# Patient Record
Sex: Female | Born: 1955
Health system: Southern US, Community
[De-identification: ages and names within clinical notes are randomized; demographics above are authoritative.]

## PROBLEM LIST (undated history)

## (undated) DIAGNOSIS — I071 Rheumatic tricuspid insufficiency: Secondary | ICD-10-CM

## (undated) DIAGNOSIS — K649 Unspecified hemorrhoids: Secondary | ICD-10-CM

## (undated) DIAGNOSIS — N9489 Other specified conditions associated with female genital organs and menstrual cycle: Secondary | ICD-10-CM

## (undated) DIAGNOSIS — E876 Hypokalemia: Secondary | ICD-10-CM

## (undated) DIAGNOSIS — E039 Hypothyroidism, unspecified: Secondary | ICD-10-CM

## (undated) DIAGNOSIS — R011 Cardiac murmur, unspecified: Secondary | ICD-10-CM

## (undated) DIAGNOSIS — R931 Abnormal findings on diagnostic imaging of heart and coronary circulation: Secondary | ICD-10-CM

## (undated) DIAGNOSIS — G459 Transient cerebral ischemic attack, unspecified: Secondary | ICD-10-CM

## (undated) DIAGNOSIS — I10 Essential (primary) hypertension: Secondary | ICD-10-CM

## (undated) DIAGNOSIS — E785 Hyperlipidemia, unspecified: Secondary | ICD-10-CM

## (undated) DIAGNOSIS — R0600 Dyspnea, unspecified: Secondary | ICD-10-CM

## (undated) DIAGNOSIS — R002 Palpitations: Secondary | ICD-10-CM

## (undated) DIAGNOSIS — F419 Anxiety disorder, unspecified: Secondary | ICD-10-CM

## (undated) DIAGNOSIS — K439 Ventral hernia without obstruction or gangrene: Secondary | ICD-10-CM

## (undated) DIAGNOSIS — G4730 Sleep apnea, unspecified: Secondary | ICD-10-CM

## (undated) HISTORY — PX: CRYOABLATION: SHX1415

## (undated) HISTORY — DX: Hyperlipidemia, unspecified: E78.5

## (undated) HISTORY — DX: Essential (primary) hypertension: I10

## (undated) HISTORY — DX: Hypokalemia: E87.6

## (undated) HISTORY — DX: Palpitations: R00.2

## (undated) HISTORY — PX: URETHRAL DILATION: SUR417

## (undated) HISTORY — PX: UMBILICAL HERNIA REPAIR: SHX196

## (undated) HISTORY — DX: Anxiety disorder, unspecified: F41.9

## (undated) HISTORY — DX: Transient cerebral ischemic attack, unspecified: G45.9

## (undated) HISTORY — DX: Abnormal findings on diagnostic imaging of heart and coronary circulation: R93.1

## (undated) HISTORY — DX: Cardiac murmur, unspecified: R01.1

---

## 2005-03-11 ENCOUNTER — Ambulatory Visit: Payer: Self-pay | Admitting: Internal Medicine

## 2005-04-06 ENCOUNTER — Ambulatory Visit: Payer: Self-pay | Admitting: Surgery

## 2005-05-11 ENCOUNTER — Ambulatory Visit: Payer: Self-pay | Admitting: Internal Medicine

## 2005-05-31 ENCOUNTER — Ambulatory Visit: Payer: Self-pay | Admitting: Obstetrics and Gynecology

## 2006-09-14 ENCOUNTER — Ambulatory Visit: Payer: Self-pay | Admitting: Internal Medicine

## 2006-09-14 ENCOUNTER — Encounter: Payer: Self-pay | Admitting: Cardiology

## 2006-11-21 ENCOUNTER — Encounter: Payer: Self-pay | Admitting: Cardiology

## 2007-03-09 ENCOUNTER — Encounter: Payer: Self-pay | Admitting: Cardiology

## 2008-06-14 HISTORY — PX: UMBILICAL HERNIA REPAIR: SHX196

## 2009-01-12 DIAGNOSIS — R931 Abnormal findings on diagnostic imaging of heart and coronary circulation: Secondary | ICD-10-CM

## 2009-01-12 DIAGNOSIS — G459 Transient cerebral ischemic attack, unspecified: Secondary | ICD-10-CM

## 2009-01-12 HISTORY — DX: Abnormal findings on diagnostic imaging of heart and coronary circulation: R93.1

## 2009-01-12 HISTORY — DX: Transient cerebral ischemic attack, unspecified: G45.9

## 2009-01-22 ENCOUNTER — Observation Stay: Payer: Self-pay | Admitting: Internal Medicine

## 2009-01-22 ENCOUNTER — Encounter: Payer: Self-pay | Admitting: Cardiology

## 2009-01-23 ENCOUNTER — Emergency Department: Payer: Self-pay | Admitting: Emergency Medicine

## 2009-01-28 ENCOUNTER — Encounter: Payer: Self-pay | Admitting: Cardiology

## 2009-01-30 ENCOUNTER — Ambulatory Visit: Payer: Self-pay | Admitting: Cardiology

## 2009-01-30 DIAGNOSIS — I10 Essential (primary) hypertension: Secondary | ICD-10-CM | POA: Insufficient documentation

## 2009-01-30 DIAGNOSIS — E785 Hyperlipidemia, unspecified: Secondary | ICD-10-CM | POA: Insufficient documentation

## 2009-01-30 DIAGNOSIS — G459 Transient cerebral ischemic attack, unspecified: Secondary | ICD-10-CM | POA: Insufficient documentation

## 2009-01-30 DIAGNOSIS — E7800 Pure hypercholesterolemia, unspecified: Secondary | ICD-10-CM

## 2009-01-30 DIAGNOSIS — I499 Cardiac arrhythmia, unspecified: Secondary | ICD-10-CM | POA: Insufficient documentation

## 2009-02-01 ENCOUNTER — Telehealth: Payer: Self-pay | Admitting: Nurse Practitioner

## 2009-02-03 ENCOUNTER — Telehealth: Payer: Self-pay | Admitting: Cardiology

## 2009-02-05 ENCOUNTER — Encounter: Payer: Self-pay | Admitting: Cardiology

## 2009-02-05 ENCOUNTER — Telehealth (INDEPENDENT_AMBULATORY_CARE_PROVIDER_SITE_OTHER): Payer: Self-pay | Admitting: Physician Assistant

## 2009-02-06 ENCOUNTER — Encounter: Payer: Self-pay | Admitting: Cardiology

## 2009-02-11 ENCOUNTER — Telehealth (INDEPENDENT_AMBULATORY_CARE_PROVIDER_SITE_OTHER): Payer: Self-pay | Admitting: *Deleted

## 2009-02-12 ENCOUNTER — Ambulatory Visit: Payer: Self-pay | Admitting: Internal Medicine

## 2009-02-14 ENCOUNTER — Telehealth: Payer: Self-pay | Admitting: Cardiology

## 2009-02-14 ENCOUNTER — Ambulatory Visit: Payer: Self-pay | Admitting: Cardiovascular Disease

## 2009-02-14 DIAGNOSIS — R232 Flushing: Secondary | ICD-10-CM

## 2009-02-21 ENCOUNTER — Emergency Department (HOSPITAL_COMMUNITY): Admission: EM | Admit: 2009-02-21 | Discharge: 2009-02-22 | Payer: Self-pay | Admitting: Emergency Medicine

## 2009-02-22 ENCOUNTER — Telehealth: Payer: Self-pay | Admitting: Adult Health

## 2009-02-25 ENCOUNTER — Telehealth: Payer: Self-pay | Admitting: Cardiology

## 2009-03-04 ENCOUNTER — Encounter: Admission: RE | Admit: 2009-03-04 | Discharge: 2009-03-04 | Payer: Self-pay | Admitting: Neurology

## 2009-03-07 ENCOUNTER — Ambulatory Visit: Payer: Self-pay

## 2009-03-07 ENCOUNTER — Encounter: Payer: Self-pay | Admitting: Cardiology

## 2009-03-10 ENCOUNTER — Ambulatory Visit: Payer: Self-pay | Admitting: Cardiology

## 2009-03-11 LAB — CONVERTED CEMR LAB
BUN: 12 mg/dL (ref 6–23)
CO2: 23 meq/L (ref 19–32)
Chloride: 103 meq/L (ref 96–112)
Creatinine, Ser: 0.77 mg/dL (ref 0.40–1.20)

## 2009-03-13 ENCOUNTER — Ambulatory Visit: Payer: Self-pay | Admitting: Internal Medicine

## 2009-03-19 ENCOUNTER — Ambulatory Visit (HOSPITAL_BASED_OUTPATIENT_CLINIC_OR_DEPARTMENT_OTHER): Admission: RE | Admit: 2009-03-19 | Discharge: 2009-03-19 | Payer: Self-pay | Admitting: Internal Medicine

## 2009-03-23 ENCOUNTER — Ambulatory Visit: Payer: Self-pay | Admitting: Internal Medicine

## 2009-03-28 ENCOUNTER — Telehealth (INDEPENDENT_AMBULATORY_CARE_PROVIDER_SITE_OTHER): Payer: Self-pay | Admitting: *Deleted

## 2010-02-11 ENCOUNTER — Ambulatory Visit: Payer: Self-pay | Admitting: Internal Medicine

## 2010-09-18 LAB — POCT I-STAT, CHEM 8
BUN: 10 mg/dL (ref 6–23)
Calcium, Ion: 1.08 mmol/L — ABNORMAL LOW (ref 1.12–1.32)
Chloride: 98 meq/L (ref 96–112)
Creatinine, Ser: 0.7 mg/dL (ref 0.4–1.2)
Glucose, Bld: 117 mg/dL — ABNORMAL HIGH (ref 70–99)
HCT: 41 % (ref 36.0–46.0)
Hemoglobin: 13.9 g/dL (ref 12.0–15.0)
Potassium: 2.8 meq/L — ABNORMAL LOW (ref 3.5–5.1)
Sodium: 136 mEq/L (ref 135–145)
TCO2: 30 mmol/L (ref 0–100)

## 2010-09-18 LAB — CBC
Hemoglobin: 13.7 g/dL (ref 12.0–15.0)
MCHC: 34.2 g/dL (ref 30.0–36.0)
MCV: 97.1 fL (ref 78.0–100.0)
RDW: 12.9 % (ref 11.5–15.5)

## 2010-09-18 LAB — URINALYSIS, ROUTINE W REFLEX MICROSCOPIC
Glucose, UA: NEGATIVE mg/dL
Hgb urine dipstick: NEGATIVE
Protein, ur: NEGATIVE mg/dL
pH: 5.5 (ref 5.0–8.0)

## 2010-09-18 LAB — DIFFERENTIAL
Basophils Absolute: 0.1 10*3/uL (ref 0.0–0.1)
Basophils Relative: 1 % (ref 0–1)
Eosinophils Absolute: 0.1 10*3/uL (ref 0.0–0.7)
Monocytes Absolute: 0.5 10*3/uL (ref 0.1–1.0)
Neutro Abs: 5 10*3/uL (ref 1.7–7.7)
Neutrophils Relative %: 63 % (ref 43–77)

## 2010-09-18 LAB — URINE MICROSCOPIC-ADD ON

## 2010-09-18 LAB — POCT CARDIAC MARKERS
CKMB, poc: <1 ng/mL — ABNORMAL LOW (ref 1.0–8.0)
Myoglobin, poc: 40.7 ng/mL (ref 12–200)
Troponin i, poc: <0.05 ng/mL (ref 0.00–0.09)

## 2010-09-18 LAB — URINE CULTURE

## 2010-11-30 ENCOUNTER — Encounter: Payer: Self-pay | Admitting: Cardiovascular Disease

## 2011-05-18 IMAGING — CR DG CHEST 2V
1 series · 2 of 2 positions shown · non-contrast
Comparison: none

REASON FOR EXAM: elevated blood pressure / blurred vision
COMMENTS:

[Series 1: view not recorded · 0.17mm/px · 2 of 2 slices shown]
[im 1/2]
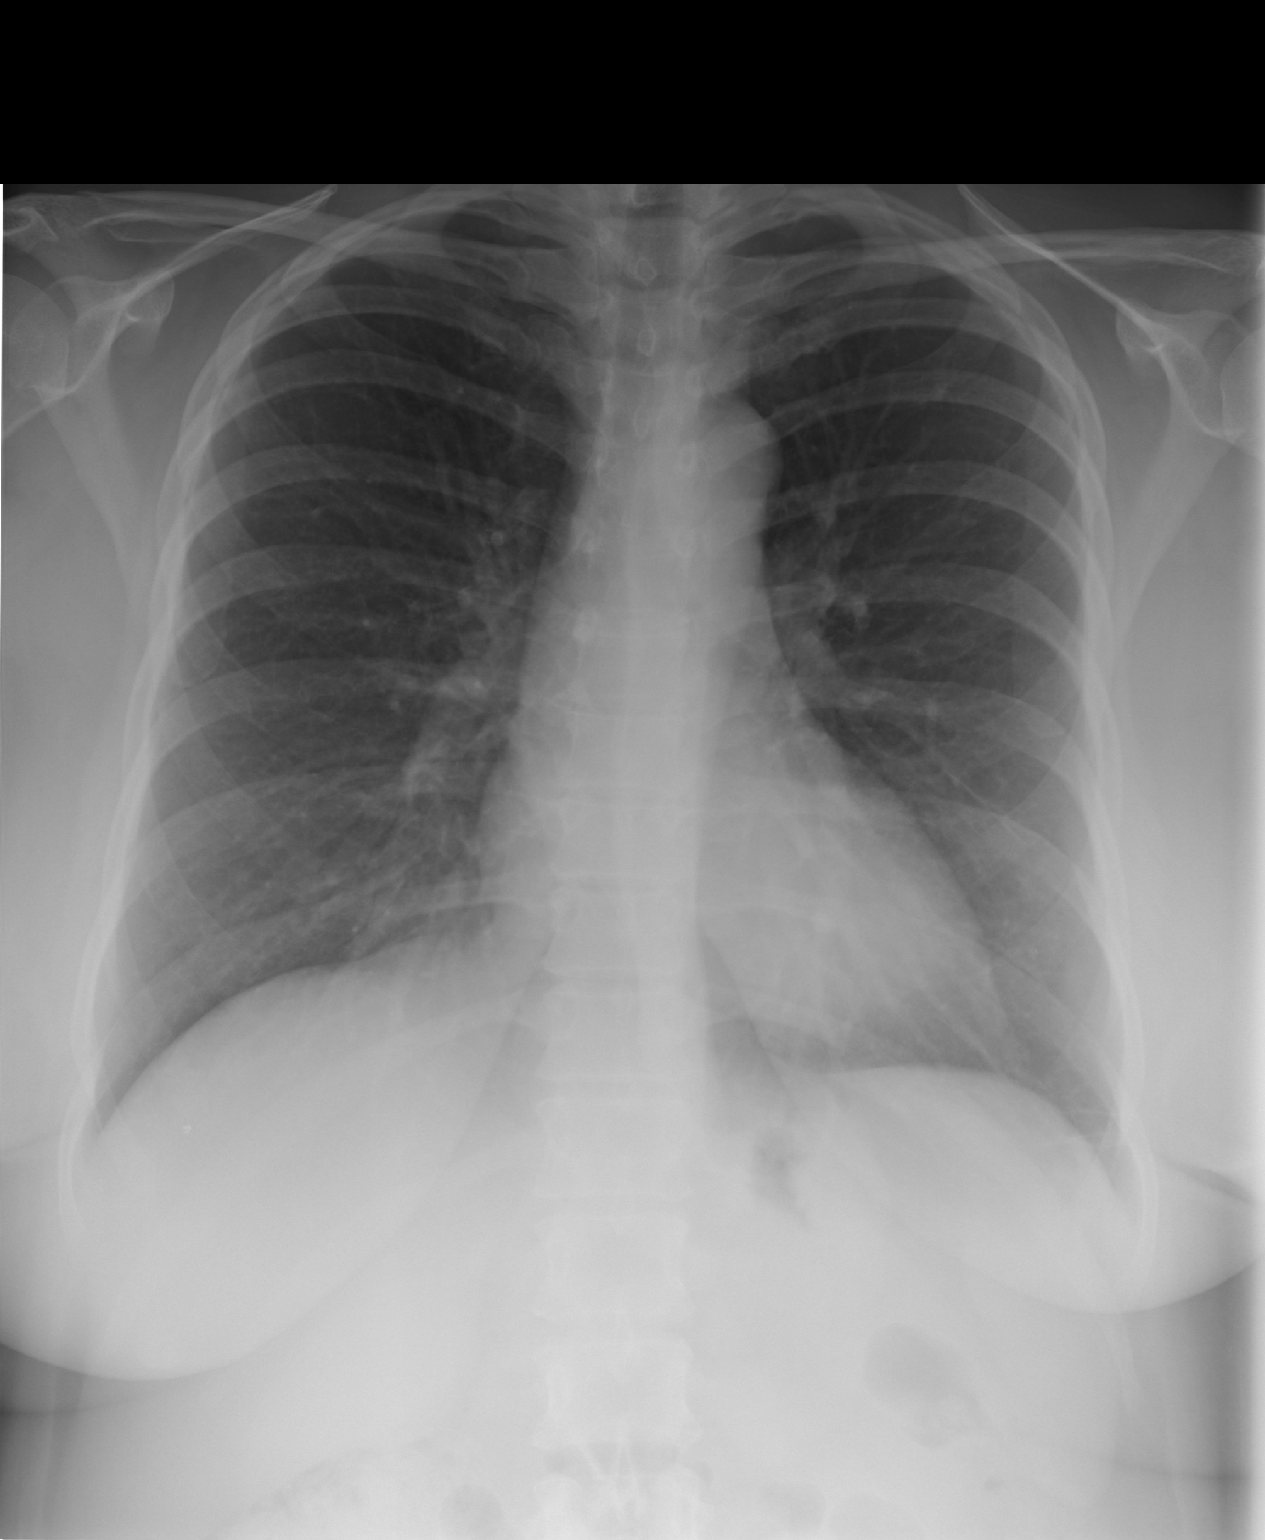
[im 2/2]
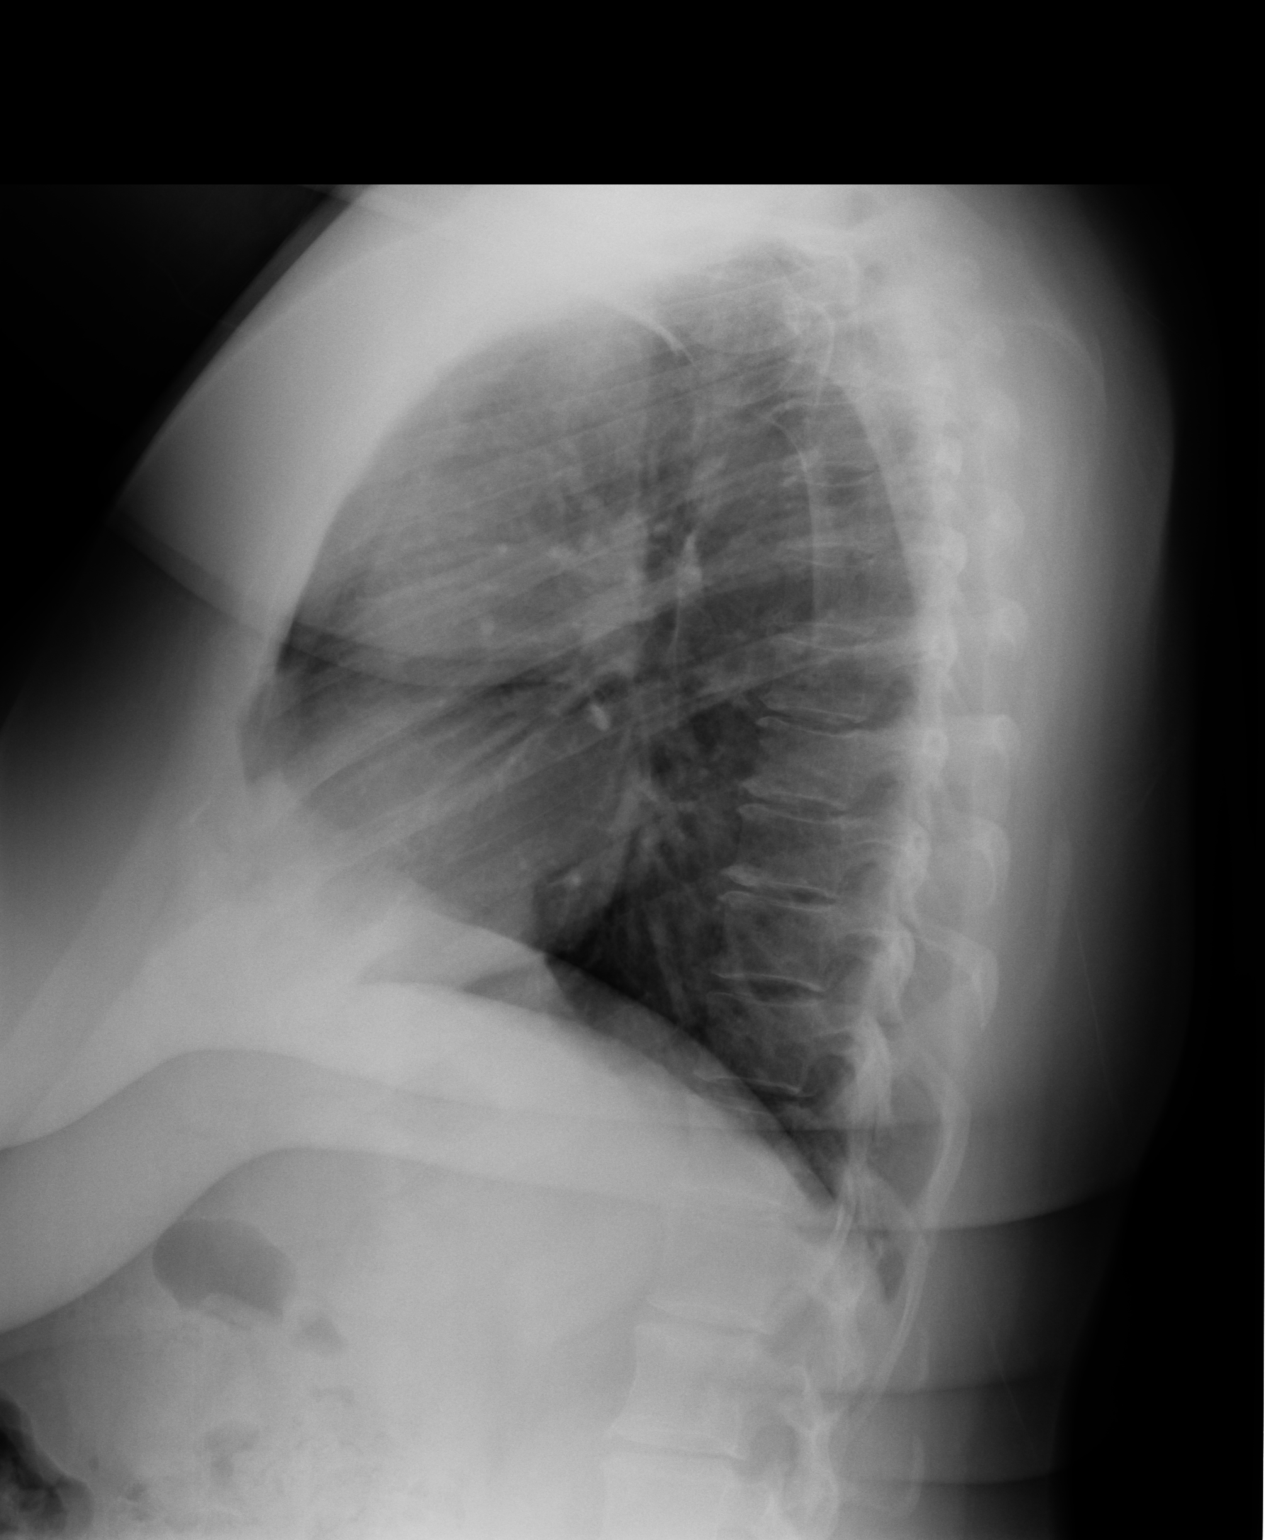

[2 of 2 positions shown; findings below may reference images not displayed]

PROCEDURE:     DXR - DXR CHEST PA (OR AP) AND LATERAL  - January 22, 2009 [DATE]

RESULT:     There is no previous exam for comparison area

The lungs are clear. The heart and pulmonary vessels are normal. The bony
and mediastinal structures are unremarkable. There is no effusion. There is
no pneumothorax or evidence of congestive failure.
IMPRESSION: No acute cardiopulmonary disease.

## 2012-06-15 ENCOUNTER — Encounter: Payer: Self-pay | Admitting: Internal Medicine

## 2012-06-15 ENCOUNTER — Ambulatory Visit (INDEPENDENT_AMBULATORY_CARE_PROVIDER_SITE_OTHER): Payer: PRIVATE HEALTH INSURANCE | Admitting: Internal Medicine

## 2012-06-15 ENCOUNTER — Other Ambulatory Visit (HOSPITAL_COMMUNITY)
Admission: RE | Admit: 2012-06-15 | Discharge: 2012-06-15 | Disposition: A | Discharge status: Home / Self Care | Payer: PRIVATE HEALTH INSURANCE | Source: Ambulatory Visit | Attending: Internal Medicine | Admitting: Internal Medicine

## 2012-06-15 VITALS — BP 120/84 | HR 77 | Temp 97.7°F | Ht 63.5 in | Wt 224.5 lb

## 2012-06-15 DIAGNOSIS — R5383 Other fatigue: Secondary | ICD-10-CM

## 2012-06-15 DIAGNOSIS — Z1151 Encounter for screening for human papillomavirus (HPV): Secondary | ICD-10-CM | POA: Insufficient documentation

## 2012-06-15 DIAGNOSIS — Z01419 Encounter for gynecological examination (general) (routine) without abnormal findings: Secondary | ICD-10-CM | POA: Insufficient documentation

## 2012-06-15 DIAGNOSIS — R5381 Other malaise: Secondary | ICD-10-CM

## 2012-06-15 DIAGNOSIS — G4730 Sleep apnea, unspecified: Secondary | ICD-10-CM

## 2012-06-15 DIAGNOSIS — Z139 Encounter for screening, unspecified: Secondary | ICD-10-CM

## 2012-06-15 DIAGNOSIS — G459 Transient cerebral ischemic attack, unspecified: Secondary | ICD-10-CM

## 2012-06-15 DIAGNOSIS — I499 Cardiac arrhythmia, unspecified: Secondary | ICD-10-CM

## 2012-06-15 DIAGNOSIS — R232 Flushing: Secondary | ICD-10-CM

## 2012-06-15 DIAGNOSIS — E785 Hyperlipidemia, unspecified: Secondary | ICD-10-CM

## 2012-06-15 DIAGNOSIS — I10 Essential (primary) hypertension: Secondary | ICD-10-CM

## 2012-06-15 LAB — COMPREHENSIVE METABOLIC PANEL
AST: 18 U/L (ref 0–37)
Albumin: 3.8 g/dL (ref 3.5–5.2)
Alkaline Phosphatase: 51 U/L (ref 39–117)
BUN: 18 mg/dL (ref 6–23)
Glucose, Bld: 118 mg/dL — ABNORMAL HIGH (ref 70–99)
Potassium: 4.2 mEq/L (ref 3.5–5.1)
Sodium: 137 mEq/L (ref 135–145)
Total Bilirubin: 0.6 mg/dL (ref 0.3–1.2)
Total Protein: 7.3 g/dL (ref 6.0–8.3)

## 2012-06-15 LAB — LIPID PANEL
Cholesterol: 239 mg/dL — ABNORMAL HIGH (ref 0–200)
HDL: 38.7 mg/dL — ABNORMAL LOW (ref >39.00)
Total CHOL/HDL Ratio: 6
VLDL: 71.8 mg/dL — ABNORMAL HIGH (ref 0.0–40.0)

## 2012-06-15 LAB — CBC WITH DIFFERENTIAL/PLATELET
Eosinophils Absolute: 0.3 10*3/uL (ref 0.0–0.7)
Eosinophils Relative: 4.7 % (ref 0.0–5.0)
HCT: 36.3 % (ref 36.0–46.0)
Lymphs Abs: 2.2 10*3/uL (ref 0.7–4.0)
MCHC: 33.9 g/dL (ref 30.0–36.0)
MCV: 94.3 fl (ref 78.0–100.0)
Monocytes Absolute: 0.5 10*3/uL (ref 0.1–1.0)
Neutrophils Relative %: 58.5 % (ref 43.0–77.0)
Platelets: 258 10*3/uL (ref 150.0–400.0)

## 2012-06-15 LAB — TSH: TSH: 3.04 u[IU]/mL (ref 0.35–5.50)

## 2012-06-16 ENCOUNTER — Telehealth: Payer: Self-pay | Admitting: Internal Medicine

## 2012-06-16 ENCOUNTER — Other Ambulatory Visit: Payer: Self-pay | Admitting: *Deleted

## 2012-06-16 ENCOUNTER — Ambulatory Visit: Payer: PRIVATE HEALTH INSURANCE

## 2012-06-16 DIAGNOSIS — Z139 Encounter for screening, unspecified: Secondary | ICD-10-CM

## 2012-06-16 DIAGNOSIS — R7989 Other specified abnormal findings of blood chemistry: Secondary | ICD-10-CM

## 2012-06-16 NOTE — Telephone Encounter (Signed)
Can I add a1c to her labs drawn 06/15/12.  a1c 790.6.  Thanks. Let me know if unable to add and I will schedule for in the future.

## 2012-06-17 ENCOUNTER — Encounter: Payer: Self-pay | Admitting: Internal Medicine

## 2012-06-17 DIAGNOSIS — G4730 Sleep apnea, unspecified: Secondary | ICD-10-CM | POA: Insufficient documentation

## 2012-06-17 NOTE — Assessment & Plan Note (Signed)
Discussed with her today regarding the importance of BiPAP regularly.  Discussed if a problem with dryness - we could discuss with company the need for reevaluation.  Follow.

## 2012-06-17 NOTE — Progress Notes (Signed)
  Subjective:    Patient ID: Margaret Mercado, female    DOB: [birth year], 56 y.o.   MRN: [id number]  HPI 57 year old female with past history of hypercholesterolemia, hypertension, sleep apnea requiring BiPAP and presumed TIA.  She comes in today to follow up on these issues as well as for a complete physical exam.  She states she has been doing well.  No chest pain or tightness.  Breathing stable.  No acid reflux.  Bowels stable.  Blood pressure has been doing well.  Just takes 1/2 of a 2.5mg  Bystolic.  Plans to get more serious about her exercise.    Past Medical History  Diagnosis Date  . Hypertension     Edema with Norvasc. Unable to tolerate Lisinopril/ HCTZ.  Marland Kitchen Hyperlipidemia   . TIA (transient ischemic attack) 01/2009    CT and MRI of head showed no evidence for stroke. Carotid dopplers showed no significant plaque  . Abnormal echocardiogram 01/2009    Mild LVH, EF >55%, no regional wall motion abnormalities, normal RV, mild aortic insufficiency, no aortic stenosis.  . Hypokalemia     Manifested by lip and finger tingling  . Anxiety   . Palpitations     Event monitor 01/2009 with no significant arrhythmias    Review of Systems Patient denies any headache, lightheadedness or dizziness.  No sinus or allergy symptoms.  No chest pain, tightness or palpitations.  No increased shortness of breath, cough or congestion.  No nausea or vomiting.  No acid reflux.  No abdominal pain or cramping.  No bowel change, such as diarrhea, constipation, BRBPR or melana.  No urine change.        Objective:   Physical Exam Filed Vitals:   06/15/12 0914  BP: 120/84  Pulse: 77  Temp: 97.7 F (36.5 C)   Blood pressure recheck:  112/72 left and 114/13 right  57 year old female in no acute distress.   HEENT:  Nares- clear.  Oropharynx - without lesions. NECK:  Supple.  Nontender.  No audible bruit.  HEART:  Appears to be regular. LUNGS:  No crackles or wheezing audible.  Respirations even and  unlabored.  RADIAL PULSE:  Equal bilaterally.    BREASTS:  No nipple discharge or nipple retraction present.  Could not appreciate any distinct nodules or axillary adenopathy.  ABDOMEN:  Soft, nontender.  Bowel sounds present and normal.  No audible abdominal bruit.  GU:  Normal external genitalia.  Vaginal vault without lesions.  Cervix identified.  Pap performed. Could not appreciate any adnexal masses or tenderness.   RECTAL:  Heme negative.   EXTREMITIES:  No increased edema present.  DP pulses palpable and equal bilaterally.          Assessment & Plan:  CARDIOVASCULAR.  Currently asymptomatic.    INCREASED PSYCHOSOCIAL STRESSORS.  On zoloft.  Doing well.  Follow.    HEALTH MAINTENANCE.  Physical today.  Pap performed.  Schedule mammogram.  IFOB.  Discussed the need for colon cancer screening.  Will notify me when agreeable for referral for colonoscopy.

## 2012-06-17 NOTE — Assessment & Plan Note (Signed)
No reoccurring problems.  Continue daily aspirin.   

## 2012-06-17 NOTE — Assessment & Plan Note (Signed)
Low cholesterol diet and exercise.  Check lipid panel.   

## 2012-06-17 NOTE — Assessment & Plan Note (Signed)
Blood pressure doing much better.  Same medication regimen.  Follow.  Check metabolic panel.  

## 2012-06-19 ENCOUNTER — Telehealth: Payer: Self-pay | Admitting: Internal Medicine

## 2012-06-19 DIAGNOSIS — E7800 Pure hypercholesterolemia, unspecified: Secondary | ICD-10-CM

## 2012-06-19 DIAGNOSIS — R739 Hyperglycemia, unspecified: Secondary | ICD-10-CM

## 2012-06-19 DIAGNOSIS — I10 Essential (primary) hypertension: Secondary | ICD-10-CM

## 2012-06-19 NOTE — Telephone Encounter (Signed)
Pt notified of lab results and need for follow up labs prior to her next appt.  Please put pt on lab schedule for 12/13/12 - 8:00.  Pt aware of appt - just need to put on schedule.

## 2012-06-20 NOTE — Telephone Encounter (Signed)
Appointment made

## 2012-07-11 ENCOUNTER — Other Ambulatory Visit: Payer: Self-pay | Admitting: *Deleted

## 2012-07-11 NOTE — Telephone Encounter (Signed)
Pt is saying she contacted her pharmacy and they have not received her rx yet.

## 2012-07-12 ENCOUNTER — Other Ambulatory Visit: Payer: Self-pay | Admitting: Internal Medicine

## 2012-07-12 MED ORDER — HYDROCHLOROTHIAZIDE 25 MG PO TABS: ORAL_TABLET | ORAL | Status: DC

## 2012-07-12 NOTE — Telephone Encounter (Signed)
Pt states [facility] on [address] has faxed over 4 times for prescription refill for hydrochlorothiazide and sertraline  and pt was told yesterday by [place] that we have sent it back.  [facility] is still saying as of today that they have not received it.  Pt is asking [place] to resend it even though we have already sent it.

## 2012-07-12 NOTE — Telephone Encounter (Signed)
Sent in to pharmacy.  

## 2012-07-13 ENCOUNTER — Ambulatory Visit: Payer: Self-pay | Admitting: Internal Medicine

## 2012-07-13 NOTE — Telephone Encounter (Signed)
Sent in to pharmacy.  

## 2012-07-25 ENCOUNTER — Encounter: Payer: Self-pay | Admitting: Internal Medicine

## 2012-08-03 ENCOUNTER — Other Ambulatory Visit: Payer: Self-pay | Admitting: *Deleted

## 2012-08-07 ENCOUNTER — Other Ambulatory Visit: Payer: Self-pay | Admitting: *Deleted

## 2012-08-07 MED ORDER — SERTRALINE HCL 25 MG PO TABS: 25.0000 mg | ORAL_TABLET | Freq: Three times a day (TID) | ORAL | Status: DC

## 2012-08-07 NOTE — Telephone Encounter (Signed)
Patient called office stating that she is completely out of Zoloft 25 mg 3/daily.

## 2012-10-23 ENCOUNTER — Other Ambulatory Visit: Payer: Self-pay | Admitting: Internal Medicine

## 2012-12-13 ENCOUNTER — Other Ambulatory Visit: Payer: PRIVATE HEALTH INSURANCE

## 2012-12-18 ENCOUNTER — Ambulatory Visit: Payer: PRIVATE HEALTH INSURANCE | Admitting: Internal Medicine

## 2012-12-19 ENCOUNTER — Ambulatory Visit: Payer: PRIVATE HEALTH INSURANCE | Admitting: Internal Medicine

## 2012-12-20 ENCOUNTER — Ambulatory Visit: Payer: PRIVATE HEALTH INSURANCE | Admitting: Internal Medicine

## 2013-02-09 ENCOUNTER — Ambulatory Visit: Payer: PRIVATE HEALTH INSURANCE | Admitting: Internal Medicine

## 2013-03-01 ENCOUNTER — Ambulatory Visit: Payer: Self-pay | Admitting: Internal Medicine

## 2013-03-22 ENCOUNTER — Other Ambulatory Visit: Payer: Self-pay | Admitting: Internal Medicine

## 2013-03-22 NOTE — Telephone Encounter (Signed)
appt 03/28/13

## 2013-03-28 ENCOUNTER — Ambulatory Visit: Payer: Self-pay | Admitting: Internal Medicine

## 2013-04-18 ENCOUNTER — Ambulatory Visit: Payer: Self-pay | Admitting: Internal Medicine

## 2013-05-02 ENCOUNTER — Ambulatory Visit: Payer: Self-pay | Admitting: Internal Medicine

## 2013-05-23 ENCOUNTER — Ambulatory Visit: Payer: Self-pay | Admitting: Internal Medicine

## 2013-06-01 ENCOUNTER — Ambulatory Visit: Payer: Self-pay | Admitting: Internal Medicine

## 2013-06-14 ENCOUNTER — Other Ambulatory Visit: Payer: Self-pay | Admitting: Internal Medicine

## 2013-06-15 NOTE — Telephone Encounter (Signed)
Pt has appt 07/06/13, but has cancelled 7 appointments and 1 no show. Last OV 06/15/12

## 2013-06-15 NOTE — Telephone Encounter (Signed)
I refilled her zoloft x 1 only.  She needs to keep her 07/08/13 appt.  Thanks.

## 2013-07-03 ENCOUNTER — Other Ambulatory Visit: Payer: Self-pay | Admitting: Internal Medicine

## 2013-07-03 ENCOUNTER — Telehealth: Payer: Self-pay | Admitting: Internal Medicine

## 2013-07-03 DIAGNOSIS — R739 Hyperglycemia, unspecified: Secondary | ICD-10-CM

## 2013-07-03 DIAGNOSIS — E785 Hyperlipidemia, unspecified: Secondary | ICD-10-CM

## 2013-07-03 DIAGNOSIS — I10 Essential (primary) hypertension: Secondary | ICD-10-CM

## 2013-07-03 NOTE — Progress Notes (Signed)
Labs ordered.

## 2013-07-03 NOTE — Telephone Encounter (Signed)
I have placed orders for the labs.  It has been over one year since she has been seen.  Needs labs within the next 1-2 weeks.   Thanks.

## 2013-07-03 NOTE — Telephone Encounter (Signed)
Pt call to r/s her appointment to 3/5 and stated she needed to get labs prior to the appointment is this ok there are no lab orders in system

## 2013-07-05 NOTE — Telephone Encounter (Signed)
Left message for pt to call office

## 2013-07-06 ENCOUNTER — Ambulatory Visit: Payer: Self-pay | Admitting: Internal Medicine

## 2013-07-10 NOTE — Telephone Encounter (Signed)
Left message on cell phone for pt to call office °

## 2013-07-27 NOTE — Telephone Encounter (Signed)
Appointment 08/10/13  Mailed appointment to pt

## 2013-08-07 ENCOUNTER — Other Ambulatory Visit: Payer: Self-pay | Admitting: Internal Medicine

## 2013-08-07 NOTE — Telephone Encounter (Signed)
Please call pt and make sure she is aware that if she does not keep this next appt - no more refills.  Only refill x 1.

## 2013-08-07 NOTE — Telephone Encounter (Signed)
Pt canceled last 8 appointments, including 07/06/13 visit that had been scheduled for refills. Refill? Appt now 08/16/13

## 2013-08-08 NOTE — Telephone Encounter (Signed)
Home number, mailbox full. Cell number, mailbox full, unable to leave VM on either number. Mailed letter, notifying of need to keep upcoming appointment.

## 2013-08-10 ENCOUNTER — Other Ambulatory Visit: Payer: PRIVATE HEALTH INSURANCE

## 2013-08-15 ENCOUNTER — Other Ambulatory Visit (INDEPENDENT_AMBULATORY_CARE_PROVIDER_SITE_OTHER): Payer: PRIVATE HEALTH INSURANCE

## 2013-08-15 DIAGNOSIS — I10 Essential (primary) hypertension: Secondary | ICD-10-CM

## 2013-08-15 DIAGNOSIS — E785 Hyperlipidemia, unspecified: Secondary | ICD-10-CM

## 2013-08-15 DIAGNOSIS — Z139 Encounter for screening, unspecified: Secondary | ICD-10-CM

## 2013-08-15 DIAGNOSIS — R7309 Other abnormal glucose: Secondary | ICD-10-CM | POA: Diagnosis not present

## 2013-08-15 DIAGNOSIS — R739 Hyperglycemia, unspecified: Secondary | ICD-10-CM

## 2013-08-15 LAB — URINALYSIS, ROUTINE W REFLEX MICROSCOPIC
Bilirubin Urine: NEGATIVE
Hgb urine dipstick: NEGATIVE
Ketones, ur: NEGATIVE
NITRITE: NEGATIVE
Total Protein, Urine: NEGATIVE
URINE GLUCOSE: NEGATIVE
UROBILINOGEN UA: 0.2 (ref 0.0–1.0)
pH: 5.5 (ref 5.0–8.0)

## 2013-08-15 LAB — CBC WITH DIFFERENTIAL/PLATELET
Basophils Absolute: 0 10*3/uL (ref 0.0–0.1)
Basophils Relative: 0.7 % (ref 0.0–3.0)
EOS ABS: 0.2 10*3/uL (ref 0.0–0.7)
Eosinophils Relative: 3.6 % (ref 0.0–5.0)
HCT: 39 % (ref 36.0–46.0)
Hemoglobin: 13 g/dL (ref 12.0–15.0)
Lymphocytes Relative: 28.7 % (ref 12.0–46.0)
Lymphs Abs: 1.8 10*3/uL (ref 0.7–4.0)
MCHC: 33.4 g/dL (ref 30.0–36.0)
MCV: 95.7 fl (ref 78.0–100.0)
MONO ABS: 0.4 10*3/uL (ref 0.1–1.0)
Monocytes Relative: 7.1 % (ref 3.0–12.0)
NEUTROS PCT: 59.9 % (ref 43.0–77.0)
Neutro Abs: 3.7 10*3/uL (ref 1.4–7.7)
Platelets: 253 10*3/uL (ref 150.0–400.0)
RBC: 4.07 Mil/uL (ref 3.87–5.11)
RDW: 13.3 % (ref 11.5–14.6)
WBC: 6.2 10*3/uL (ref 4.5–10.5)

## 2013-08-15 LAB — LIPID PANEL
CHOL/HDL RATIO: 6
Cholesterol: 232 mg/dL — ABNORMAL HIGH (ref 0–200)
HDL: 40.9 mg/dL (ref >39.00)
LDL CALC: 141 mg/dL — AB (ref 0–99)
TRIGLYCERIDES: 252 mg/dL — AB (ref 0.0–149.0)
VLDL: 50.4 mg/dL — ABNORMAL HIGH (ref 0.0–40.0)

## 2013-08-15 LAB — COMPREHENSIVE METABOLIC PANEL
ALBUMIN: 3.9 g/dL (ref 3.5–5.2)
ALT: 20 U/L (ref 0–35)
AST: 17 U/L (ref 0–37)
Alkaline Phosphatase: 48 U/L (ref 39–117)
BUN: 16 mg/dL (ref 6–23)
CO2: 25 mEq/L (ref 19–32)
Calcium: 9.2 mg/dL (ref 8.4–10.5)
Chloride: 103 mEq/L (ref 96–112)
Creatinine, Ser: 0.7 mg/dL (ref 0.4–1.2)
GFR: 87.56 mL/min (ref >60.00)
Glucose, Bld: 108 mg/dL — ABNORMAL HIGH (ref 70–99)
POTASSIUM: 4 meq/L (ref 3.5–5.1)
SODIUM: 138 meq/L (ref 135–145)
Total Bilirubin: 0.8 mg/dL (ref 0.3–1.2)
Total Protein: 6.9 g/dL (ref 6.0–8.3)

## 2013-08-15 LAB — MICROALBUMIN / CREATININE URINE RATIO
CREATININE, U: 145.1 mg/dL
Microalb Creat Ratio: 0.9 mg/g (ref 0.0–30.0)
Microalb, Ur: 1.3 mg/dL (ref 0.0–1.9)

## 2013-08-15 LAB — TSH: TSH: 3.86 u[IU]/mL (ref 0.35–5.50)

## 2013-08-15 LAB — HEMOGLOBIN A1C: Hgb A1c MFr Bld: 6.2 % (ref 4.6–6.5)

## 2013-08-16 ENCOUNTER — Encounter: Payer: Self-pay | Admitting: Internal Medicine

## 2013-08-16 ENCOUNTER — Ambulatory Visit (INDEPENDENT_AMBULATORY_CARE_PROVIDER_SITE_OTHER): Payer: PRIVATE HEALTH INSURANCE | Admitting: Internal Medicine

## 2013-08-16 VITALS — BP 130/78 | HR 69 | Temp 97.9°F | Ht 63.5 in | Wt 222.2 lb

## 2013-08-16 DIAGNOSIS — Z1239 Encounter for other screening for malignant neoplasm of breast: Secondary | ICD-10-CM

## 2013-08-16 DIAGNOSIS — R1032 Left lower quadrant pain: Secondary | ICD-10-CM

## 2013-08-16 DIAGNOSIS — K439 Ventral hernia without obstruction or gangrene: Secondary | ICD-10-CM

## 2013-08-16 DIAGNOSIS — G4730 Sleep apnea, unspecified: Secondary | ICD-10-CM

## 2013-08-16 DIAGNOSIS — R8281 Pyuria: Secondary | ICD-10-CM

## 2013-08-16 DIAGNOSIS — E785 Hyperlipidemia, unspecified: Secondary | ICD-10-CM

## 2013-08-16 DIAGNOSIS — I10 Essential (primary) hypertension: Secondary | ICD-10-CM

## 2013-08-16 DIAGNOSIS — R82998 Other abnormal findings in urine: Secondary | ICD-10-CM

## 2013-08-16 DIAGNOSIS — G459 Transient cerebral ischemic attack, unspecified: Secondary | ICD-10-CM

## 2013-08-16 NOTE — Progress Notes (Signed)
Pre-visit discussion using our clinic review tool. No additional management support is needed unless otherwise documented below in the visit note.  

## 2013-08-16 NOTE — Progress Notes (Signed)
Subjective:    Patient ID: Margaret Mercado, female    DOB: 11/20/1955, 58 y.o.   MRN: [id number]  HPI 58 year old female with past history of hypercholesterolemia, hypertension, sleep apnea requiring BiPAP and presumed TIA.  She comes in today for a scheduled follow up.  She states she has been doing well.  No chest pain or tightness.  Breathing stable.  No acid reflux.  Bowels stable.  Blood pressure has been doing well.  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  Plans to get more serious about her exercise.  She reports increased stress.  Feels she is handling things relatively well.  Taking zoloft.  Also taking red yeast rice for her cholesterol.  Does not want to take prescription medication.  She is also concerned regarding an abdominal bulge.  Persistent.  Feels bigger.  Would like evaluated.  Having some left lower quadrant pain.  Persistent.  Has had (per her report) an ovarian cyst in the past.  No bowel change.     Past Medical History  Diagnosis Date  . Hypertension     Edema with Norvasc. Unable to tolerate Lisinopril/ HCTZ.  Marland Kitchen Hyperlipidemia   . TIA (transient ischemic attack) 01/2009    CT and MRI of head showed no evidence for stroke. Carotid dopplers showed no significant plaque  . Abnormal echocardiogram 01/2009    Mild LVH, EF >55%, no regional wall motion abnormalities, normal RV, mild aortic insufficiency, no aortic stenosis.  . Hypokalemia     Manifested by lip and finger tingling  . Anxiety   . Palpitations     Event monitor 01/2009 with no significant arrhythmias    Current Outpatient Prescriptions on File Prior to Visit  Medication Sig Dispense Refill  . aspirin 81 MG EC tablet Take 81 mg by mouth daily.        Marland Kitchen BYSTOLIC 2.5 MG tablet TAKE 1 TABLET BY MOUTH EVERY DAY  30 tablet  5  . fish oil-omega-3 fatty acids 1000 MG capsule Take 0.75 capsules by mouth daily.        . hydrochlorothiazide (HYDRODIURIL) 25 MG tablet Take 1/2 tablet by mouth daily  15 tablet  5  . LORazepam  (ATIVAN) 1 MG tablet Take 0.5 mg by mouth at bedtime as needed.       . nebivolol (BYSTOLIC) 5 MG tablet Take 0.5 tablets (2.5 mg total) by mouth daily. Must keep 08/16/13 appointment for any further refills  15 tablet  0  . sertraline (ZOLOFT) 25 MG tablet TAKE 1 TABLET BY MOUTH THREE TIMES DAILY  90 tablet  0   No current facility-administered medications on file prior to visit.    Review of Systems Patient denies any headache, lightheadedness or dizziness.  No sinus or allergy symptoms.  No chest pain, tightness or palpitations.  No increased shortness of breath, cough or congestion.  No nausea or vomiting.  No acid reflux.  Left lower quadrant pain as outlined.   No bowel change, such as diarrhea, constipation, BRBPR or melana.  Some increased urinary frequency.       Objective:   Physical Exam  Filed Vitals:   08/16/13 1359  BP: 130/78  Pulse: 69  Temp: 97.9 F (36.6 C)   Blood pressure recheck:  55/56  58 year old female in no acute distress.   HEENT:  Nares- clear.  Oropharynx - without lesions. NECK:  Supple.  Nontender.  No audible bruit.  HEART:  Appears to be regular. LUNGS:  No crackles  or wheezing audible.  Respirations even and unlabored.  RADIAL PULSE:  Equal bilaterally.    ABDOMEN:  Soft.  Minimal tenderness to palpation over the left lower quadrant.   Bowel sounds present and normal.  No audible abdominal bruit.  Changes c/w hernia.  Non tender.    EXTREMITIES:  No increased edema present.  DP pulses palpable and equal bilaterally.          Assessment & Plan:  CARDIOVASCULAR.  Currently asymptomatic.    INCREASED PSYCHOSOCIAL STRESSORS.  On zoloft.   Follow.    HEALTH MAINTENANCE.  Overdue a physical.   Schedule.  Schedule mammogram.  Have discussed the need for colon cancer screening.  Will notify me when agreeable for referral for colonoscopy.

## 2013-08-17 ENCOUNTER — Other Ambulatory Visit: Payer: Self-pay | Admitting: Internal Medicine

## 2013-08-17 LAB — CULTURE, URINE COMPREHENSIVE
COLONY COUNT: NO GROWTH
ORGANISM ID, BACTERIA: NO GROWTH

## 2013-08-19 ENCOUNTER — Encounter: Payer: Self-pay | Admitting: Internal Medicine

## 2013-08-19 DIAGNOSIS — R8281 Pyuria: Secondary | ICD-10-CM | POA: Insufficient documentation

## 2013-08-19 DIAGNOSIS — K439 Ventral hernia without obstruction or gangrene: Secondary | ICD-10-CM | POA: Insufficient documentation

## 2013-08-19 DIAGNOSIS — R1032 Left lower quadrant pain: Secondary | ICD-10-CM | POA: Insufficient documentation

## 2013-08-19 MED ORDER — SERTRALINE HCL 25 MG PO TABS: ORAL_TABLET | ORAL | Status: DC

## 2013-08-19 MED ORDER — NEBIVOLOL HCL 2.5 MG PO TABS
ORAL_TABLET | ORAL | Status: DC
Start: 2013-08-19 — End: 2014-04-03

## 2013-08-19 MED ORDER — HYDROCHLOROTHIAZIDE 25 MG PO TABS: ORAL_TABLET | ORAL | Status: DC

## 2013-08-19 NOTE — Assessment & Plan Note (Signed)
Blood pressure doing much better.  Same medication regimen.  Follow.  Check metabolic panel.  

## 2013-08-19 NOTE — Assessment & Plan Note (Signed)
Have discussed with her regarding the importance of BiPAP.  Follow.    

## 2013-08-19 NOTE — Assessment & Plan Note (Signed)
No reoccurring problems.  Continue daily aspirin.   

## 2013-08-19 NOTE — Assessment & Plan Note (Signed)
Low cholesterol diet and exercise.  Follow lipid panel.  Most recent check 08/15/13 - triglycerides 252 and LDL 141.  She declines rx medication.  Taking red yeast rice.  Wants to work on diet and exercise.

## 2013-08-19 NOTE — Assessment & Plan Note (Signed)
Check urine culture  

## 2013-08-19 NOTE — Assessment & Plan Note (Signed)
Persistent left lower quadrant pain.  Will check pelvic ultrasound.

## 2013-08-19 NOTE — Assessment & Plan Note (Signed)
Concern regarding hernia.  Will refer to Dr Tamala Julian for evaluation.

## 2013-08-22 ENCOUNTER — Encounter: Payer: Self-pay | Admitting: Emergency Medicine

## 2013-11-16 ENCOUNTER — Encounter: Payer: PRIVATE HEALTH INSURANCE | Admitting: Internal Medicine

## 2013-11-26 ENCOUNTER — Encounter: Payer: PRIVATE HEALTH INSURANCE | Admitting: Internal Medicine

## 2014-02-20 ENCOUNTER — Encounter: Payer: PRIVATE HEALTH INSURANCE | Admitting: Internal Medicine

## 2014-03-07 ENCOUNTER — Other Ambulatory Visit: Payer: Self-pay | Admitting: Internal Medicine

## 2014-03-07 NOTE — Telephone Encounter (Signed)
Appt 04/03/14

## 2014-03-19 ENCOUNTER — Ambulatory Visit: Payer: Self-pay | Admitting: Internal Medicine

## 2014-03-19 LAB — HM MAMMOGRAPHY: HM MAMMO: NEGATIVE

## 2014-03-20 ENCOUNTER — Encounter: Payer: Self-pay | Admitting: *Deleted

## 2014-04-03 ENCOUNTER — Ambulatory Visit (INDEPENDENT_AMBULATORY_CARE_PROVIDER_SITE_OTHER): Payer: PRIVATE HEALTH INSURANCE | Admitting: Internal Medicine

## 2014-04-03 ENCOUNTER — Encounter: Payer: Self-pay | Admitting: Internal Medicine

## 2014-04-03 VITALS — BP 100/70 | HR 69 | Temp 97.9°F | Ht 62.0 in | Wt 220.8 lb

## 2014-04-03 DIAGNOSIS — G459 Transient cerebral ischemic attack, unspecified: Secondary | ICD-10-CM

## 2014-04-03 DIAGNOSIS — E7800 Pure hypercholesterolemia, unspecified: Secondary | ICD-10-CM

## 2014-04-03 DIAGNOSIS — G4730 Sleep apnea, unspecified: Secondary | ICD-10-CM | POA: Diagnosis not present

## 2014-04-03 DIAGNOSIS — I10 Essential (primary) hypertension: Secondary | ICD-10-CM

## 2014-04-03 DIAGNOSIS — L989 Disorder of the skin and subcutaneous tissue, unspecified: Secondary | ICD-10-CM

## 2014-04-03 DIAGNOSIS — R739 Hyperglycemia, unspecified: Secondary | ICD-10-CM

## 2014-04-03 DIAGNOSIS — K439 Ventral hernia without obstruction or gangrene: Secondary | ICD-10-CM

## 2014-04-03 LAB — LIPID PANEL
Cholesterol: 250 mg/dL — ABNORMAL HIGH (ref 0–200)
HDL: 45 mg/dL (ref >39.00)
NONHDL: 205
Total CHOL/HDL Ratio: 6
Triglycerides: 257 mg/dL — ABNORMAL HIGH (ref 0.0–149.0)
VLDL: 51.4 mg/dL — ABNORMAL HIGH (ref 0.0–40.0)

## 2014-04-03 LAB — COMPREHENSIVE METABOLIC PANEL
ALK PHOS: 55 U/L (ref 39–117)
ALT: 25 U/L (ref 0–35)
AST: 23 U/L (ref 0–37)
Albumin: 3.7 g/dL (ref 3.5–5.2)
BUN: 13 mg/dL (ref 6–23)
CO2: 32 meq/L (ref 19–32)
Calcium: 9.9 mg/dL (ref 8.4–10.5)
Chloride: 100 mEq/L (ref 96–112)
Creatinine, Ser: 0.8 mg/dL (ref 0.4–1.2)
GFR: 82.15 mL/min (ref >60.00)
GLUCOSE: 110 mg/dL — AB (ref 70–99)
POTASSIUM: 4.3 meq/L (ref 3.5–5.1)
SODIUM: 138 meq/L (ref 135–145)
TOTAL PROTEIN: 7.3 g/dL (ref 6.0–8.3)
Total Bilirubin: 0.6 mg/dL (ref 0.2–1.2)

## 2014-04-03 LAB — HEMOGLOBIN A1C: Hgb A1c MFr Bld: 6 % (ref 4.6–6.5)

## 2014-04-03 MED ORDER — HYDROCHLOROTHIAZIDE 25 MG PO TABS: ORAL_TABLET | ORAL | Status: DC

## 2014-04-03 MED ORDER — NEBIVOLOL HCL 2.5 MG PO TABS: ORAL_TABLET | ORAL | Status: DC

## 2014-04-03 NOTE — Progress Notes (Signed)
Subjective:    Patient ID: Margaret Mercado, female    DOB: 1955-12-30, 58 y.o.   MRN: [id number]  HPI 58 year old female with past history of hypercholesterolemia, hypertension, sleep apnea requiring BiPAP and presumed TIA.  She comes in today to follow up on these issues as well as for a complete physical exam.   She states she has been doing well.  No chest pain or tightness.  Breathing stable.  No acid reflux.  Concern regarding  - hernia.  Bowels stable.  Saw Dr Tamala Julian previously.  Desires no further intervention.  Blood pressure has been doing well.  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  Plans to get more serious about her exercise.  She reports increased stress.  Feels she is handling things relatively well.  Taking zoloft.     Past Medical History  Diagnosis Date  . Hypertension     Edema with Norvasc. Unable to tolerate Lisinopril/ HCTZ.  Marland Kitchen Hyperlipidemia   . TIA (transient ischemic attack) 01/2009    CT and MRI of head showed no evidence for stroke. Carotid dopplers showed no significant plaque  . Abnormal echocardiogram 01/2009    Mild LVH, EF >55%, no regional wall motion abnormalities, normal RV, mild aortic insufficiency, no aortic stenosis.  . Hypokalemia     Manifested by lip and finger tingling  . Anxiety   . Palpitations     Event monitor 01/2009 with no significant arrhythmias    Current Outpatient Prescriptions on File Prior to Visit  Medication Sig Dispense Refill  . aspirin 81 MG EC tablet Take 81 mg by mouth daily.        . fish oil-omega-3 fatty acids 1000 MG capsule Take 0.75 capsules by mouth daily.        . hydrochlorothiazide (HYDRODIURIL) 25 MG tablet Take 1/2 tablet by mouth daily  30 tablet  3  . LORazepam (ATIVAN) 1 MG tablet Take 0.5 mg by mouth at bedtime as needed.       . nebivolol (BYSTOLIC) 2.5 MG tablet 1/2 tablet q day  30 tablet  3  . sertraline (ZOLOFT) 25 MG tablet TAKE 3 TABLETS BY MOUTH EVERY DAY  90 tablet  2   No current facility-administered  medications on file prior to visit.    Review of Systems Patient denies any headache, lightheadedness or dizziness.  No sinus or allergy symptoms.  No chest pain, tightness or palpitations.  No increased shortness of breath, cough or congestion.  No nausea or vomiting.  No acid reflux.  No bowel change, such as diarrhea, constipation, BRBPR or melana.  Concern over hernia as outlined.  Previously saw Dr Tamala Julian.  Discussed diet and exercise.       Objective:   Physical Exam  Filed Vitals:   04/03/14 0840  BP: 100/70  Pulse: 69  Temp: 97.9 F (36.6 C)   Blood pressure recheck:  118/68 (relatively equal - both arms)  58 year old female in no acute distress.   HEENT:  Nares- clear.  Oropharynx - without lesions. NECK:  Supple.  Nontender.  No audible bruit.  HEART:  Appears to be regular. LUNGS:  No crackles or wheezing audible.  Respirations even and unlabored.  RADIAL PULSE:  Equal bilaterally.    BREASTS:  No nipple discharge or nipple retraction present.  Could not appreciate any distinct nodules or axillary adenopathy.  ABDOMEN:  Soft, nontender.  Bowel sounds present and normal.  No audible abdominal bruit.   EXTREMITIES:  No increased  edema present.  DP pulses palpable and equal bilaterally. FEET:  No lesions.   SKIN:  Persistent breast lesion.            Assessment & Plan:  CARDIOVASCULAR.  Currently asymptomatic.    INCREASED PSYCHOSOCIAL STRESSORS.  On zoloft.   Follow.    HEALTH MAINTENANCE.  Physical today.   Mammogram 03/19/14 - [facility] I.  Have discussed the need for colon cancer screening.  Will notify me when agreeable for referral for colonoscopy.    Problem List Items Addressed This Visit   Essential hypertension - Primary     Blood pressure doing much better.  Same medication regimen.  Follow.  Check metabolic panel.     Relevant Medications      nebivolol (BYSTOLIC) tablet      hydrochlorothiazide tablet   Other Relevant Orders      Comprehensive metabolic  panel (Completed)   Hernia of abdominal wall     Saw Dr Tamala Julian previously.  Desires no further intervention at this time.  Follow.      Hypercholesterolemia     Low cholesterol diet and exercise.  Follow lipid panel.   Wants to work on diet and exercise.  Discussed diet and exercise today.       Relevant Medications      nebivolol (BYSTOLIC) tablet      hydrochlorothiazide tablet   Other Relevant Orders      Lipid panel (Completed)   Severe obesity (BMI >= 40)     Discussed diet, exercise and weight loss.       Sleep apnea     Have discussed with her regarding the importance of BiPAP.  Follow.       Transient cerebral ischemia     No reoccurring problems.  Continue daily aspirin.      Relevant Medications      nebivolol (BYSTOLIC) tablet      hydrochlorothiazide tablet    Other Visit Diagnoses   Hyperglycemia        Relevant Orders       Hemoglobin A1c (Completed)    Skin lesion        Relevant Orders       Ambulatory referral to Dermatology      I spent 25 minutes with the patient and more than 50% of the time was spent in consultation regarding the above.

## 2014-04-03 NOTE — Progress Notes (Signed)
Pre visit review using our clinic review tool, if applicable. No additional management support is needed unless otherwise documented below in the visit note. 

## 2014-04-04 ENCOUNTER — Encounter: Payer: Self-pay | Admitting: *Deleted

## 2014-04-04 LAB — LDL CHOLESTEROL, DIRECT: LDL DIRECT: 170 mg/dL

## 2014-04-07 NOTE — Assessment & Plan Note (Signed)
Low cholesterol diet and exercise.  Follow lipid panel.   Wants to work on diet and exercise.  Discussed diet and exercise today.

## 2014-04-07 NOTE — Assessment & Plan Note (Signed)
Discussed diet, exercise and weight loss

## 2014-04-07 NOTE — Assessment & Plan Note (Signed)
Blood pressure doing much better.  Same medication regimen.  Follow.  Check metabolic panel.

## 2014-04-07 NOTE — Assessment & Plan Note (Signed)
Saw Dr Tamala Julian previously.  Desires no further intervention at this time.  Follow.

## 2014-04-07 NOTE — Assessment & Plan Note (Signed)
Have discussed with her regarding the importance of BiPAP.  Follow.

## 2014-04-07 NOTE — Assessment & Plan Note (Signed)
No reoccurring problems.  Continue daily aspirin.

## 2014-08-07 ENCOUNTER — Other Ambulatory Visit: Payer: Self-pay | Admitting: Internal Medicine

## 2014-08-07 ENCOUNTER — Other Ambulatory Visit: Payer: Self-pay | Admitting: *Deleted

## 2014-09-04 ENCOUNTER — Telehealth: Payer: Self-pay | Admitting: *Deleted

## 2014-09-04 NOTE — Telephone Encounter (Signed)
Patient called to inform you that her mother Margaret Mercado passed away yesterday & she has been asked by her father to do her mothers hair. Patient has been on Lorazepam about a year ago & would like to know if you would be willing to give her a few to help her handle the next few days. We may reach her on her cell or home number.

## 2014-09-05 MED ORDER — LORAZEPAM 0.5 MG PO TABS: ORAL_TABLET | ORAL | Status: DC

## 2014-09-05 NOTE — Telephone Encounter (Signed)
rx ok'd for lorazepam #20 with no refills.  Let her know - let us know if she need anything.

## 2014-09-05 NOTE — Telephone Encounter (Signed)
Left voicemail notifying patient that Rx was faxed to pharmacy

## 2014-10-03 ENCOUNTER — Other Ambulatory Visit: Payer: Self-pay | Admitting: Internal Medicine

## 2014-10-03 ENCOUNTER — Ambulatory Visit: Payer: Self-pay | Admitting: Internal Medicine

## 2014-10-03 DIAGNOSIS — Z0289 Encounter for other administrative examinations: Secondary | ICD-10-CM

## 2014-10-11 ENCOUNTER — Other Ambulatory Visit: Payer: Self-pay | Admitting: *Deleted

## 2014-10-11 MED ORDER — SERTRALINE HCL 25 MG PO TABS: 75.0000 mg | ORAL_TABLET | Freq: Every day | ORAL | Status: DC

## 2014-10-11 MED ORDER — NEBIVOLOL HCL 2.5 MG PO TABS: ORAL_TABLET | ORAL | Status: DC

## 2014-12-05 ENCOUNTER — Other Ambulatory Visit: Payer: Self-pay | Admitting: Internal Medicine

## 2014-12-05 ENCOUNTER — Telehealth: Payer: Self-pay | Admitting: *Deleted

## 2014-12-05 MED ORDER — SERTRALINE HCL 25 MG PO TABS: 75.0000 mg | ORAL_TABLET | Freq: Every day | ORAL | Status: DC

## 2014-12-05 NOTE — Telephone Encounter (Signed)
Pt aware.

## 2014-12-05 NOTE — Telephone Encounter (Signed)
I sent in one refill to [facility].  She needs to keep her f/u appt.

## 2014-12-05 NOTE — Telephone Encounter (Signed)
Pt called requesting Zoloft refill.  Last OV 10.21.15, last refill 4.21.16, next OV 7.7.16.  Please advise refill

## 2014-12-05 NOTE — Telephone Encounter (Signed)
Last OV 10.21.15, next OV 7.7.16.  Please advise refill

## 2014-12-06 NOTE — Telephone Encounter (Signed)
Per record, this should have already been done (#90 on 12/05/14).

## 2014-12-09 ENCOUNTER — Other Ambulatory Visit: Payer: Self-pay | Admitting: *Deleted

## 2014-12-09 MED ORDER — SERTRALINE HCL 25 MG PO TABS: 75.0000 mg | ORAL_TABLET | Freq: Every day | ORAL | Status: DC

## 2014-12-09 MED ORDER — NEBIVOLOL HCL 2.5 MG PO TABS: ORAL_TABLET | ORAL | Status: DC

## 2014-12-19 ENCOUNTER — Ambulatory Visit: Payer: PRIVATE HEALTH INSURANCE | Admitting: Internal Medicine

## 2015-02-27 ENCOUNTER — Ambulatory Visit: Payer: PRIVATE HEALTH INSURANCE | Admitting: Internal Medicine

## 2015-02-28 ENCOUNTER — Ambulatory Visit: Payer: PRIVATE HEALTH INSURANCE | Admitting: Internal Medicine

## 2015-04-07 ENCOUNTER — Ambulatory Visit: Payer: PRIVATE HEALTH INSURANCE | Admitting: Internal Medicine

## 2015-04-09 ENCOUNTER — Telehealth: Payer: Self-pay | Admitting: Internal Medicine

## 2015-04-09 ENCOUNTER — Other Ambulatory Visit: Payer: Self-pay | Admitting: *Deleted

## 2015-04-09 NOTE — Telephone Encounter (Signed)
Pt states that she doesn't see shy its a problem to get a 90 day because she had to change insurance company & they only allow 90 day supplies. Pt states if this is a problem that she needs to find a new doctor. And the reason she hasnt been keeping her appt is b/c her mother passed in March & was sent from one doc to the next & nobody found the problem & that why she hasnt wanted to come see Dr. Nicki Reaper. Pt states that if we want give her her medicine then she will just die of a heart attack. Pt wasn't very nice at all. I reminded pt that she has had several changes to be seen before todays event & her medicine bottles even mentioned that she needed an appt for further refills. Pt states if we cant give her a 90 day supply then she needs to find a new doctor.

## 2015-04-09 NOTE — Telephone Encounter (Signed)
Duplicate request-see refill request message

## 2015-04-09 NOTE — Telephone Encounter (Signed)
Please advise 

## 2015-04-09 NOTE — Telephone Encounter (Signed)
Please advise on refills. Pt hasn't been seen since 04/03/2014. She no showed on 10/03/14  Cancelled on 7/7,9/15,9/16 & 10/24. Rescheduled again to: 04/23/15.

## 2015-04-09 NOTE — Telephone Encounter (Signed)
I will only refill #30 days for each rx (zoloft and bystolic).  She needs to keep the 04/21/15 appt for more refills.

## 2015-04-09 NOTE — Telephone Encounter (Signed)
Margaret Mercado called from [facility] regarding if pt medications sertraline (ZOLOFT) 25 MG tablet and nebivolol (BYSTOLIC) 2.5 MG tablet was received? It was faxed on 04/03/2015. Fax number is [phone] and [place] number is [phone]. Thank You!

## 2015-04-10 ENCOUNTER — Other Ambulatory Visit: Payer: Self-pay | Admitting: Internal Medicine

## 2015-04-10 MED ORDER — SERTRALINE HCL 25 MG PO TABS: 75.0000 mg | ORAL_TABLET | Freq: Every day | ORAL | Status: DC

## 2015-04-10 NOTE — Telephone Encounter (Signed)
Last OV was 04/03/14? Please advise.

## 2015-04-10 NOTE — Telephone Encounter (Signed)
Refilled bystolic #92 with no refills.  Pt needs to keep next appt for more refills.

## 2015-04-10 NOTE — Telephone Encounter (Signed)
Sent in 30 day supply.  Pt left message today stating she could receive a 30 days supply and will keep appt.

## 2015-04-23 ENCOUNTER — Ambulatory Visit (INDEPENDENT_AMBULATORY_CARE_PROVIDER_SITE_OTHER): Payer: PRIVATE HEALTH INSURANCE | Admitting: Internal Medicine

## 2015-04-23 ENCOUNTER — Encounter: Payer: Self-pay | Admitting: Internal Medicine

## 2015-04-23 VITALS — BP 144/73 | HR 59 | Temp 98.0°F | Resp 18 | Ht 62.0 in | Wt 223.4 lb

## 2015-04-23 DIAGNOSIS — I10 Essential (primary) hypertension: Secondary | ICD-10-CM

## 2015-04-23 DIAGNOSIS — G459 Transient cerebral ischemic attack, unspecified: Secondary | ICD-10-CM

## 2015-04-23 DIAGNOSIS — E7800 Pure hypercholesterolemia, unspecified: Secondary | ICD-10-CM

## 2015-04-23 DIAGNOSIS — R739 Hyperglycemia, unspecified: Secondary | ICD-10-CM

## 2015-04-23 MED ORDER — NEBIVOLOL HCL 2.5 MG PO TABS: 1.2500 mg | ORAL_TABLET | Freq: Every day | ORAL | Status: DC

## 2015-04-23 MED ORDER — SERTRALINE HCL 25 MG PO TABS: 75.0000 mg | ORAL_TABLET | Freq: Every day | ORAL | Status: DC

## 2015-04-23 NOTE — Progress Notes (Signed)
Pre-visit discussion using our clinic review tool. No additional management support is needed unless otherwise documented below in the visit note.  

## 2015-04-23 NOTE — Progress Notes (Signed)
Patient ID: Margaret Mercado, female   DOB: 1955-08-30, 59 y.o.   MRN: [id number]   Subjective:    Patient ID: Margaret Mercado, female    DOB: 12-May-1956, 59 y.o.   MRN: [id number]  HPI  Patient with past history of hypertension, anxiety and hypercholesterolemia.  She comes in today to follow up on these issues and also for a medication refill.  I have not seen her in over one year.  Discussed the need for regular follow ups.  She reports increased stress coping with her mother's death.  We discussed this at length.  She does not feel she needs anything more at this point.  Discussed the need for diet and exercise.  No cardiac symptoms with increased activity or exertion.  No sob.  No abdominal pain or cramping.  Bowels stable.     Past Medical History  Diagnosis Date  . Hypertension     Edema with Norvasc. Unable to tolerate Lisinopril/ HCTZ.  Marland Kitchen Hyperlipidemia   . TIA (transient ischemic attack) 01/2009    CT and MRI of head showed no evidence for stroke. Carotid dopplers showed no significant plaque  . Abnormal echocardiogram 01/2009    Mild LVH, EF >55%, no regional wall motion abnormalities, normal RV, mild aortic insufficiency, no aortic stenosis.  . Hypokalemia     Manifested by lip and finger tingling  . Anxiety   . Palpitations     Event monitor 01/2009 with no significant arrhythmias   Past Surgical History  Procedure Laterality Date  . Umbilical hernia repair    . Cryoablation      Of uterus  . Urethral dilation      removal of bladder polyps   Family History  Problem Relation Age of Onset  . Hypertension Mother   . Arrhythmia Mother     Atrial fibrillation  . Hypertension Father   . Hypertension Brother   . Hypertension Paternal Grandmother   . Coronary artery disease Neg Hx     Premature  . Breast cancer Neg Hx   . Colon cancer Neg Hx    Social History   Social History  . Marital Status: Married    Spouse Name: N/A  . Number of Children: 2  . Years of  Education: N/A   Occupational History  . [employer] cabin crew     full time   Social History Main Topics  . Smoking status: Never Smoker   . Smokeless tobacco: Never Used  . Alcohol Use: No  . Drug Use: No  . Sexual Activity: Not Asked   Other Topics Concern  . None   Social History Narrative   Married   Gets regular exercise    Outpatient Encounter Prescriptions as of 04/23/2015  Medication Sig  . aspirin 81 MG EC tablet Take 81 mg by mouth daily.    . fish oil-omega-3 fatty acids 1000 MG capsule Take 0.75 capsules by mouth daily.    . hydrochlorothiazide (HYDRODIURIL) 25 MG tablet Take 1/2 tablet by mouth daily  . LORazepam (ATIVAN) 0.5 MG tablet Take 1/2 table bid prn.  . nebivolol (BYSTOLIC) 2.5 MG tablet 1/2 tablet q day  . nebivolol (BYSTOLIC) 2.5 MG tablet Take 0.5 tablets (1.25 mg total) by mouth daily.  . sertraline (ZOLOFT) 25 MG tablet Take 3 tablets (75 mg total) by mouth daily.  . [DISCONTINUED] BYSTOLIC 2.5 MG tablet TAKE 1/2 TABLET BY MOUTH EVERY DAY  . [DISCONTINUED] sertraline (ZOLOFT) 25 MG tablet Take 3 tablets (75 mg  total) by mouth daily.   No facility-administered encounter medications on file as of 04/23/2015.    Review of Systems  Constitutional: Negative for appetite change and unexpected weight change.  HENT: Negative for congestion and sinus pressure.   Eyes: Negative for pain and discharge.  Respiratory: Negative for cough, chest tightness and shortness of breath.   Cardiovascular: Negative for chest pain, palpitations and leg swelling.  Gastrointestinal: Negative for nausea, vomiting, abdominal pain and diarrhea.  Genitourinary: Negative for dysuria and difficulty urinating.  Musculoskeletal: Negative for back pain and joint swelling.  Skin: Negative for color change and rash.  Neurological: Negative for dizziness, light-headedness and headaches.  Psychiatric/Behavioral: Negative for dysphoric mood and agitation.       Objective:      Physical Exam  Constitutional: She appears well-developed and well-nourished. No distress.  HENT:  Nose: Nose normal.  Mouth/Throat: Oropharynx is clear and moist.  Eyes: Conjunctivae are normal. Right eye exhibits no discharge. Left eye exhibits no discharge.  Neck: Neck supple. No thyromegaly present.  Cardiovascular: Normal rate and regular rhythm.   Pulmonary/Chest: Breath sounds normal. No respiratory distress. She has no wheezes.  Abdominal: Soft. Bowel sounds are normal. There is no tenderness.  Musculoskeletal: She exhibits no edema or tenderness.  Lymphadenopathy:    She has no cervical adenopathy.  Skin: No rash noted. No erythema.  Psychiatric: She has a normal mood and affect. Her behavior is normal.    BP 144/73 mmHg  Pulse 59  Temp(Src) 98 F (36.7 C) (Oral)  Resp 18  Ht 5\' 2"  (1.575 m)  Wt 223 lb 6 oz (101.322 kg)  BMI 40.85 kg/m2  SpO2 98% Wt Readings from Last 3 Encounters:  04/23/15 223 lb 6 oz (101.322 kg)  04/03/14 220 lb 12 oz (100.132 kg)  08/16/13 222 lb 4 oz (100.812 kg)     Lab Results  Component Value Date   WBC 6.2 08/15/2013   HGB 13.0 08/15/2013   HCT 39.0 08/15/2013   PLT 253.0 08/15/2013   GLUCOSE 110* 04/03/2014   CHOL 250* 04/03/2014   TRIG 257.0* 04/03/2014   HDL 45.00 04/03/2014   LDLDIRECT 170.0 04/03/2014   LDLCALC 141* 08/15/2013   ALT 25 04/03/2014   AST 23 04/03/2014   NA 138 04/03/2014   K 4.3 04/03/2014   CL 100 04/03/2014   CREATININE 0.8 04/03/2014   BUN 13 04/03/2014   CO2 32 04/03/2014   TSH 3.86 08/15/2013   HGBA1C 6.0 04/03/2014   MICROALBUR 1.3 08/15/2013       Assessment & Plan:   Problem List Items Addressed This Visit    Essential hypertension - Primary    Blood pressure has been under good control.  Recheck improved. Continue same medication regimen.  Follow pressures.  Follow metabolic panel.        Relevant Medications   nebivolol (BYSTOLIC) 2.5 MG tablet   Other Relevant Orders   CBC with  Differential/Platelet   Basic metabolic panel   Hypercholesterolemia    Low cholesterol diet and exercise.  Follow lipid panel.  Has desired no prescription medication.        Relevant Medications   nebivolol (BYSTOLIC) 2.5 MG tablet   Other Relevant Orders   Lipid panel   Hepatic function panel   Severe obesity (BMI >= 40) (HCC)    Diet and exercise.  Follow.        Transient cerebral ischemia    No reoccurring problems.  Continue aspirin.  Relevant Medications   nebivolol (BYSTOLIC) 2.5 MG tablet    Other Visit Diagnoses    Hyperglycemia        Relevant Orders    TSH    Hemoglobin A1c    Microalbumin / creatinine urine ratio        Einar Pheasant, MD

## 2015-04-27 ENCOUNTER — Encounter: Payer: Self-pay | Admitting: Internal Medicine

## 2015-04-27 NOTE — Assessment & Plan Note (Signed)
Blood pressure has been under good control.  Recheck improved.  Continue same medication regimen.  Follow pressures.  Follow metabolic panel.   

## 2015-04-27 NOTE — Assessment & Plan Note (Signed)
Low cholesterol diet and exercise.  Follow lipid panel.  Has desired no prescription medication.

## 2015-04-27 NOTE — Assessment & Plan Note (Signed)
Diet and exercise.  Follow.  

## 2015-04-27 NOTE — Assessment & Plan Note (Signed)
No reoccurring problems.  Continue aspirin.

## 2015-09-08 ENCOUNTER — Other Ambulatory Visit: Payer: Self-pay | Admitting: Internal Medicine

## 2015-09-08 ENCOUNTER — Telehealth: Payer: Self-pay

## 2015-09-08 MED ORDER — NEBIVOLOL HCL 2.5 MG PO TABS: ORAL_TABLET | ORAL | Status: DC

## 2015-09-08 NOTE — Telephone Encounter (Signed)
Pt's mail order pharmacy called in for a short term rx can be sent in the mail.

## 2016-01-08 ENCOUNTER — Other Ambulatory Visit: Payer: Self-pay | Admitting: Internal Medicine

## 2016-01-09 NOTE — Telephone Encounter (Signed)
Last seen in Novemeber 2016, no future appt scheduled. Received refill request for  Zoloft.

## 2016-01-10 NOTE — Telephone Encounter (Signed)
Call and notify pt that she needs appt scheduled.  Only refill until appt.  If does not keep appt, cannot continue to refill.

## 2016-01-12 NOTE — Telephone Encounter (Signed)
Pt can not be reached by phone. Voicemail full on mobile & when calling house phone, I was informed that she was on vacation this week. Medication will be declined with a note that patients needs an appt.

## 2016-01-13 NOTE — Telephone Encounter (Signed)
Home number & work number both go to [employer]" & pt voicemail is full on mobile. Not sure how else to get in touch with this patient.

## 2016-01-13 NOTE — Telephone Encounter (Signed)
I don't want her to stop this abruptly if she has been taking regularly. Needs an appt scheduled and then refill until appt.  Thanks

## 2016-01-14 NOTE — Telephone Encounter (Signed)
If have tried all of these, then just send her a letter and let her know needs f/u.  I am willing to refill medication until f/u, but unable to continue to refill if not seen.  Thanks.

## 2016-01-19 ENCOUNTER — Other Ambulatory Visit: Payer: Self-pay | Admitting: *Deleted

## 2016-01-19 ENCOUNTER — Encounter: Payer: Self-pay | Admitting: *Deleted

## 2016-01-19 MED ORDER — SERTRALINE HCL 25 MG PO TABS: 75.0000 mg | ORAL_TABLET | Freq: Every day | ORAL | 0 refills | Status: DC

## 2016-01-19 NOTE — Telephone Encounter (Signed)
Mailed pt a letter after trying to reach her again. Mailbox is full & unable to leave a message. Sent in a 30 day refill.

## 2016-03-09 ENCOUNTER — Other Ambulatory Visit: Payer: Self-pay

## 2016-03-09 ENCOUNTER — Encounter: Payer: 59 | Admitting: Internal Medicine

## 2016-03-09 NOTE — Telephone Encounter (Signed)
patient made appmt for Apr 14, 2016

## 2016-03-10 MED ORDER — SERTRALINE HCL 25 MG PO TABS: 75.0000 mg | ORAL_TABLET | Freq: Every day | ORAL | 0 refills | Status: DC

## 2016-03-10 MED ORDER — NEBIVOLOL HCL 2.5 MG PO TABS: ORAL_TABLET | ORAL | 0 refills | Status: DC

## 2016-04-14 ENCOUNTER — Encounter: Payer: 59 | Admitting: Internal Medicine

## 2016-04-29 ENCOUNTER — Encounter: Payer: 59 | Admitting: Internal Medicine

## 2016-05-10 ENCOUNTER — Telehealth: Payer: Self-pay | Admitting: Internal Medicine

## 2016-05-10 MED ORDER — NEBIVOLOL HCL 2.5 MG PO TABS: ORAL_TABLET | ORAL | 0 refills | Status: DC

## 2016-05-10 NOTE — Telephone Encounter (Signed)
Pt called requesting a refill on nebivolol (BYSTOLIC) 2.5 MG tablet. Pt has appt on 12/4. Thank you!  Pharmacy - [facility] [place], [place] - [address]  Call pt @ [phone]

## 2016-05-10 NOTE — Telephone Encounter (Signed)
Needs to keep this appmt, sent 15 tabs

## 2016-05-11 ENCOUNTER — Other Ambulatory Visit: Payer: Self-pay | Admitting: Internal Medicine

## 2016-05-17 ENCOUNTER — Other Ambulatory Visit (HOSPITAL_COMMUNITY)
Admission: RE | Admit: 2016-05-17 | Discharge: 2016-05-17 | Disposition: A | Discharge status: Home / Self Care | Payer: 59 | Source: Ambulatory Visit | Attending: Internal Medicine | Admitting: Internal Medicine

## 2016-05-17 ENCOUNTER — Ambulatory Visit (INDEPENDENT_AMBULATORY_CARE_PROVIDER_SITE_OTHER): Payer: 59 | Admitting: Internal Medicine

## 2016-05-17 ENCOUNTER — Encounter: Payer: Self-pay | Admitting: Internal Medicine

## 2016-05-17 VITALS — BP 120/80 | HR 82 | Temp 97.8°F | Ht 61.0 in | Wt 219.6 lb

## 2016-05-17 DIAGNOSIS — Z0000 Encounter for general adult medical examination without abnormal findings: Secondary | ICD-10-CM

## 2016-05-17 DIAGNOSIS — Z1151 Encounter for screening for human papillomavirus (HPV): Secondary | ICD-10-CM | POA: Insufficient documentation

## 2016-05-17 DIAGNOSIS — E7800 Pure hypercholesterolemia, unspecified: Secondary | ICD-10-CM

## 2016-05-17 DIAGNOSIS — Z1239 Encounter for other screening for malignant neoplasm of breast: Secondary | ICD-10-CM

## 2016-05-17 DIAGNOSIS — Z01419 Encounter for gynecological examination (general) (routine) without abnormal findings: Secondary | ICD-10-CM | POA: Insufficient documentation

## 2016-05-17 DIAGNOSIS — K439 Ventral hernia without obstruction or gangrene: Secondary | ICD-10-CM

## 2016-05-17 DIAGNOSIS — Z124 Encounter for screening for malignant neoplasm of cervix: Secondary | ICD-10-CM

## 2016-05-17 DIAGNOSIS — I10 Essential (primary) hypertension: Secondary | ICD-10-CM

## 2016-05-17 DIAGNOSIS — R739 Hyperglycemia, unspecified: Secondary | ICD-10-CM

## 2016-05-17 MED ORDER — SERTRALINE HCL 25 MG PO TABS: 75.0000 mg | ORAL_TABLET | Freq: Every day | ORAL | 0 refills | Status: DC

## 2016-05-17 MED ORDER — NEBIVOLOL HCL 2.5 MG PO TABS: 1.2500 mg | ORAL_TABLET | Freq: Every day | ORAL | 0 refills | Status: DC

## 2016-05-17 NOTE — Patient Instructions (Signed)
cologuard (colon testing)

## 2016-05-17 NOTE — Progress Notes (Signed)
Pre visit review using our clinic review tool, if applicable. No additional management support is needed unless otherwise documented below in the visit note. 

## 2016-05-17 NOTE — Progress Notes (Signed)
Patient ID: Margaret Mercado, female   DOB: 08-Dec-1955, 60 y.o.   MRN: [id number]   Subjective:    Patient ID: Margaret Mercado, female    DOB: [birth year], 60 y.o.   MRN: [id number]  HPI  Patient here for a physical exam.  She reports she has had a stressful time recently.  Her father is moving in with her.  She tries to stay active.  Not doing a lot of formal exercise.  No chest pain.  No sob.  No acid reflux.  She does report a pulling sensation in her abdomen.  Feels over hernia.    Bowels stable.  No urinary change.    Past Medical History:  Diagnosis Date  . Abnormal echocardiogram 01/2009   Mild LVH, EF >55%, no regional wall motion abnormalities, normal RV, mild aortic insufficiency, no aortic stenosis.  . Anxiety   . Hyperlipidemia   . Hypertension    Edema with Norvasc. Unable to tolerate Lisinopril/ HCTZ.  Marland Kitchen Hypokalemia    Manifested by lip and finger tingling  . Palpitations    Event monitor 01/2009 with no significant arrhythmias  . TIA (transient ischemic attack) 01/2009   CT and MRI of head showed no evidence for stroke. Carotid dopplers showed no significant plaque   Past Surgical History:  Procedure Laterality Date  . CRYOABLATION     Of uterus  . UMBILICAL HERNIA REPAIR    . URETHRAL DILATION     removal of bladder polyps   Family History  Problem Relation Age of Onset  . Hypertension Mother   . Arrhythmia Mother     Atrial fibrillation  . Hypertension Father   . Hypertension Brother   . Hypertension Paternal Grandmother   . Coronary artery disease Neg Hx     Premature  . Breast cancer Neg Hx   . Colon cancer Neg Hx    Social History   Social History  . Marital status: Married    Spouse name: N/A  . Number of children: 2  . Years of education: N/A   Occupational History  . [employer] cabin crew     full time   Social History Main Topics  . Smoking status: Never Smoker  . Smokeless tobacco: Never Used  . Alcohol use No  . Drug use: No  .  Sexual activity: Not Asked   Other Topics Concern  . None   Social History Narrative   Married   Gets regular exercise    Outpatient Encounter Prescriptions as of 05/17/2016  Medication Sig  . aspirin 81 MG EC tablet Take 81 mg by mouth daily.    . fish oil-omega-3 fatty acids 1000 MG capsule Take 0.75 capsules by mouth daily.    . hydrochlorothiazide (HYDRODIURIL) 25 MG tablet Take 1/2 tablet by mouth daily  . LORazepam (ATIVAN) 0.5 MG tablet Take 1/2 table bid prn.  . nebivolol (BYSTOLIC) 2.5 MG tablet TAKE ONE-HALF TABLET BY MOUTH DAILY--(DAILY DOSE IS 1.25MG )  . nebivolol (BYSTOLIC) 2.5 MG tablet Take 0.5 tablets (1.25 mg total) by mouth daily.  . sertraline (ZOLOFT) 25 MG tablet Take 3 tablets (75 mg total) by mouth daily.  . [DISCONTINUED] BYSTOLIC 2.5 MG tablet TAKE 1/2 TABLET BY MOUTH EVERY DAY  . [DISCONTINUED] sertraline (ZOLOFT) 25 MG tablet Take 3 tablets (75 mg total) by mouth daily.   No facility-administered encounter medications on file as of 05/17/2016.     Review of Systems  Constitutional: Negative for appetite change and unexpected weight change.  HENT: Negative for congestion and sinus pressure.   Eyes: Negative for pain and visual disturbance.  Respiratory: Negative for cough, chest tightness and shortness of breath.   Cardiovascular: Negative for chest pain, palpitations and leg swelling.  Gastrointestinal: Negative for abdominal pain, diarrhea, nausea and vomiting.  Genitourinary: Negative for difficulty urinating and dysuria.  Musculoskeletal: Negative for back pain and joint swelling.  Skin: Negative for color change and rash.  Neurological: Negative for dizziness, light-headedness and headaches.  Hematological: Negative for adenopathy. Does not bruise/bleed easily.  Psychiatric/Behavioral: Negative for agitation and dysphoric mood.       Objective:    Physical Exam  Constitutional: She is oriented to person, place, and time. She appears  well-developed and well-nourished. No distress.  HENT:  Nose: Nose normal.  Mouth/Throat: Oropharynx is clear and moist.  Eyes: Right eye exhibits no discharge. Left eye exhibits no discharge. No scleral icterus.  Neck: Neck supple. No thyromegaly present.  Cardiovascular: Normal rate and regular rhythm.   Pulmonary/Chest: Breath sounds normal. No accessory muscle usage. No tachypnea. No respiratory distress. She has no decreased breath sounds. She has no wheezes. She has no rhonchi. Right breast exhibits no inverted nipple, no mass, no nipple discharge and no tenderness (no axillary adenopathy). Left breast exhibits no inverted nipple, no mass, no nipple discharge and no tenderness (no axilarry adenopathy).  Abdominal: Soft. Bowel sounds are normal. There is no tenderness.  Genitourinary:  Genitourinary Comments: Normal external genitalia.  Vaginal vault without lesions.  Cervix identified.  Pap smear performed.  Could not appreciate any adnexal masses or tenderness.    Musculoskeletal: She exhibits no edema or tenderness.  Lymphadenopathy:    She has no cervical adenopathy.  Neurological: She is alert and oriented to person, place, and time.  Skin: Skin is warm. No rash noted. No erythema.  Psychiatric: She has a normal mood and affect. Her behavior is normal.    BP 120/80   Pulse 82   Temp 97.8 F (36.6 C) (Oral)   Ht 5\' 1"  (1.549 m)   Wt 219 lb 9.6 oz (99.6 kg)   SpO2 96%   BMI 41.49 kg/m  Wt Readings from Last 3 Encounters:  05/17/16 219 lb 9.6 oz (99.6 kg)  04/23/15 223 lb 6 oz (101.3 kg)  04/03/14 220 lb 12 oz (100.1 kg)     Lab Results  Component Value Date   WBC 6.2 08/15/2013   HGB 13.0 08/15/2013   HCT 39.0 08/15/2013   PLT 253.0 08/15/2013   GLUCOSE 110 (H) 04/03/2014   CHOL 250 (H) 04/03/2014   TRIG 257.0 (H) 04/03/2014   HDL 45.00 04/03/2014   LDLDIRECT 170.0 04/03/2014   LDLCALC 141 (H) 08/15/2013   ALT 25 04/03/2014   AST 23 04/03/2014   NA 138  04/03/2014   K 4.3 04/03/2014   CL 100 04/03/2014   CREATININE 0.8 04/03/2014   BUN 13 04/03/2014   CO2 32 04/03/2014   TSH 3.86 08/15/2013   HGBA1C 6.0 04/03/2014   MICROALBUR 1.3 08/15/2013        Assessment & Plan:   Problem List Items Addressed This Visit    Essential hypertension    Blood pressure under good control.  Continue same medication regimen.  Follow pressures.  Follow metabolic panel.        Relevant Medications   nebivolol (BYSTOLIC) 2.5 MG tablet   Other Relevant Orders   CBC with Differential/Platelet   TSH   Basic metabolic panel  Healthcare maintenance    Physical today 05/17/16.  Scheduled for mammogram.  PAP 05/17/16.   Needs colonoscopy.  Discussed with her.        Hernia of abdominal wall    Saw Dr Tamala Julian previously.  Feels a pulling sensation.  Discussed further evaluation.  Will let me know when desires further intervention.        Hypercholesterolemia    Low cholesterol diet and exercise.  Follow lipid panel.        Relevant Medications   nebivolol (BYSTOLIC) 2.5 MG tablet   Other Relevant Orders   Hepatic function panel   Lipid panel   Severe obesity (BMI >= 40) (HCC)    Diet and exercise.         Other Visit Diagnoses    Routine general medical examination at a health care facility    -  Primary   Encounter for breast cancer screening other than mammogram       Relevant Orders   MM Digital Screening   Routine cervical smear       Relevant Orders   Cytology - PAP (Completed)   Hyperglycemia       Relevant Orders   Hemoglobin A1c   Microalbumin / creatinine urine ratio       Einar Pheasant, MD

## 2016-05-17 NOTE — Assessment & Plan Note (Addendum)
Physical today 05/17/16.  Scheduled for mammogram.  PAP 05/17/16.   Needs colonoscopy.  Discussed with her.

## 2016-05-19 LAB — CYTOLOGY - PAP
DIAGNOSIS: NEGATIVE
HPV: NOT DETECTED

## 2016-05-23 ENCOUNTER — Encounter: Payer: Self-pay | Admitting: Internal Medicine

## 2016-05-23 NOTE — Assessment & Plan Note (Signed)
Blood pressure under good control.  Continue same medication regimen.  Follow pressures.  Follow metabolic panel.   

## 2016-05-23 NOTE — Assessment & Plan Note (Signed)
Saw Dr Tamala Julian previously.  Feels a pulling sensation.  Discussed further evaluation.  Will let me know when desires further intervention.

## 2016-05-23 NOTE — Assessment & Plan Note (Signed)
Diet and exercise.   

## 2016-05-23 NOTE — Assessment & Plan Note (Signed)
Low cholesterol diet and exercise.  Follow lipid panel.   

## 2016-05-28 ENCOUNTER — Other Ambulatory Visit: Payer: 59

## 2016-06-09 ENCOUNTER — Other Ambulatory Visit: Payer: 59

## 2016-06-24 ENCOUNTER — Other Ambulatory Visit: Payer: Self-pay | Admitting: Internal Medicine

## 2016-06-24 ENCOUNTER — Other Ambulatory Visit: Payer: 59

## 2016-06-24 ENCOUNTER — Ambulatory Visit
Admission: RE | Admit: 2016-06-24 | Discharge: 2016-06-24 | Disposition: A | Discharge status: Home / Self Care | Payer: 59 | Source: Ambulatory Visit | Attending: Internal Medicine | Admitting: Internal Medicine

## 2016-06-24 DIAGNOSIS — Z1231 Encounter for screening mammogram for malignant neoplasm of breast: Secondary | ICD-10-CM | POA: Diagnosis present

## 2016-06-24 DIAGNOSIS — Z1239 Encounter for other screening for malignant neoplasm of breast: Secondary | ICD-10-CM

## 2016-07-08 ENCOUNTER — Other Ambulatory Visit (INDEPENDENT_AMBULATORY_CARE_PROVIDER_SITE_OTHER): Payer: 59

## 2016-07-08 DIAGNOSIS — R739 Hyperglycemia, unspecified: Secondary | ICD-10-CM | POA: Diagnosis not present

## 2016-07-08 DIAGNOSIS — I10 Essential (primary) hypertension: Secondary | ICD-10-CM | POA: Diagnosis not present

## 2016-07-08 DIAGNOSIS — E7800 Pure hypercholesterolemia, unspecified: Secondary | ICD-10-CM | POA: Diagnosis not present

## 2016-07-08 LAB — HEPATIC FUNCTION PANEL
ALBUMIN: 4.1 g/dL (ref 3.5–5.2)
ALT: 19 U/L (ref 0–35)
AST: 14 U/L (ref 0–37)
Alkaline Phosphatase: 50 U/L (ref 39–117)
Bilirubin, Direct: 0.1 mg/dL (ref 0.0–0.3)
Total Bilirubin: 0.4 mg/dL (ref 0.2–1.2)
Total Protein: 6.6 g/dL (ref 6.0–8.3)

## 2016-07-08 LAB — CBC WITH DIFFERENTIAL/PLATELET
BASOS PCT: 0.5 % (ref 0.0–3.0)
Basophils Absolute: 0 10*3/uL (ref 0.0–0.1)
EOS PCT: 2.3 % (ref 0.0–5.0)
Eosinophils Absolute: 0.2 10*3/uL (ref 0.0–0.7)
HCT: 37.7 % (ref 36.0–46.0)
Hemoglobin: 12.9 g/dL (ref 12.0–15.0)
LYMPHS ABS: 1.9 10*3/uL (ref 0.7–4.0)
Lymphocytes Relative: 26.6 % (ref 12.0–46.0)
MCHC: 34.2 g/dL (ref 30.0–36.0)
MCV: 93.4 fl (ref 78.0–100.0)
MONOS PCT: 6.3 % (ref 3.0–12.0)
Monocytes Absolute: 0.5 10*3/uL (ref 0.1–1.0)
NEUTROS PCT: 64.3 % (ref 43.0–77.0)
Neutro Abs: 4.7 10*3/uL (ref 1.4–7.7)
Platelets: 245 10*3/uL (ref 150.0–400.0)
RBC: 4.03 Mil/uL (ref 3.87–5.11)
RDW: 13.8 % (ref 11.5–15.5)
WBC: 7.3 10*3/uL (ref 4.0–10.5)

## 2016-07-08 LAB — LDL CHOLESTEROL, DIRECT: Direct LDL: 155 mg/dL

## 2016-07-08 LAB — BASIC METABOLIC PANEL
BUN: 13 mg/dL (ref 6–23)
CHLORIDE: 105 meq/L (ref 96–112)
CO2: 28 meq/L (ref 19–32)
Calcium: 9.3 mg/dL (ref 8.4–10.5)
Creatinine, Ser: 0.7 mg/dL (ref 0.40–1.20)
GFR: 90.97 mL/min (ref >60.00)
Glucose, Bld: 137 mg/dL — ABNORMAL HIGH (ref 70–99)
Potassium: 3.9 mEq/L (ref 3.5–5.1)
SODIUM: 141 meq/L (ref 135–145)

## 2016-07-08 LAB — LIPID PANEL
CHOLESTEROL: 241 mg/dL — AB (ref 0–200)
HDL: 42.4 mg/dL (ref >39.00)
NonHDL: 198.11
Total CHOL/HDL Ratio: 6
Triglycerides: 239 mg/dL — ABNORMAL HIGH (ref 0.0–149.0)
VLDL: 47.8 mg/dL — ABNORMAL HIGH (ref 0.0–40.0)

## 2016-07-08 LAB — HEMOGLOBIN A1C: HEMOGLOBIN A1C: 6.3 % (ref 4.6–6.5)

## 2016-07-08 LAB — TSH: TSH: 3.43 u[IU]/mL (ref 0.35–4.50)

## 2016-07-08 NOTE — Addendum Note (Signed)
Addended by: Arby Barrette on: 07/08/2016 08:44 AM   Modules accepted: Orders

## 2016-07-12 ENCOUNTER — Other Ambulatory Visit (INDEPENDENT_AMBULATORY_CARE_PROVIDER_SITE_OTHER): Payer: 59

## 2016-07-12 DIAGNOSIS — I10 Essential (primary) hypertension: Secondary | ICD-10-CM

## 2016-07-12 LAB — MICROALBUMIN / CREATININE URINE RATIO
Creatinine,U: 74.1 mg/dL
Microalb Creat Ratio: 0.9 mg/g (ref 0.0–30.0)
Microalb, Ur: <0.7 mg/dL (ref 0.0–1.9)

## 2016-07-14 ENCOUNTER — Telehealth: Payer: Self-pay | Admitting: Internal Medicine

## 2016-07-14 NOTE — Telephone Encounter (Signed)
Pt called returning your call. Please advise, thank you!  Call pt @ [phone]

## 2016-07-23 ENCOUNTER — Telehealth: Payer: Self-pay | Admitting: Radiology

## 2016-07-23 NOTE — Telephone Encounter (Signed)
Pt called needing results for labs, states she called on the 31st.

## 2016-07-23 NOTE — Telephone Encounter (Signed)
Lm to call office

## 2016-07-27 NOTE — Telephone Encounter (Signed)
Lm to call office

## 2016-07-29 NOTE — Telephone Encounter (Signed)
See results message 

## 2016-09-02 ENCOUNTER — Other Ambulatory Visit: Payer: Self-pay | Admitting: Internal Medicine

## 2016-10-05 ENCOUNTER — Other Ambulatory Visit: Payer: Self-pay | Admitting: Internal Medicine

## 2016-10-08 ENCOUNTER — Ambulatory Visit: Payer: 59 | Admitting: Internal Medicine

## 2017-01-07 ENCOUNTER — Other Ambulatory Visit: Payer: Self-pay | Admitting: Internal Medicine

## 2017-05-20 ENCOUNTER — Other Ambulatory Visit: Payer: Self-pay | Admitting: Internal Medicine

## 2017-05-20 NOTE — Telephone Encounter (Signed)
I have not seen her in over one year.  She has no f/u appt scheduled.  Needs a f/u appt scheduled and need to clarify how taking, but will have to have f/u appt.

## 2017-05-20 NOTE — Telephone Encounter (Signed)
Refilled: 01/07/2017 Last OV: 05/17/2017 Next OV: not scheduled

## 2017-06-01 ENCOUNTER — Other Ambulatory Visit: Payer: Self-pay

## 2017-06-01 MED ORDER — SERTRALINE HCL 25 MG PO TABS: 25.0000 mg | ORAL_TABLET | Freq: Every day | ORAL | 0 refills | Status: DC

## 2017-06-29 DIAGNOSIS — H40003 Preglaucoma, unspecified, bilateral: Secondary | ICD-10-CM | POA: Diagnosis not present

## 2017-06-29 DIAGNOSIS — H5203 Hypermetropia, bilateral: Secondary | ICD-10-CM | POA: Diagnosis not present

## 2017-09-03 ENCOUNTER — Encounter: Payer: Self-pay | Admitting: Internal Medicine

## 2017-09-05 ENCOUNTER — Encounter: Payer: Self-pay | Admitting: Internal Medicine

## 2017-09-05 ENCOUNTER — Telehealth: Payer: Self-pay

## 2017-09-05 MED ORDER — NEBIVOLOL HCL 2.5 MG PO TABS: 1.2500 mg | ORAL_TABLET | Freq: Every day | ORAL | 5 refills | Status: DC

## 2017-09-05 NOTE — Telephone Encounter (Signed)
rx refill for bystolic sent to pharmacy. Sent message via mychart to inform patient of refill.

## 2017-09-06 ENCOUNTER — Other Ambulatory Visit: Payer: Self-pay

## 2017-09-06 MED ORDER — NEBIVOLOL HCL 2.5 MG PO TABS: 1.2500 mg | ORAL_TABLET | Freq: Every day | ORAL | 5 refills | Status: DC

## 2017-12-12 ENCOUNTER — Ambulatory Visit (INDEPENDENT_AMBULATORY_CARE_PROVIDER_SITE_OTHER): Payer: 59 | Admitting: Internal Medicine

## 2017-12-12 ENCOUNTER — Encounter: Payer: Self-pay | Admitting: Internal Medicine

## 2017-12-12 VITALS — BP 142/80 | HR 85 | Temp 97.7°F | Resp 18 | Ht 61.0 in | Wt 228.8 lb

## 2017-12-12 DIAGNOSIS — R739 Hyperglycemia, unspecified: Secondary | ICD-10-CM | POA: Diagnosis not present

## 2017-12-12 DIAGNOSIS — I10 Essential (primary) hypertension: Secondary | ICD-10-CM

## 2017-12-12 DIAGNOSIS — Z1231 Encounter for screening mammogram for malignant neoplasm of breast: Secondary | ICD-10-CM | POA: Diagnosis not present

## 2017-12-12 DIAGNOSIS — R0602 Shortness of breath: Secondary | ICD-10-CM

## 2017-12-12 DIAGNOSIS — K439 Ventral hernia without obstruction or gangrene: Secondary | ICD-10-CM | POA: Diagnosis not present

## 2017-12-12 DIAGNOSIS — G4730 Sleep apnea, unspecified: Secondary | ICD-10-CM

## 2017-12-12 DIAGNOSIS — E7800 Pure hypercholesterolemia, unspecified: Secondary | ICD-10-CM | POA: Diagnosis not present

## 2017-12-12 DIAGNOSIS — Z0000 Encounter for general adult medical examination without abnormal findings: Secondary | ICD-10-CM | POA: Diagnosis not present

## 2017-12-12 DIAGNOSIS — R002 Palpitations: Secondary | ICD-10-CM

## 2017-12-12 DIAGNOSIS — Z1239 Encounter for other screening for malignant neoplasm of breast: Secondary | ICD-10-CM

## 2017-12-12 DIAGNOSIS — F439 Reaction to severe stress, unspecified: Secondary | ICD-10-CM

## 2017-12-12 LAB — CBC WITH DIFFERENTIAL/PLATELET
Basophils Absolute: 0.1 10*3/uL (ref 0.0–0.1)
Basophils Relative: 0.9 % (ref 0.0–3.0)
EOS ABS: 0.4 10*3/uL (ref 0.0–0.7)
EOS PCT: 5.3 % — AB (ref 0.0–5.0)
HCT: 38.7 % (ref 36.0–46.0)
Hemoglobin: 13.1 g/dL (ref 12.0–15.0)
Lymphocytes Relative: 32.1 % (ref 12.0–46.0)
Lymphs Abs: 2.3 10*3/uL (ref 0.7–4.0)
MCHC: 33.8 g/dL (ref 30.0–36.0)
MCV: 95.9 fl (ref 78.0–100.0)
MONO ABS: 0.5 10*3/uL (ref 0.1–1.0)
Monocytes Relative: 6.8 % (ref 3.0–12.0)
NEUTROS ABS: 4 10*3/uL (ref 1.4–7.7)
Neutrophils Relative %: 54.9 % (ref 43.0–77.0)
PLATELETS: 265 10*3/uL (ref 150.0–400.0)
RBC: 4.04 Mil/uL (ref 3.87–5.11)
RDW: 13.9 % (ref 11.5–15.5)
WBC: 7.2 10*3/uL (ref 4.0–10.5)

## 2017-12-12 LAB — HEPATIC FUNCTION PANEL
ALBUMIN: 4.3 g/dL (ref 3.5–5.2)
ALT: 21 U/L (ref 0–35)
AST: 17 U/L (ref 0–37)
Alkaline Phosphatase: 60 U/L (ref 39–117)
BILIRUBIN TOTAL: 0.3 mg/dL (ref 0.2–1.2)
Bilirubin, Direct: 0.1 mg/dL (ref 0.0–0.3)
Total Protein: 7.1 g/dL (ref 6.0–8.3)

## 2017-12-12 LAB — BASIC METABOLIC PANEL
BUN: 16 mg/dL (ref 6–23)
CALCIUM: 9.5 mg/dL (ref 8.4–10.5)
CO2: 29 meq/L (ref 19–32)
Chloride: 104 mEq/L (ref 96–112)
Creatinine, Ser: 0.7 mg/dL (ref 0.40–1.20)
GFR: 90.53 mL/min (ref >60.00)
GLUCOSE: 123 mg/dL — AB (ref 70–99)
POTASSIUM: 4.2 meq/L (ref 3.5–5.1)
SODIUM: 141 meq/L (ref 135–145)

## 2017-12-12 LAB — MICROALBUMIN / CREATININE URINE RATIO
CREATININE, U: 91.8 mg/dL
MICROALB/CREAT RATIO: 0.8 mg/g (ref 0.0–30.0)
Microalb, Ur: <0.7 mg/dL (ref 0.0–1.9)

## 2017-12-12 LAB — LIPID PANEL
CHOLESTEROL: 210 mg/dL — AB (ref 0–200)
HDL: 42.3 mg/dL (ref >39.00)
NonHDL: 167.34
TRIGLYCERIDES: 288 mg/dL — AB (ref 0.0–149.0)
Total CHOL/HDL Ratio: 5
VLDL: 57.6 mg/dL — AB (ref 0.0–40.0)

## 2017-12-12 LAB — HEMOGLOBIN A1C: HEMOGLOBIN A1C: 6.6 % — AB (ref 4.6–6.5)

## 2017-12-12 LAB — LDL CHOLESTEROL, DIRECT: Direct LDL: 130 mg/dL

## 2017-12-12 LAB — TSH: TSH: 3.75 u[IU]/mL (ref 0.35–4.50)

## 2017-12-12 MED ORDER — NEBIVOLOL HCL 2.5 MG PO TABS: 1.2500 mg | ORAL_TABLET | Freq: Every day | ORAL | 5 refills | Status: DC

## 2017-12-12 MED ORDER — SERTRALINE HCL 25 MG PO TABS: ORAL_TABLET | ORAL | 2 refills | Status: DC

## 2017-12-12 MED ORDER — HYDROCHLOROTHIAZIDE 12.5 MG PO CAPS: 12.5000 mg | ORAL_CAPSULE | Freq: Every day | ORAL | 2 refills | Status: DC

## 2017-12-12 NOTE — Progress Notes (Signed)
Patient ID: Margaret Mercado, female   DOB: February 22, 1956, 62 y.o.   MRN: [id number]   Subjective:    Patient ID: Margaret Mercado, female    DOB: March 11, 1956, 62 y.o.   MRN: [id number]  HPI  Patient here for her physical exam.  Have not seen her since 05/2016.  She has been out of hctz and off zoloft for a while.  Has a new job.  Working at [employer].  States she works from home.  Sits most of the day.  Not exercising.  Plans to start.  Has gained weight.  No chest pain.  Some sob with exertion.  Wakes up at night with some palpitations.  States when on zoloft, this did not occur.  No acid reflux.  No abdominal pain.  Bowels moving.  Has sleep apnea.  Unable to tolerate mask.  Discussed the need to be rechecked and treat.  She declines.  States her blood pressures have been averaging 140s/70s.     Past Medical History:  Diagnosis Date  . Abnormal echocardiogram 01/2009   Mild LVH, EF >55%, no regional wall motion abnormalities, normal RV, mild aortic insufficiency, no aortic stenosis.  . Anxiety   . Hyperlipidemia   . Hypertension    Edema with Norvasc. Unable to tolerate Lisinopril/ HCTZ.  Marland Kitchen Hypokalemia    Manifested by lip and finger tingling  . Palpitations    Event monitor 01/2009 with no significant arrhythmias  . TIA (transient ischemic attack) 01/2009   CT and MRI of head showed no evidence for stroke. Carotid dopplers showed no significant plaque   Past Surgical History:  Procedure Laterality Date  . CRYOABLATION     Of uterus  . UMBILICAL HERNIA REPAIR    . URETHRAL DILATION     removal of bladder polyps   Family History  Problem Relation Age of Onset  . Hypertension Mother   . Arrhythmia Mother        Atrial fibrillation  . Hypertension Father   . Hypertension Brother   . Hypertension Paternal Grandmother   . Coronary artery disease Neg Hx        Premature  . Breast cancer Neg Hx   . Colon cancer Neg Hx    Social History   Socioeconomic History  . Marital status:  Married    Spouse name: Not on file  . Number of children: 2  . Years of education: Not on file  . Highest education level: Not on file  Occupational History  . Occupation: Horticulturist, commercial    Comment: full time  Social Needs  . Financial resource strain: Not on file  . Food insecurity:    Worry: Not on file    Inability: Not on file  . Transportation needs:    Medical: Not on file    Non-medical: Not on file  Tobacco Use  . Smoking status: Never Smoker  . Smokeless tobacco: Never Used  Substance and Sexual Activity  . Alcohol use: No    Alcohol/week: 0.0 oz  . Drug use: No  . Sexual activity: Not on file  Lifestyle  . Physical activity:    Days per week: Not on file    Minutes per session: Not on file  . Stress: Not on file  Relationships  . Social connections:    Talks on phone: Not on file    Gets together: Not on file    Attends religious service: Not on file    Active member of club or  organization: Not on file    Attends meetings of clubs or organizations: Not on file    Relationship status: Not on file  Other Topics Concern  . Not on file  Social History Narrative   Married   Gets regular exercise    Outpatient Encounter Medications as of 12/12/2017  Medication Sig  . aspirin 81 MG EC tablet Take 81 mg by mouth daily.    . fish oil-omega-3 fatty acids 1000 MG capsule Take 0.75 capsules by mouth daily.    . hydrochlorothiazide (MICROZIDE) 12.5 MG capsule Take 1 capsule (12.5 mg total) by mouth daily.  Marland Kitchen LORazepam (ATIVAN) 0.5 MG tablet Take 1/2 table bid prn.  . nebivolol (BYSTOLIC) 2.5 MG tablet Take 0.5 tablets (1.25 mg total) by mouth daily.  . sertraline (ZOLOFT) 25 MG tablet One tablet bid  . [DISCONTINUED] hydrochlorothiazide (HYDRODIURIL) 25 MG tablet Take 1/2 tablet by mouth daily  . [DISCONTINUED] nebivolol (BYSTOLIC) 2.5 MG tablet TAKE ONE-HALF TABLET BY MOUTH DAILY--(DAILY DOSE IS 1.25MG)  . [DISCONTINUED] nebivolol (BYSTOLIC) 2.5 MG tablet Take  0.5 tablets (1.25 mg total) by mouth daily.  . [DISCONTINUED] sertraline (ZOLOFT) 25 MG tablet Take 3 tablets (75 mg total) by mouth daily.  . [DISCONTINUED] sertraline (ZOLOFT) 25 MG tablet Take 1 tablet (25 mg total) by mouth daily.   No facility-administered encounter medications on file as of 12/12/2017.     Review of Systems  Constitutional: Negative for appetite change and unexpected weight change.  HENT: Negative for congestion and sinus pressure.   Eyes: Negative for pain and visual disturbance.  Respiratory: Negative for cough and chest tightness.   Cardiovascular: Negative for chest pain and leg swelling.       Some sob with exertion.  Occasional palpitations at night.   Gastrointestinal: Negative for abdominal pain, diarrhea and nausea.  Genitourinary: Negative for difficulty urinating and dysuria.  Musculoskeletal: Negative for joint swelling and myalgias.  Skin: Negative for color change and rash.  Neurological: Negative for dizziness, light-headedness and headaches.  Hematological: Negative for adenopathy. Does not bruise/bleed easily.  Psychiatric/Behavioral: Negative for agitation and dysphoric mood.       Objective:    Physical Exam  Constitutional: She is oriented to person, place, and time. She appears well-developed and well-nourished. No distress.  HENT:  Nose: Nose normal.  Mouth/Throat: Oropharynx is clear and moist.  Eyes: Right eye exhibits no discharge. Left eye exhibits no discharge. No scleral icterus.  Neck: Neck supple. No thyromegaly present.  Cardiovascular: Normal rate and regular rhythm.  Pulmonary/Chest: Breath sounds normal. No accessory muscle usage. No tachypnea. No respiratory distress. She has no decreased breath sounds. She has no wheezes. She has no rhonchi. Right breast exhibits no inverted nipple, no mass, no nipple discharge and no tenderness (no axillary adenopathy). Left breast exhibits no inverted nipple, no mass, no nipple discharge and  no tenderness (no axilarry adenopathy).  Abdominal: Soft. Bowel sounds are normal. There is no tenderness.  Musculoskeletal: She exhibits no edema or tenderness.  Lymphadenopathy:    She has no cervical adenopathy.  Neurological: She is alert and oriented to person, place, and time.  Skin: No rash noted. No erythema.  Psychiatric: She has a normal mood and affect. Her behavior is normal.    BP (!) 142/80 (BP Location: Left Arm, Patient Position: Sitting, Cuff Size: Large)   Pulse 85   Temp 97.7 F (36.5 C) (Oral)   Resp 18   Ht '5\' 1"'  (1.549 m)   Wt  228 lb 12.8 oz (103.8 kg)   SpO2 97%   BMI 43.23 kg/m  Wt Readings from Last 3 Encounters:  12/12/17 228 lb 12.8 oz (103.8 kg)  05/17/16 219 lb 9.6 oz (99.6 kg)  04/23/15 223 lb 6 oz (101.3 kg)     Lab Results  Component Value Date   WBC 7.2 12/12/2017   HGB 13.1 12/12/2017   HCT 38.7 12/12/2017   PLT 265.0 12/12/2017   GLUCOSE 123 (H) 12/12/2017   CHOL 210 (H) 12/12/2017   TRIG 288.0 (H) 12/12/2017   HDL 42.30 12/12/2017   LDLDIRECT 130.0 12/12/2017   LDLCALC 141 (H) 08/15/2013   ALT 21 12/12/2017   AST 17 12/12/2017   NA 141 12/12/2017   K 4.2 12/12/2017   CL 104 12/12/2017   CREATININE 0.70 12/12/2017   BUN 16 12/12/2017   CO2 29 12/12/2017   TSH 3.75 12/12/2017   HGBA1C 6.6 (H) 12/12/2017   MICROALBUR <0.7 12/12/2017    Mm Screening Breast Tomo Bilateral  Result Date: 06/25/2016 CLINICAL DATA:  Screening. EXAM: 2D DIGITAL SCREENING BILATERAL MAMMOGRAM WITH CAD AND ADJUNCT TOMO COMPARISON:  Previous exam(s). ACR Breast Density Category b: There are scattered areas of fibroglandular density. FINDINGS: There are no findings suspicious for malignancy. Images were processed with CAD. IMPRESSION: No mammographic evidence of malignancy. A result letter of this screening mammogram will be mailed directly to the patient. RECOMMENDATION: Screening mammogram in one year. (Code:SM-B-01Y) BI-RADS CATEGORY  1: Negative.  Electronically Signed   By: Curlene Dolphin M.D.   On: 06/25/2016 07:41       Assessment & Plan:   Problem List Items Addressed This Visit    Essential hypertension    Blood pressure elevated.  On bystolic.  Off hctz.  Did well with hctz.  Add back.  (HCTZ 12.76m q day).  Follow pressure.  Follow metabolic panel.        Relevant Medications   nebivolol (BYSTOLIC) 2.5 MG tablet   hydrochlorothiazide (MICROZIDE) 12.5 MG capsule   Other Relevant Orders   CBC with Differential/Platelet (Completed)   TSH (Completed)   Basic metabolic panel (Completed)   Healthcare maintenance    Physical today 12/12/17.  Overdue mammogram.  Ordered.  She will schedule.  PAP 05/27/16 - negative with negative HPV.  Discussed again with her regarding the need for colon cancer screening.  She prefers cologuard.  Form completed.       Hernia of abdominal wall    Saw Dr STamala Julianpreviously.  Feels is getting better.  No pain.  Wants to hold on any further intervention at this time.  Follow.        Hypercholesterolemia    Low cholesterol diet and exercise.  She declines cholesterol medication.  Follow lipid panel.        Relevant Medications   nebivolol (BYSTOLIC) 2.5 MG tablet   hydrochlorothiazide (MICROZIDE) 12.5 MG capsule   Other Relevant Orders   Hepatic function panel (Completed)   Lipid panel (Completed)   Hyperglycemia    Low carb diet and exercise.  Follow met b and a1c.        Relevant Orders   Hemoglobin A1c (Completed)   Microalbumin / creatinine urine ratio (Completed)   Severe obesity (BMI >= 40) (HCC)    Discussed diet and exercise.  Follow.       Sleep apnea    Has a history of sleep apnea.  Discussed risk of untreated sleep apnea.  She declines any further  evaluation or treatment at this time. Follow.        SOB (shortness of breath) on exertion    Some sob with exertion.  Occasional palpitations at night.  Declines reevaluation/treatment for sleep apnea.  EKG - SR with no acute  ischemic changes.  Given risk factors and symptoms, refer to cardiology for further evaluation.        Stress    Some increased stress.  Discussed with her today.  She feels being off zoloft contributing to the increased palpitations.  Will restart zoloft.  She wants to take bid.  Start 79m bid.  Follow.         Other Visit Diagnoses    Breast cancer screening    -  Primary   Relevant Orders   MM 3D SCREEN BREAST BILATERAL   Palpitations       Relevant Orders   EKG 12-Lead (Completed)       SEinar Pheasant MD

## 2017-12-13 ENCOUNTER — Encounter: Payer: Self-pay | Admitting: Internal Medicine

## 2017-12-13 DIAGNOSIS — R0602 Shortness of breath: Secondary | ICD-10-CM

## 2017-12-13 DIAGNOSIS — R0609 Other forms of dyspnea: Secondary | ICD-10-CM | POA: Insufficient documentation

## 2017-12-13 DIAGNOSIS — F439 Reaction to severe stress, unspecified: Secondary | ICD-10-CM | POA: Insufficient documentation

## 2017-12-13 NOTE — Assessment & Plan Note (Signed)
Has a history of sleep apnea.  Discussed risk of untreated sleep apnea.  She declines any further evaluation or treatment at this time. Follow.

## 2017-12-13 NOTE — Assessment & Plan Note (Signed)
Saw Dr Tamala Julian previously.  Feels is getting better.  No pain.  Wants to hold on any further intervention at this time.  Follow.

## 2017-12-13 NOTE — Assessment & Plan Note (Signed)
Some sob with exertion.  Occasional palpitations at night.  Declines reevaluation/treatment for sleep apnea.  EKG - SR with no acute ischemic changes.  Given risk factors and symptoms, refer to cardiology for further evaluation.

## 2017-12-13 NOTE — Assessment & Plan Note (Signed)
Physical today 12/12/17.  Overdue mammogram.  Ordered.  She will schedule.  PAP 05/27/16 - negative with negative HPV.  Discussed again with her regarding the need for colon cancer screening.  She prefers cologuard.  Form completed.

## 2017-12-13 NOTE — Assessment & Plan Note (Signed)
Blood pressure elevated.  On bystolic.  Off hctz.  Did well with hctz.  Add back.  (HCTZ 12.5mg  q day).  Follow pressure.  Follow metabolic panel.

## 2017-12-13 NOTE — Assessment & Plan Note (Signed)
Low carb diet and exercise.  Follow met b and a1c.   

## 2017-12-13 NOTE — Assessment & Plan Note (Signed)
Discussed diet and exercise.  Follow.  

## 2017-12-13 NOTE — Assessment & Plan Note (Signed)
Low cholesterol diet and exercise.  She declines cholesterol medication.  Follow lipid panel.  

## 2017-12-13 NOTE — Assessment & Plan Note (Signed)
Some increased stress.  Discussed with her today.  She feels being off zoloft contributing to the increased palpitations.  Will restart zoloft.  She wants to take bid.  Start 25mg  bid.  Follow.

## 2018-02-14 ENCOUNTER — Encounter: Payer: Self-pay | Admitting: Internal Medicine

## 2018-03-10 DIAGNOSIS — Z1211 Encounter for screening for malignant neoplasm of colon: Secondary | ICD-10-CM | POA: Diagnosis not present

## 2018-03-10 DIAGNOSIS — Z1212 Encounter for screening for malignant neoplasm of rectum: Secondary | ICD-10-CM | POA: Diagnosis not present

## 2018-03-10 LAB — COLOGUARD: Cologuard: NEGATIVE

## 2018-03-28 ENCOUNTER — Ambulatory Visit
Admission: RE | Admit: 2018-03-28 | Discharge: 2018-03-28 | Disposition: A | Discharge status: Home / Self Care | Payer: 59 | Source: Ambulatory Visit | Attending: Internal Medicine | Admitting: Internal Medicine

## 2018-03-28 DIAGNOSIS — Z1239 Encounter for other screening for malignant neoplasm of breast: Secondary | ICD-10-CM | POA: Insufficient documentation

## 2018-03-28 DIAGNOSIS — Z1231 Encounter for screening mammogram for malignant neoplasm of breast: Secondary | ICD-10-CM | POA: Diagnosis not present

## 2018-03-29 ENCOUNTER — Other Ambulatory Visit: Payer: Self-pay | Admitting: Internal Medicine

## 2018-03-29 DIAGNOSIS — R928 Other abnormal and inconclusive findings on diagnostic imaging of breast: Secondary | ICD-10-CM

## 2018-03-30 ENCOUNTER — Other Ambulatory Visit: Payer: Self-pay | Admitting: Internal Medicine

## 2018-03-30 DIAGNOSIS — R928 Other abnormal and inconclusive findings on diagnostic imaging of breast: Secondary | ICD-10-CM

## 2018-03-30 NOTE — Progress Notes (Signed)
Order placed for f/u right breast mammo and ultrasound.

## 2018-04-03 ENCOUNTER — Ambulatory Visit: Payer: 59 | Admitting: Internal Medicine

## 2018-04-06 ENCOUNTER — Ambulatory Visit (INDEPENDENT_AMBULATORY_CARE_PROVIDER_SITE_OTHER): Payer: 59 | Admitting: Internal Medicine

## 2018-04-06 ENCOUNTER — Encounter: Payer: Self-pay | Admitting: Internal Medicine

## 2018-04-06 DIAGNOSIS — R739 Hyperglycemia, unspecified: Secondary | ICD-10-CM | POA: Diagnosis not present

## 2018-04-06 DIAGNOSIS — I10 Essential (primary) hypertension: Secondary | ICD-10-CM | POA: Diagnosis not present

## 2018-04-06 DIAGNOSIS — F439 Reaction to severe stress, unspecified: Secondary | ICD-10-CM

## 2018-04-06 DIAGNOSIS — E7800 Pure hypercholesterolemia, unspecified: Secondary | ICD-10-CM

## 2018-04-06 DIAGNOSIS — K439 Ventral hernia without obstruction or gangrene: Secondary | ICD-10-CM

## 2018-04-06 NOTE — Progress Notes (Signed)
Patient ID: Margaret Mercado, female   DOB: 1955/09/25, 62 y.o.   MRN: [id number]   Subjective:    Patient ID: Margaret Mercado, female    DOB: 11/30/1955, 62 y.o.   MRN: [id number]  HPI  Patient here for a scheduled follow up.   She reports she is doing better.  Has adjusted her diet.  Watching her diet better.  Plans to start exercising more.  Has lost weight.  Feels better.  No chest pain.  No sob.  Taking zoloft.  Doing well on this medication.  States since starting back on zoloft, the palpitations/increased heart rate have improved.  She desires no f/u with cardiology.  On bystolic.  On hctz.  Blood pressure better.  Had flu shot yesterday.  Did return cologuard.  Do not have results.     Past Medical History:  Diagnosis Date  . Abnormal echocardiogram 01/2009   Mild LVH, EF >55%, no regional wall motion abnormalities, normal RV, mild aortic insufficiency, no aortic stenosis.  . Anxiety   . Hyperlipidemia   . Hypertension    Edema with Norvasc. Unable to tolerate Lisinopril/ HCTZ.  Marland Kitchen Hypokalemia    Manifested by lip and finger tingling  . Palpitations    Event monitor 01/2009 with no significant arrhythmias  . TIA (transient ischemic attack) 01/2009   CT and MRI of head showed no evidence for stroke. Carotid dopplers showed no significant plaque   Past Surgical History:  Procedure Laterality Date  . CRYOABLATION     Of uterus  . UMBILICAL HERNIA REPAIR    . URETHRAL DILATION     removal of bladder polyps   Family History  Problem Relation Age of Onset  . Hypertension Mother   . Arrhythmia Mother        Atrial fibrillation  . Hypertension Father   . Hypertension Brother   . Hypertension Paternal Grandmother   . Coronary artery disease Neg Hx        Premature  . Breast cancer Neg Hx   . Colon cancer Neg Hx    Social History   Socioeconomic History  . Marital status: Married    Spouse name: Not on file  . Number of children: 2  . Years of education: Not on file    . Highest education level: Not on file  Occupational History  . Occupation: Horticulturist, commercial    Comment: full time  Social Needs  . Financial resource strain: Not on file  . Food insecurity:    Worry: Not on file    Inability: Not on file  . Transportation needs:    Medical: Not on file    Non-medical: Not on file  Tobacco Use  . Smoking status: Never Smoker  . Smokeless tobacco: Never Used  Substance and Sexual Activity  . Alcohol use: No    Alcohol/week: 0.0 standard drinks  . Drug use: No  . Sexual activity: Not on file  Lifestyle  . Physical activity:    Days per week: Not on file    Minutes per session: Not on file  . Stress: Not on file  Relationships  . Social connections:    Talks on phone: Not on file    Gets together: Not on file    Attends religious service: Not on file    Active member of club or organization: Not on file    Attends meetings of clubs or organizations: Not on file    Relationship status: Not on  file  Other Topics Concern  . Not on file  Social History Narrative   Married   Gets regular exercise    Outpatient Encounter Medications as of 04/06/2018  Medication Sig  . aspirin 81 MG EC tablet Take 81 mg by mouth daily.    . fish oil-omega-3 fatty acids 1000 MG capsule Take 0.75 capsules by mouth daily.    . hydrochlorothiazide (MICROZIDE) 12.5 MG capsule Take 1 capsule (12.5 mg total) by mouth daily.  Marland Kitchen LORazepam (ATIVAN) 0.5 MG tablet Take 1/2 table bid prn.  . nebivolol (BYSTOLIC) 2.5 MG tablet Take 0.5 tablets (1.25 mg total) by mouth daily.  . sertraline (ZOLOFT) 25 MG tablet One tablet bid   No facility-administered encounter medications on file as of 04/06/2018.     Review of Systems  Constitutional: Negative for appetite change and unexpected weight change.  HENT: Negative for congestion and sinus pressure.   Respiratory: Negative for cough, chest tightness and shortness of breath.   Cardiovascular: Negative for chest pain,  palpitations and leg swelling.  Gastrointestinal: Negative for abdominal pain, diarrhea, nausea and vomiting.  Genitourinary: Negative for difficulty urinating and dysuria.  Musculoskeletal: Negative for joint swelling and myalgias.  Skin: Negative for color change and rash.  Neurological: Negative for dizziness, light-headedness and headaches.  Psychiatric/Behavioral: Negative for agitation and dysphoric mood.       Objective:    Physical Exam  Constitutional: She appears well-developed and well-nourished. No distress.  HENT:  Nose: Nose normal.  Mouth/Throat: Oropharynx is clear and moist.  Neck: Neck supple. No thyromegaly present.  Cardiovascular: Normal rate and regular rhythm.  Pulmonary/Chest: Breath sounds normal. No respiratory distress. She has no wheezes.  Abdominal: Soft. Bowel sounds are normal. There is no tenderness.  Musculoskeletal: She exhibits no edema or tenderness.  Lymphadenopathy:    She has no cervical adenopathy.  Skin: No rash noted. No erythema.  Psychiatric: She has a normal mood and affect. Her behavior is normal.    BP 138/80 (BP Location: Right Arm, Patient Position: Sitting, Cuff Size: Normal)   Pulse 63   Temp (!) 97.5 F (36.4 C) (Oral)   Resp 16   Ht 5' 1" (1.549 m)   Wt 214 lb 4 oz (97.2 kg)   SpO2 97%   BMI 40.48 kg/m  Wt Readings from Last 3 Encounters:  04/06/18 214 lb 4 oz (97.2 kg)  12/12/17 228 lb 12.8 oz (103.8 kg)  05/17/16 219 lb 9.6 oz (99.6 kg)     Lab Results  Component Value Date   WBC 7.2 12/12/2017   HGB 13.1 12/12/2017   HCT 38.7 12/12/2017   PLT 265.0 12/12/2017   GLUCOSE 123 (H) 12/12/2017   CHOL 210 (H) 12/12/2017   TRIG 288.0 (H) 12/12/2017   HDL 42.30 12/12/2017   LDLDIRECT 130.0 12/12/2017   LDLCALC 141 (H) 08/15/2013   ALT 21 12/12/2017   AST 17 12/12/2017   NA 141 12/12/2017   K 4.2 12/12/2017   CL 104 12/12/2017   CREATININE 0.70 12/12/2017   BUN 16 12/12/2017   CO2 29 12/12/2017   TSH 3.75  12/12/2017   HGBA1C 6.6 (H) 12/12/2017   MICROALBUR <0.7 12/12/2017    Mm 3d Screen Breast Bilateral  Result Date: 03/29/2018 CLINICAL DATA:  Screening. EXAM: DIGITAL SCREENING BILATERAL MAMMOGRAM WITH TOMO AND CAD COMPARISON:  Previous exam(s). ACR Breast Density Category b: There are scattered areas of fibroglandular density. FINDINGS: In the right breast, a possible mass warrants further evaluation.  In the left breast, no findings suspicious for malignancy. Images were processed with CAD. IMPRESSION: Further evaluation is suggested for possible mass in the right breast. RECOMMENDATION: Diagnostic mammogram and possibly ultrasound of the right breast. (Code:[id number]-43M) The patient will be contacted regarding the findings, and additional imaging will be scheduled. BI-RADS CATEGORY  0: Incomplete. Need additional imaging evaluation and/or prior mammograms for comparison. Electronically Signed   By: Lillia Mountain M.D.   On: 03/29/2018 09:20       Assessment & Plan:   Problem List Items Addressed This Visit    Essential hypertension    Blood pressure doing better.  Continue same medication regimen.  Follow pressures.  Follow metabolic panel.        Relevant Orders   Basic metabolic panel   Hernia of abdominal wall    Wants to hold on any further intervention.  Follow.        Hypercholesterolemia    She declines cholesterol medication.  Low cholesterol diet and exercise.  Follow lipid panel.        Relevant Orders   Hepatic function panel   Lipid panel   Hyperglycemia    Low carb diet and exercise.  Follow met b and a1c.        Relevant Orders   Hemoglobin A1c   Severe obesity (BMI >= 40) (HCC)    Has adjusted her diet.  Losing weight.  Follow.        Stress    Handling stress. Doing better. On zoloft.  Follow.           Einar Pheasant, MD

## 2018-04-09 ENCOUNTER — Encounter: Payer: Self-pay | Admitting: Internal Medicine

## 2018-04-09 NOTE — Assessment & Plan Note (Signed)
Blood pressure doing better.  Continue same medication regimen.  Follow pressures.  Follow metabolic panel.   

## 2018-04-09 NOTE — Assessment & Plan Note (Signed)
Handling stress. Doing better. On zoloft.  Follow.

## 2018-04-09 NOTE — Assessment & Plan Note (Signed)
Has adjusted her diet.  Losing weight.  Follow.

## 2018-04-09 NOTE — Assessment & Plan Note (Signed)
Low carb diet and exercise.  Follow met b and a1c.   

## 2018-04-09 NOTE — Assessment & Plan Note (Signed)
Wants to hold on any further intervention.  Follow.

## 2018-04-09 NOTE — Assessment & Plan Note (Signed)
She declines cholesterol medication.  Low cholesterol diet and exercise.  Follow lipid panel.   

## 2018-04-10 ENCOUNTER — Ambulatory Visit
Admission: RE | Admit: 2018-04-10 | Discharge: 2018-04-10 | Disposition: A | Discharge status: Home / Self Care | Payer: 59 | Source: Ambulatory Visit | Attending: Internal Medicine | Admitting: Internal Medicine

## 2018-04-10 DIAGNOSIS — R928 Other abnormal and inconclusive findings on diagnostic imaging of breast: Secondary | ICD-10-CM | POA: Insufficient documentation

## 2018-04-10 DIAGNOSIS — N6489 Other specified disorders of breast: Secondary | ICD-10-CM | POA: Diagnosis not present

## 2018-04-14 ENCOUNTER — Other Ambulatory Visit: Payer: Self-pay | Admitting: Internal Medicine

## 2018-04-14 DIAGNOSIS — R928 Other abnormal and inconclusive findings on diagnostic imaging of breast: Secondary | ICD-10-CM

## 2018-04-14 NOTE — Progress Notes (Signed)
Order placed for surgery referral - Dr Bary Castilla - for f/u abnormal mammogram.

## 2018-05-02 ENCOUNTER — Encounter: Payer: Self-pay | Admitting: General Surgery

## 2018-05-02 ENCOUNTER — Other Ambulatory Visit: Payer: Self-pay

## 2018-05-02 ENCOUNTER — Ambulatory Visit (INDEPENDENT_AMBULATORY_CARE_PROVIDER_SITE_OTHER): Payer: 59 | Admitting: General Surgery

## 2018-05-02 VITALS — BP 135/88 | HR 76 | Temp 97.9°F | Resp 16 | Ht 61.0 in | Wt 216.0 lb

## 2018-05-02 DIAGNOSIS — R928 Other abnormal and inconclusive findings on diagnostic imaging of breast: Secondary | ICD-10-CM

## 2018-05-02 NOTE — Progress Notes (Signed)
Patient ID: Margaret Mercado, female   DOB: [birth year], 62 y.o.   MRN: [id number]  Chief Complaint  Patient presents with  . New Patient (Initial Visit)    abnormal mammogram    HPI Margaret Mercado is a 62 y.o. female.  Here today for right breast mass seen on  mammogram.  Denies feeling any lumps. Denies skin changes, no nipple drainage. No family history of breast cancer. No HRT therapy in the past.  HPI  Past Medical History:  Diagnosis Date  . Abnormal echocardiogram 01/2009   Mild LVH, EF >55%, no regional wall motion abnormalities, normal RV, mild aortic insufficiency, no aortic stenosis.  . Anxiety   . Hyperlipidemia   . Hypertension    Edema with Norvasc. Unable to tolerate Lisinopril/ HCTZ.  Marland Kitchen Hypokalemia    Manifested by lip and finger tingling  . Palpitations    Event monitor 01/2009 with no significant arrhythmias  . TIA (transient ischemic attack) 01/2009   CT and MRI of head showed no evidence for stroke. Carotid dopplers showed no significant plaque    Past Surgical History:  Procedure Laterality Date  . CRYOABLATION     Of uterus  . UMBILICAL HERNIA REPAIR    . URETHRAL DILATION     removal of bladder polyps    Family History  Problem Relation Age of Onset  . Hypertension Mother   . Arrhythmia Mother        Atrial fibrillation  . Hypertension Father   . Hypertension Brother   . Hypertension Paternal Grandmother   . Coronary artery disease Neg Hx        Premature  . Breast cancer Neg Hx   . Colon cancer Neg Hx     Social History Social History   Tobacco Use  . Smoking status: Never Smoker  . Smokeless tobacco: Never Used  Substance Use Topics  . Alcohol use: No    Alcohol/week: 0.0 standard drinks  . Drug use: No    Allergies  Allergen Reactions  . Vicodin [Hydrocodone-Acetaminophen] Anaphylaxis  . Amlodipine Besylate   . Cardizem [Diltiazem Hcl] Other (See Comments)    Flushing   . Erythromycin Other (See Comments)    Chest  heaviness   . Hydrocodone-Acetaminophen   . Levaquin [Levofloxacin In D5w] Other (See Comments)    Rapid heart beat  . Lisinopril Other (See Comments)    Difficulty breathing and rash  . Macrobid [Nitrofurantoin Macrocrystal] Other (See Comments)    Wheezing and headache  . Oxycodone-Acetaminophen   . Penicillins Rash  . Sulfonamide Derivatives Rash    Current Outpatient Medications  Medication Sig Dispense Refill  . aspirin 81 MG EC tablet Take 81 mg by mouth daily.      . fish oil-omega-3 fatty acids 1000 MG capsule Take 0.75 capsules by mouth daily.      . hydrochlorothiazide (MICROZIDE) 12.5 MG capsule Take 1 capsule (12.5 mg total) by mouth daily. 30 capsule 2  . LORazepam (ATIVAN) 0.5 MG tablet Take 1/2 table bid prn. 20 tablet 0  . nebivolol (BYSTOLIC) 2.5 MG tablet Take 0.5 tablets (1.25 mg total) by mouth daily. 30 tablet 5  . sertraline (ZOLOFT) 25 MG tablet One tablet bid 60 tablet 2   No current facility-administered medications for this visit.     Review of Systems Review of Systems  Constitutional: Negative.   Cardiovascular: Negative.  Negative for chest pain, palpitations and leg swelling.       Mild dyspnea after  climbing 1 flight of steps.  Skin: Negative.     Blood pressure 135/88, pulse 76, temperature 97.9 F (36.6 C), temperature source Temporal, resp. rate 16, height 5\' 1"  (1.549 m), weight 216 lb (98 kg), SpO2 95 %.  Physical Exam Physical Exam  Constitutional: She is oriented to person, place, and time. She appears well-developed and well-nourished.  Eyes: Conjunctivae are normal. No scleral icterus.  Neck: Normal range of motion.  Cardiovascular: Normal rate and regular rhythm.  Murmur heard.  Systolic murmur is present with a grade of 2/6. Pulmonary/Chest: Effort normal and breath sounds normal.  Abdominal: A hernia is present.  Lymphadenopathy:    She has no cervical adenopathy.  Neurological: She is alert and oriented to person, place, and  time.  Skin: Skin is warm and dry.    Data Reviewed Screening mammogram of March 29, 2018 as well as diagnostic right breast mammogram of April 10, 2018 reviewed.  Small new subtle density compared to 2000 18/2015 exams.  BI-RADS-4.  No corresponding lesion on ultrasound.  Recommendation for stereotactic biopsy.  The patient had an echocardiogram in 2010 which identified mild aortic insufficiency, without evidence of aortic stenosis.  Assessment    Small, subtle area of change now 21 months after her most recent mammograms.    Ventral hernia.  II/VI systolic murmur over the aortic outflow tract with recently noted mild dyspnea after climbing a flight of steps.  Plan     Options for management of this new mammographic abnormality were reviewed: 1) proceed directly to stereotactic biopsy is recommended by radiology versus 2) short-term follow-up (6 months).  The area of concern is small and may be related to progressive regression of the residual breast parenchyma in the retroareolar area, or could represent an early malignancy.  A six-month follow-up will not preclude any options for management nor would allow change in stage if indeed this present lesion is a malignancy.  At this time, the patient is most interested in observation, and she is aware that should she change her mind in the interval we will arrange for stereotactic biopsy with radiology service.  Schedule right diagnostic mammogram in April 2020.  The patient does have a significant ventral hernia, this was discussed almost at the checkout desk as a "oh by the by".  She will contact the office if she would like to have formal evaluation and review options for management of this issue.    The patient is asked to touch base with her PCP regarding the dyspnea with exertion.  HPI, Physical Exam, Assessment and Plan have been scribed under the direction and in the presence of Robert Bellow, MD. Jonnie Finner,  CMA  I have completed the exam and reviewed the above documentation for accuracy and completeness.  I agree with the above.  Haematologist has been used and any errors in dictation or transcription are unintentional.  Hervey Ard, M.D., F.A.C.S.  Forest Gleason Kanden Carey 05/03/2018, 6:01 PM

## 2018-05-02 NOTE — Patient Instructions (Signed)
Schedule right diagnostic mammogram in April 2020.

## 2018-05-03 DIAGNOSIS — R928 Other abnormal and inconclusive findings on diagnostic imaging of breast: Secondary | ICD-10-CM | POA: Insufficient documentation

## 2018-05-05 ENCOUNTER — Other Ambulatory Visit: Payer: Self-pay | Admitting: Internal Medicine

## 2018-07-04 DIAGNOSIS — H5203 Hypermetropia, bilateral: Secondary | ICD-10-CM | POA: Diagnosis not present

## 2018-08-02 ENCOUNTER — Encounter: Payer: Self-pay | Admitting: Internal Medicine

## 2018-08-02 ENCOUNTER — Ambulatory Visit (INDEPENDENT_AMBULATORY_CARE_PROVIDER_SITE_OTHER): Payer: 59 | Admitting: Internal Medicine

## 2018-08-02 DIAGNOSIS — R928 Other abnormal and inconclusive findings on diagnostic imaging of breast: Secondary | ICD-10-CM

## 2018-08-02 DIAGNOSIS — F439 Reaction to severe stress, unspecified: Secondary | ICD-10-CM

## 2018-08-02 DIAGNOSIS — G4730 Sleep apnea, unspecified: Secondary | ICD-10-CM | POA: Diagnosis not present

## 2018-08-02 DIAGNOSIS — R0602 Shortness of breath: Secondary | ICD-10-CM | POA: Diagnosis not present

## 2018-08-02 DIAGNOSIS — R739 Hyperglycemia, unspecified: Secondary | ICD-10-CM

## 2018-08-02 DIAGNOSIS — I10 Essential (primary) hypertension: Secondary | ICD-10-CM | POA: Diagnosis not present

## 2018-08-02 DIAGNOSIS — K439 Ventral hernia without obstruction or gangrene: Secondary | ICD-10-CM

## 2018-08-02 DIAGNOSIS — E7800 Pure hypercholesterolemia, unspecified: Secondary | ICD-10-CM | POA: Diagnosis not present

## 2018-08-02 LAB — LDL CHOLESTEROL, DIRECT: Direct LDL: 153 mg/dL

## 2018-08-02 LAB — HEMOGLOBIN A1C: HEMOGLOBIN A1C: 5.9 % (ref 4.6–6.5)

## 2018-08-02 LAB — BASIC METABOLIC PANEL
BUN: 16 mg/dL (ref 6–23)
CO2: 31 mEq/L (ref 19–32)
Calcium: 9.8 mg/dL (ref 8.4–10.5)
Chloride: 98 mEq/L (ref 96–112)
Creatinine, Ser: 0.7 mg/dL (ref 0.40–1.20)
GFR: 85 mL/min (ref >60.00)
GLUCOSE: 116 mg/dL — AB (ref 70–99)
Potassium: 3.8 mEq/L (ref 3.5–5.1)
SODIUM: 139 meq/L (ref 135–145)

## 2018-08-02 LAB — LIPID PANEL
Cholesterol: 228 mg/dL — ABNORMAL HIGH (ref 0–200)
HDL: 44.7 mg/dL (ref >39.00)
NonHDL: 182.81
Total CHOL/HDL Ratio: 5
Triglycerides: 210 mg/dL — ABNORMAL HIGH (ref 0.0–149.0)
VLDL: 42 mg/dL — ABNORMAL HIGH (ref 0.0–40.0)

## 2018-08-02 LAB — HEPATIC FUNCTION PANEL
ALT: 16 U/L (ref 0–35)
AST: 13 U/L (ref 0–37)
Albumin: 4.5 g/dL (ref 3.5–5.2)
Alkaline Phosphatase: 51 U/L (ref 39–117)
Bilirubin, Direct: 0 mg/dL (ref 0.0–0.3)
Total Bilirubin: 0.4 mg/dL (ref 0.2–1.2)
Total Protein: 7.4 g/dL (ref 6.0–8.3)

## 2018-08-02 MED ORDER — NEBIVOLOL HCL 2.5 MG PO TABS: 1.2500 mg | ORAL_TABLET | Freq: Every day | ORAL | 5 refills | Status: DC

## 2018-08-02 MED ORDER — HYDROCHLOROTHIAZIDE 12.5 MG PO CAPS: 12.5000 mg | ORAL_CAPSULE | Freq: Every day | ORAL | 5 refills | Status: DC

## 2018-08-02 NOTE — Progress Notes (Signed)
Patient ID: Margaret Mercado, female   DOB: [birth year], 63 y.o.   MRN: [id number]   Subjective:    Patient ID: Margaret Mercado, female    DOB: 06/20/1955, 63 y.o.   MRN: [id number]  HPI  Patient here for a scheduled follow up.  She has seen Dr Bary Castilla for abnormal mammogram.  Elected to follow.  Reviewed note.  Plan was for 6 month f/u diagnostic mammogram in 09/2018.  She was questioning when this would be scheduled.  Reports some sob with exertion.  States notices when not as active.  Once she starts exercising more, does appear to improve.  Has murmur.  Some sob with stairs.  Discussed cardiology referral for further evaluation, especially given she is considering hernia surgery in the near future.  No chest pain.  No increased cough or congestion.  Handling stress.  Blood pressure stable.  Discussed sleep study.  Wants to hold at this time.     Past Medical History:  Diagnosis Date  . Abnormal echocardiogram 01/2009   Mild LVH, EF >55%, no regional wall motion abnormalities, normal RV, mild aortic insufficiency, no aortic stenosis.  . Anxiety   . Hyperlipidemia   . Hypertension    Edema with Norvasc. Unable to tolerate Lisinopril/ HCTZ.  Marland Kitchen Hypokalemia    Manifested by lip and finger tingling  . Palpitations    Event monitor 01/2009 with no significant arrhythmias  . TIA (transient ischemic attack) 01/2009   CT and MRI of head showed no evidence for stroke. Carotid dopplers showed no significant plaque   Past Surgical History:  Procedure Laterality Date  . CRYOABLATION     Of uterus  . UMBILICAL HERNIA REPAIR    . URETHRAL DILATION     removal of bladder polyps   Family History  Problem Relation Age of Onset  . Hypertension Mother   . Arrhythmia Mother        Atrial fibrillation  . Hypertension Father   . Hypertension Brother   . Hypertension Paternal Grandmother   . Coronary artery disease Neg Hx        Premature  . Breast cancer Neg Hx   . Colon cancer Neg Hx     Social History   Socioeconomic History  . Marital status: Married    Spouse name: Not on file  . Number of children: 2  . Years of education: Not on file  . Highest education level: Not on file  Occupational History  . Occupation: Horticulturist, commercial    Comment: full time  Social Needs  . Financial resource strain: Not on file  . Food insecurity:    Worry: Not on file    Inability: Not on file  . Transportation needs:    Medical: Not on file    Non-medical: Not on file  Tobacco Use  . Smoking status: Never Smoker  . Smokeless tobacco: Never Used  Substance and Sexual Activity  . Alcohol use: No    Alcohol/week: 0.0 standard drinks  . Drug use: No  . Sexual activity: Not on file  Lifestyle  . Physical activity:    Days per week: Not on file    Minutes per session: Not on file  . Stress: Not on file  Relationships  . Social connections:    Talks on phone: Not on file    Gets together: Not on file    Attends religious service: Not on file    Active member of club or organization: Not  on file    Attends meetings of clubs or organizations: Not on file    Relationship status: Not on file  Other Topics Concern  . Not on file  Social History Narrative   Married   Gets regular exercise    Outpatient Encounter Medications as of 08/02/2018  Medication Sig  . aspirin 81 MG EC tablet Take 81 mg by mouth daily.    . fish oil-omega-3 fatty acids 1000 MG capsule Take 0.75 capsules by mouth daily.    . hydrochlorothiazide (MICROZIDE) 12.5 MG capsule Take 1 capsule (12.5 mg total) by mouth daily.  Marland Kitchen LORazepam (ATIVAN) 0.5 MG tablet Take 1/2 table bid prn.  . nebivolol (BYSTOLIC) 2.5 MG tablet Take 0.5 tablets (1.25 mg total) by mouth daily.  . sertraline (ZOLOFT) 25 MG tablet One tablet bid  . [DISCONTINUED] hydrochlorothiazide (MICROZIDE) 12.5 MG capsule TAKE 1 CAPSULE (12.5 MG TOTAL) BY MOUTH DAILY.  . [DISCONTINUED] nebivolol (BYSTOLIC) 2.5 MG tablet Take 0.5 tablets (1.25  mg total) by mouth daily.   No facility-administered encounter medications on file as of 08/02/2018.     Review of Systems  Constitutional: Negative for appetite change and unexpected weight change.  HENT: Negative for congestion and sinus pressure.   Respiratory: Positive for shortness of breath. Negative for cough and chest tightness.   Cardiovascular: Negative for chest pain, palpitations and leg swelling.  Gastrointestinal: Negative for abdominal pain, diarrhea, nausea and vomiting.  Genitourinary: Negative for difficulty urinating and dysuria.  Musculoskeletal: Negative for joint swelling and myalgias.  Skin: Negative for color change and rash.  Neurological: Negative for dizziness, light-headedness and headaches.  Psychiatric/Behavioral: Negative for agitation and dysphoric mood.       Objective:    Physical Exam Constitutional:      General: She is not in acute distress.    Appearance: Normal appearance.  HENT:     Nose: Nose normal. No congestion.     Mouth/Throat:     Pharynx: No oropharyngeal exudate or posterior oropharyngeal erythema.  Neck:     Musculoskeletal: Neck supple. No muscular tenderness.     Thyroid: No thyromegaly.  Cardiovascular:     Rate and Rhythm: Normal rate and regular rhythm.  Pulmonary:     Effort: No respiratory distress.     Breath sounds: Normal breath sounds. No wheezing.  Abdominal:     General: Bowel sounds are normal.     Palpations: Abdomen is soft.     Tenderness: There is no abdominal tenderness.  Musculoskeletal:        General: No swelling or tenderness.  Lymphadenopathy:     Cervical: No cervical adenopathy.  Skin:    Findings: No erythema or rash.  Neurological:     Mental Status: She is alert.  Psychiatric:        Mood and Affect: Mood normal.        Behavior: Behavior normal.     BP 110/78   Pulse 67   Temp 98.1 F (36.7 C) (Oral)   Wt 211 lb 12.8 oz (96.1 kg)   SpO2 97%   BMI 40.02 kg/m  Wt Readings from  Last 3 Encounters:  08/02/18 211 lb 12.8 oz (96.1 kg)  05/02/18 216 lb (98 kg)  04/06/18 214 lb 4 oz (97.2 kg)     Lab Results  Component Value Date   WBC 7.2 12/12/2017   HGB 13.1 12/12/2017   HCT 38.7 12/12/2017   PLT 265.0 12/12/2017   GLUCOSE 116 (H) 08/02/2018  CHOL 228 (H) 08/02/2018   TRIG 210.0 (H) 08/02/2018   HDL 44.70 08/02/2018   LDLDIRECT 153.0 08/02/2018   LDLCALC 141 (H) 08/15/2013   ALT 16 08/02/2018   AST 13 08/02/2018   NA 139 08/02/2018   K 3.8 08/02/2018   CL 98 08/02/2018   CREATININE 0.70 08/02/2018   BUN 16 08/02/2018   CO2 31 08/02/2018   TSH 3.75 12/12/2017   HGBA1C 5.9 08/02/2018   MICROALBUR <0.7 12/12/2017    US Breast Ltd Uni Right Inc Axilla  Result Date: 04/10/2018 CLINICAL DATA:  63 year old female recalled from screening mammogram dated 03/29/2018 for a right breast mass. EXAM: DIGITAL DIAGNOSTIC RIGHT MAMMOGRAM WITH CAD AND TOMO ULTRASOUND RIGHT BREAST COMPARISON:  Previous exam(s). ACR Breast Density Category b: There are scattered areas of fibroglandular density. FINDINGS: A circumscribed, gently lobulated mass persists in the central right breast at middle depth. Further evaluation with ultrasound was performed. Mammographic images were processed with CAD. Targeted ultrasound is performed, showing no focal findings corresponding to the mammographically identified mass. Extensive evaluation of the entire central right breast was performed. Evaluation of the right axilla demonstrates no suspicious lymphadenopathy. IMPRESSION: 1. Indeterminate right breast mass corresponding with the screening mammographic findings and without ultrasound correlate. 2. No suspicious right axillary lymphadenopathy. RECOMMENDATION: Stereotactic biopsy of the indeterminate right breast mass. I have discussed the findings and recommendations with the patient. Results were also provided in writing at the conclusion of the visit. If applicable, a reminder letter will be  sent to the patient regarding the next appointment. BI-RADS CATEGORY  4: Suspicious. Electronically Signed   By: Kristopher Oppenheim M.D.   On: 04/10/2018 14:49   Mm Diag Breast Tomo Uni Right  Result Date: 04/10/2018 CLINICAL DATA:  63 year old female recalled from screening mammogram dated 03/29/2018 for a right breast mass. EXAM: DIGITAL DIAGNOSTIC RIGHT MAMMOGRAM WITH CAD AND TOMO ULTRASOUND RIGHT BREAST COMPARISON:  Previous exam(s). ACR Breast Density Category b: There are scattered areas of fibroglandular density. FINDINGS: A circumscribed, gently lobulated mass persists in the central right breast at middle depth. Further evaluation with ultrasound was performed. Mammographic images were processed with CAD. Targeted ultrasound is performed, showing no focal findings corresponding to the mammographically identified mass. Extensive evaluation of the entire central right breast was performed. Evaluation of the right axilla demonstrates no suspicious lymphadenopathy. IMPRESSION: 1. Indeterminate right breast mass corresponding with the screening mammographic findings and without ultrasound correlate. 2. No suspicious right axillary lymphadenopathy. RECOMMENDATION: Stereotactic biopsy of the indeterminate right breast mass. I have discussed the findings and recommendations with the patient. Results were also provided in writing at the conclusion of the visit. If applicable, a reminder letter will be sent to the patient regarding the next appointment. BI-RADS CATEGORY  4: Suspicious. Electronically Signed   By: Kristopher Oppenheim M.D.   On: 04/10/2018 14:49       Assessment & Plan:   Problem List Items Addressed This Visit    Abnormal mammogram of right breast    Saw Dr Bary Castilla.  Elected to follow.  Per note, planning for diagnostic mammogram right breast 09/2018.        Essential hypertension    Blood pressure under good control.  Continue same medication regimen.  Follow pressures.  Follow metabolic  panel.        Relevant Medications   hydrochlorothiazide (MICROZIDE) 12.5 MG capsule   nebivolol (BYSTOLIC) 2.5 MG tablet   Other Relevant Orders   Ambulatory referral to Cardiology  Hernia of abdominal wall    Saw Dr Bary Castilla.  Planning for surgery in the near future.  No date set.        Hypercholesterolemia    Has declined cholesterol medication.  Low cholesterol diet and exercise.  Follow lipid panel.        Relevant Medications   hydrochlorothiazide (MICROZIDE) 12.5 MG capsule   nebivolol (BYSTOLIC) 2.5 MG tablet   Other Relevant Orders   Ambulatory referral to Cardiology   Hyperglycemia    Low carb diet and exercise.  Follow met b and a1c.       Severe obesity (BMI >= 40) (HCC)    Discussed diet and exercise.  Follow.        Sleep apnea    Has a history of sleep apnea.  Have discussed risk of untreated sleep apnea.  She declines further testing.  Follow.        SOB (shortness of breath) on exertion    Does report sob with exertion.  Does improved some with increased activity.  With murmur, sob with inclines/stairs and upcoming surgery, do feel she warrants further cardiac evaluation.  Pt agrees.  Refer to cardiology.        Relevant Orders   Ambulatory referral to Cardiology   Stress    Doing well on zoloft.  Follow.            Einar Pheasant, MD

## 2018-08-05 ENCOUNTER — Encounter: Payer: Self-pay | Admitting: Internal Medicine

## 2018-08-05 NOTE — Assessment & Plan Note (Signed)
Does report sob with exertion.  Does improved some with increased activity.  With murmur, sob with inclines/stairs and upcoming surgery, do feel she warrants further cardiac evaluation.  Pt agrees.  Refer to cardiology.

## 2018-08-05 NOTE — Assessment & Plan Note (Signed)
Low carb diet and exercise.  Follow met b and a1c.

## 2018-08-05 NOTE — Assessment & Plan Note (Signed)
Blood pressure under good control.  Continue same medication regimen.  Follow pressures.  Follow metabolic panel.   

## 2018-08-05 NOTE — Assessment & Plan Note (Signed)
Saw Dr Bary Castilla.  Planning for surgery in the near future.  No date set.

## 2018-08-05 NOTE — Assessment & Plan Note (Signed)
Doing well on zoloft.  Follow.   

## 2018-08-05 NOTE — Assessment & Plan Note (Signed)
Has a history of sleep apnea.  Have discussed risk of untreated sleep apnea.  She declines further testing.  Follow.

## 2018-08-05 NOTE — Assessment & Plan Note (Signed)
Has declined cholesterol medication.  Low cholesterol diet and exercise.  Follow lipid panel.   

## 2018-08-05 NOTE — Assessment & Plan Note (Signed)
Saw Dr Bary Castilla.  Elected to follow.  Per note, planning for diagnostic mammogram right breast 09/2018.

## 2018-08-05 NOTE — Assessment & Plan Note (Signed)
Discussed diet and exercise.  Follow.  

## 2018-08-15 ENCOUNTER — Other Ambulatory Visit: Payer: Self-pay

## 2018-08-15 DIAGNOSIS — R928 Other abnormal and inconclusive findings on diagnostic imaging of breast: Secondary | ICD-10-CM

## 2018-09-19 ENCOUNTER — Other Ambulatory Visit: Payer: 59

## 2018-09-20 ENCOUNTER — Other Ambulatory Visit: Payer: 59

## 2018-09-28 ENCOUNTER — Ambulatory Visit: Payer: 59 | Admitting: General Surgery

## 2018-10-17 ENCOUNTER — Other Ambulatory Visit: Payer: 59

## 2018-10-18 ENCOUNTER — Ambulatory Visit: Payer: 59 | Admitting: Internal Medicine

## 2018-10-18 ENCOUNTER — Other Ambulatory Visit: Payer: 59

## 2018-10-31 ENCOUNTER — Telehealth: Payer: Self-pay | Admitting: Internal Medicine

## 2018-10-31 NOTE — Telephone Encounter (Signed)
Thank you Margaret Mercado are correct, only in-patient procedures need COVID testing, not echos/ stress tests!

## 2018-10-31 NOTE — Telephone Encounter (Signed)
Patient calling back Patient believes she was mistaken on what message was saying - explained to her again I did not believe testing was needed for [facility] Does not need a call from nurse  Patient will keep appointments

## 2018-10-31 NOTE — Telephone Encounter (Signed)
Patient calling States that she received a mychart message that she has to be tested for COVID19 to have her ECHO Patient does not want to get tested and would like to know if it is a requirement If so she would like to cancel her appointments Please call to discuss

## 2018-11-03 ENCOUNTER — Telehealth: Payer: Self-pay | Admitting: Internal Medicine

## 2018-11-03 NOTE — Telephone Encounter (Signed)
Called patient and went over instructions for office visit and she verbalized understanding.       COVID-19 Pre-Screening Questions:  . In the past 7 to 10 days have you had a cough,  shortness of breath, headache, congestion, fever (100 or greater) body aches, chills, sore throat, or sudden loss of taste or sense of smell?   NO . Have you been around anyone with known Covid 19.  NO . Have you been around anyone who is awaiting Covid 19 test results in the past 7 to 10 days?   NO . Have you been around anyone who has been exposed to Covid 19, or has mentioned symptoms of Covid 19 within the past 7 to 10 days?   NO

## 2018-11-03 NOTE — Telephone Encounter (Signed)
Patient is returning your call, ok to leave a detailed message

## 2018-11-07 NOTE — Progress Notes (Signed)
New Outpatient Visit Date: 11/08/2018  Referring Provider: Einar Pheasant, [address] [place], [place] [id number]  Chief Complaint: Heart murmur and dyspnea on exertion  HPI:  Margaret Mercado is a 63 y.o. female who is being seen today for the evaluation of shortness of breath and heart murmur at the request of Dr. Nicki Reaper. She has a history of hypertension, hyperlipidemia, obstructive sleep apnea, morbid obesity, and TIAs.  She was seen in 2010 in our practice by Dr. Aundra Dubin but has been lost to follow-up since then.  She was seen in February by Dr. Nicki Reaper, at which time she reported exertional dyspnea.  She is also planning to undergo abdominal wall hernia repair at some point, though the procedure is on hold in the setting of COVID-19 pandemic.  Margaret Mercado reports that she was first told about a heart murmur during evaluation by her abdominal surgeon.  Murmur was also subsequently noted by Dr. Nicki Reaper.  Over the last year, Margaret Mercado has noticed more pronounced exertional dyspnea, especially when she first gets up to exercise or climb stairs after having sat still for an extended period of time.  She attributes some of her dyspnea to deconditioning and weight gain.  If she gradually increases her activity, she is able to walk briskly and even jog without any significant shortness of breath.  She has occasional leg edema for which she uses HCTZ intermittently.  She does not take it daily due to leg cramps.  She has not had any chest pain, palpitations, or lightheadedness.  Margaret Mercado has been monitoring her heart rate and blood pressure at home, noting that her blood pressure has been generally well controlled.  Her resting heart rate sometimes dips to the 50s, though she is asymptomatic.  She therefore, has been cutting her nebivolol tablet into quarters, taking 0.625 mg daily.  Margaret Mercado reports having gained up to 25 pounds over the last year due to her job, in which she  sits most of the day.  She has been able to lose 10 to 15 pounds from her peak, however.  Margaret Mercado has not had any new neurologic symptoms since her remote TIA.  At that time, she had word finding difficulty.  She is not currently on a statin out of concern for side effects.  She is trying to control her cholesterol through lifestyle modifications as well as red yeast rice.  --------------------------------------------------------------------------------------------------  Cardiovascular History & Procedures: Cardiovascular Problems:  TIA  Heart murmur  Dyspnea on exertion  Risk Factors:  TIA, hypertension, hyperlipidemia, and morbid obesity  Cath/PCI:  None  CV Surgery:  None  EP Procedures and Devices:  None  Non-Invasive Evaluation(s):  Echocardiogram (01/2009): LVEF greater than 55% with mild LVH.  Mild aortic regurgitation.  Normal RV size and function.  Recent CV Pertinent Labs: Lab Results  Component Value Date   CHOL 228 (H) 08/02/2018   HDL 44.70 08/02/2018   LDLCALC 141 (H) 08/15/2013   LDLDIRECT 153.0 08/02/2018   TRIG 210.0 (H) 08/02/2018   CHOLHDL 5 08/02/2018   K 3.8 08/02/2018   BUN 16 08/02/2018   CREATININE 0.70 08/02/2018    --------------------------------------------------------------------------------------------------  Past Medical History:  Diagnosis Date  . Abnormal echocardiogram 01/2009   Mild LVH, EF >55%, no regional wall motion abnormalities, normal RV, mild aortic insufficiency, no aortic stenosis.  . Anxiety   . Heart murmur   . Hyperlipidemia   . Hypertension    Edema with Norvasc. Unable to  tolerate Lisinopril/ HCTZ.  Marland Kitchen Hypokalemia    Manifested by lip and finger tingling  . Palpitations    Event monitor 01/2009 with no significant arrhythmias  . TIA (transient ischemic attack) 01/2009   CT and MRI of head showed no evidence for stroke. Carotid dopplers showed no significant plaque    Past Surgical History:   Procedure Laterality Date  . CRYOABLATION     Of uterus  . UMBILICAL HERNIA REPAIR    . URETHRAL DILATION     removal of bladder polyps    Current Meds  Medication Sig  . aspirin 81 MG EC tablet Take 81 mg by mouth daily.    . fish oil-omega-3 fatty acids 1000 MG capsule Take 0.75 capsules by mouth daily.    . hydrochlorothiazide (MICROZIDE) 12.5 MG capsule Take 1 capsule (12.5 mg total) by mouth daily. (Patient taking differently: Take 12.5 mg by mouth as needed. )  . nebivolol (BYSTOLIC) 2.5 MG tablet Take 0.5 tablets (1.25 mg total) by mouth daily.  . sertraline (ZOLOFT) 25 MG tablet One tablet bid (Patient taking differently: as needed. )    Allergies: Vicodin [hydrocodone-acetaminophen]; Amlodipine besylate; Cardizem [diltiazem hcl]; Erythromycin; Hydrocodone-acetaminophen; Levaquin [levofloxacin in d5w]; Lisinopril; Macrobid [nitrofurantoin macrocrystal]; Oxycodone-acetaminophen; Penicillins; and Sulfonamide derivatives  Social History   Tobacco Use  . Smoking status: Never Smoker  . Smokeless tobacco: Never Used  Substance Use Topics  . Alcohol use: No    Alcohol/week: 0.0 standard drinks  . Drug use: No    Family History  Problem Relation Age of Onset  . Hypertension Mother   . Arrhythmia Mother        Atrial fibrillation  . Hypertension Father   . Hypertension Brother   . Hypertension Paternal Grandmother   . Coronary artery disease Neg Hx        Premature  . Breast cancer Neg Hx   . Colon cancer Neg Hx     Review of Systems: A 12-system review of systems was performed and was negative except as noted in the HPI.  --------------------------------------------------------------------------------------------------  Physical Exam: BP 118/64 (BP Location: Left Arm, Patient Position: Sitting, Cuff Size: Normal)   Pulse 67   Ht 5\' 1"  (1.549 m)   Wt 213 lb (96.6 kg)   BMI 40.25 kg/m   General: NAD. HEENT: No conjunctival pallor or scleral icterus.  Surgical  mask covers nose and mouth. Neck: Supple without lymphadenopathy, thyromegaly, JVD, or HJR. Lungs: Normal work of breathing. Clear to auscultation bilaterally without wheezes or crackles. Heart: Regular rate and rhythm with 1/6 systolic murmur loudest at the right upper sternal border.  No rubs or gallops.  Unable to assess PMI due to body habitus. Abd: Bowel sounds present. Soft, NT/ND.  Ventral hernia noted.  Unable to assess HSM due to body habitus. Ext: No lower extremity edema. Radial, PT, and DP pulses are 2+ bilaterally Skin: Warm and dry without rash. Neuro: CNIII-XII intact. Strength and fine-touch sensation intact in upper and lower extremities bilaterally. Psych: Normal mood and affect.  EKG: Normal sinus rhythm without abnormality.  Lab Results  Component Value Date   WBC 7.2 12/12/2017   HGB 13.1 12/12/2017   HCT 38.7 12/12/2017   MCV 95.9 12/12/2017   PLT 265.0 12/12/2017    Lab Results  Component Value Date   NA 139 08/02/2018   K 3.8 08/02/2018   CL 98 08/02/2018   CO2 31 08/02/2018   BUN 16 08/02/2018   CREATININE 0.70 08/02/2018  GLUCOSE 116 (H) 08/02/2018   ALT 16 08/02/2018    Lab Results  Component Value Date   CHOL 228 (H) 08/02/2018   HDL 44.70 08/02/2018   LDLCALC 141 (H) 08/15/2013   LDLDIRECT 153.0 08/02/2018   TRIG 210.0 (H) 08/02/2018   CHOLHDL 5 08/02/2018     --------------------------------------------------------------------------------------------------  ASSESSMENT AND PLAN: Dyspnea on exertion and heart murmur: I suspect that Margaret Mercado's exertional dyspnea is a combination of deconditioning and weight gain.  A very soft, likely innocent, systolic murmur is appreciated on exam today.  There are no signs of heart failure on exam today.  EKG is also normal.  We have agreed to obtain a transthoracic echocardiogram for further assessment.  I think it is reasonable to defer additional testing if this does not reveal any significant  structural abnormalities.  Hypertension: Blood pressure very well controlled today.  Given reported resting bradycardia on very low-dose nebivolol, I have recommended stopping this medication.  Ideally, Margaret Mercado would use HCTZ on a daily basis, though I will defer dosing frequency to her and Dr. Nicki Reaper.  Hyperlipidemia and history of TIA: Most recent lipid panel in 07/2018 was notable for total cholesterol 228, triglycerides of 210, and direct LDL of 153.  Given history of TIA, I have recommended initiation of statin therapy for target LDL less than 70.  Margaret Mercado is reluctant to do this out of concern for side effects.  We have discussed the risks and benefits of statin therapy.  We will defer adding a statin at this time, though it should be readdressed when Margaret Mercado follows up with Dr. Nicki Reaper.  She should continue low-dose aspirin indefinitely.  Follow-up: Return to clinic in 3 months.  Nelva Bush, MD 11/08/2018 2:49 PM

## 2018-11-08 ENCOUNTER — Other Ambulatory Visit: Payer: Self-pay

## 2018-11-08 ENCOUNTER — Other Ambulatory Visit: Payer: 59

## 2018-11-08 ENCOUNTER — Encounter: Payer: Self-pay | Admitting: Internal Medicine

## 2018-11-08 ENCOUNTER — Ambulatory Visit (INDEPENDENT_AMBULATORY_CARE_PROVIDER_SITE_OTHER): Payer: 59 | Admitting: Internal Medicine

## 2018-11-08 VITALS — BP 118/64 | HR 67 | Ht 61.0 in | Wt 213.0 lb

## 2018-11-08 DIAGNOSIS — E785 Hyperlipidemia, unspecified: Secondary | ICD-10-CM | POA: Diagnosis not present

## 2018-11-08 DIAGNOSIS — R011 Cardiac murmur, unspecified: Secondary | ICD-10-CM | POA: Diagnosis not present

## 2018-11-08 DIAGNOSIS — I10 Essential (primary) hypertension: Secondary | ICD-10-CM | POA: Diagnosis not present

## 2018-11-08 DIAGNOSIS — Z8673 Personal history of transient ischemic attack (TIA), and cerebral infarction without residual deficits: Secondary | ICD-10-CM

## 2018-11-08 DIAGNOSIS — R0609 Other forms of dyspnea: Secondary | ICD-10-CM

## 2018-11-08 NOTE — Patient Instructions (Signed)
Medication Instructions:  Your physician has recommended you make the following change in your medication:  1- STOP Bystolic.  Please let us know if your blood pressure consistently runs greater than 140/90.   If you need a refill on your cardiac medications before your next appointment, please call your pharmacy.   Lab work: none If you have labs (blood work) drawn today and your tests are completely normal, you will receive your results only by: Marland Kitchen MyChart Message (if you have MyChart) OR . A paper copy in the mail If you have any lab test that is abnormal or we need to change your treatment, we will call you to review the results.  Testing/Procedures: Your physician has requested that you have an echocardiogram. Echocardiography is a painless test that uses sound waves to create images of your heart. It provides your doctor with information about the size and shape of your heart and how well your heart's chambers and valves are working. This procedure takes approximately one hour. There are no restrictions for this procedure. You may get an IV, if needed, to receive an ultrasound enhancing agent through to better visualize your heart.     Follow-Up: At [facility], you and your health needs are our priority.  As part of our continuing mission to provide you with exceptional heart care, we have created designated Provider Care Teams.  These Care Teams include your primary Cardiologist (physician) and Advanced Practice Providers (APPs -  Physician Assistants and Nurse Practitioners) who all work together to provide you with the care you need, when you need it. You will need a follow up appointment in 3 months.  Please call our office 2 months in advance to schedule this appointment.  You may see DR Harrell Gave END or one of the following Advanced Practice Providers on your designated Care Team:   Murray Hodgkins, NP Christell Faith, PA-C . Marrianne Mood, PA-C     Echocardiogram An  echocardiogram is a procedure that uses painless sound waves (ultrasound) to produce an image of the heart. Images from an echocardiogram can provide important information about:  Signs of coronary artery disease (CAD).  Aneurysm detection. An aneurysm is a weak or damaged part of an artery wall that bulges out from the normal force of blood pumping through the body.  Heart size and shape. Changes in the size or shape of the heart can be associated with certain conditions, including heart failure, aneurysm, and CAD.  Heart muscle function.  Heart valve function.  Signs of a past heart attack.  Fluid buildup around the heart.  Thickening of the heart muscle.  A tumor or infectious growth around the heart valves. Tell a health care provider about:  Any allergies you have.  All medicines you are taking, including vitamins, herbs, eye drops, creams, and over-the-counter medicines.  Any blood disorders you have.  Any surgeries you have had.  Any medical conditions you have.  Whether you are pregnant or may be pregnant. What are the risks? Generally, this is a safe procedure. However, problems may occur, including:  Allergic reaction to dye (contrast) that may be used during the procedure. What happens before the procedure? No specific preparation is needed. You may eat and drink normally. What happens during the procedure?   An IV tube may be inserted into one of your veins.  You may receive contrast through this tube. A contrast is an injection that improves the quality of the pictures from your heart.  A gel  will be applied to your chest.  A wand-like tool (transducer) will be moved over your chest. The gel will help to transmit the sound waves from the transducer.  The sound waves will harmlessly bounce off of your heart to allow the heart images to be captured in real-time motion. The images will be recorded on a computer. The procedure may vary among health care  providers and hospitals. What happens after the procedure?  You may return to your normal, everyday life, including diet, activities, and medicines, unless your health care provider tells you not to do that. Summary  An echocardiogram is a procedure that uses painless sound waves (ultrasound) to produce an image of the heart.  Images from an echocardiogram can provide important information about the size and shape of your heart, heart muscle function, heart valve function, and fluid buildup around your heart.  You do not need to do anything to prepare before this procedure. You may eat and drink normally.  After the echocardiogram is completed, you may return to your normal, everyday life, unless your health care provider tells you not to do that. This information is not intended to replace advice given to you by your health care provider. Make sure you discuss any questions you have with your health care provider. Document Released: 05/28/2000 Document Revised: 07/03/2016 Document Reviewed: 07/03/2016 Elsevier Interactive Patient Education  2019 [employer].

## 2018-11-10 ENCOUNTER — Other Ambulatory Visit: Payer: 59

## 2018-11-10 ENCOUNTER — Ambulatory Visit: Payer: 59 | Admitting: Internal Medicine

## 2018-11-14 ENCOUNTER — Other Ambulatory Visit: Payer: Self-pay | Admitting: Internal Medicine

## 2018-11-21 ENCOUNTER — Ambulatory Visit: Payer: 59 | Admitting: Internal Medicine

## 2018-11-24 ENCOUNTER — Ambulatory Visit
Admission: RE | Admit: 2018-11-24 | Discharge: 2018-11-24 | Disposition: A | Discharge status: Home / Self Care | Payer: 59 | Source: Ambulatory Visit | Attending: General Surgery | Admitting: General Surgery

## 2018-11-24 ENCOUNTER — Other Ambulatory Visit: Payer: Self-pay

## 2018-11-24 DIAGNOSIS — R928 Other abnormal and inconclusive findings on diagnostic imaging of breast: Secondary | ICD-10-CM | POA: Diagnosis not present

## 2018-11-28 ENCOUNTER — Ambulatory Visit (INDEPENDENT_AMBULATORY_CARE_PROVIDER_SITE_OTHER): Payer: 59 | Admitting: General Surgery

## 2018-11-28 ENCOUNTER — Other Ambulatory Visit: Payer: Self-pay

## 2018-11-28 ENCOUNTER — Encounter: Payer: Self-pay | Admitting: General Surgery

## 2018-11-28 VITALS — BP 144/75 | HR 65 | Temp 97.3°F | Resp 16 | Ht 61.0 in | Wt 215.0 lb

## 2018-11-28 DIAGNOSIS — K439 Ventral hernia without obstruction or gangrene: Secondary | ICD-10-CM

## 2018-11-28 DIAGNOSIS — R928 Other abnormal and inconclusive findings on diagnostic imaging of breast: Secondary | ICD-10-CM | POA: Diagnosis not present

## 2018-11-28 NOTE — Patient Instructions (Addendum)
The patient is aware to call back for any questions or new concerns. Follow up as needed regarding the breast and call when she is ready to have her hernia repair.  Hernia, Adult     A hernia is the bulging of an organ or tissue through a weak spot in the muscles of the abdomen (abdominal wall). Hernias develop most often near the belly button (navel) or the area where the leg meets the lower abdomen (groin). Common types of hernias include:  Incisional hernia. This type bulges through a scar from an abdominal surgery.  Umbilical hernia. This type develops near the navel.  Inguinal hernia. This type develops in the groin or scrotum.  Femoral hernia. This type develops under the groin, in the upper thigh area.  Hiatal hernia. This type occurs when part of the stomach slides above the muscle that separates the abdomen from the chest (diaphragm). What are the causes? This condition may be caused by:  Heavy lifting.  Coughing over a long period of time.  Straining to have a bowel movement. Constipation can lead to straining.  An incision made during an abdominal surgery.  A physical problem that is present at birth (congenital defect).  Being overweight or obese.  Smoking.  Excess fluid in the abdomen.  Undescended testicles in males. What are the signs or symptoms? The main symptom is a skin-colored, rounded bulge in the area of the hernia. However, a bulge may not always be present. It may grow bigger or be more visible when you cough or strain (such as when lifting something heavy). A hernia that can be pushed back into the area (is reducible) rarely causes pain. A hernia that cannot be pushed back into the area (is incarcerated) may lose its blood supply (become strangulated). A hernia that is incarcerated may cause:  Pain.  Fever.  Nausea and vomiting.  Swelling.  Constipation. How is this diagnosed? A hernia may be diagnosed based on:  Your symptoms and  medical history.  A physical exam. Your health care provider may ask you to cough or move in certain ways to see if the hernia becomes visible.  Imaging tests, such as: ? X-rays. ? Ultrasound. ? CT scan. How is this treated? A hernia that is small and painless may not need to be treated. A hernia that is large or painful may be treated with surgery. Inguinal hernias may be treated with surgery to prevent incarceration or strangulation. Strangulated hernias are always treated with surgery because a lack of blood supply to the trapped organ or tissue can cause it to die. Surgery to treat a hernia involves pushing the bulge back into place and repairing the weak area of the muscle or abdominal wall. Follow these instructions at home: Activity  Avoid straining.  Do not lift anything that is heavier than 10 lb (4.5 kg), or the limit that you are told, until your health care provider says that it is safe.  When lifting heavy objects, lift with your leg muscles, not your back muscles. Preventing constipation  Take actions to prevent constipation. Constipation leads to straining with bowel movements, which can make a hernia worse or cause a hernia repair to break down. Your health care provider may recommend that you: ? Drink enough fluid to keep your urine pale yellow. ? Eat foods that are high in fiber, such as fresh fruits and vegetables, whole grains, and beans. ? Limit foods that are high in fat and processed sugars, such as fried  or sweet foods. ? Take an over-the-counter or prescription medicine for constipation. General instructions  When coughing, try to cough gently.  You may try to push the hernia back in place by very gently pressing on it while lying down. Do not try to force the bulge back in if it will not push in easily.  If you are overweight, work with your health care provider to lose weight safely.  Do not use any products that contain nicotine or tobacco, such as  cigarettes and e-cigarettes. If you need help quitting, ask your health care provider.  If you are scheduled for hernia repair, watch your hernia for any changes in shape, size, or color. Tell your health care provider about any changes or new symptoms.  Take over-the-counter and prescription medicines only as told by your health care provider.  Keep all follow-up visits as told by your health care provider. This is important. Contact a health care provider if:  You develop new pain, swelling, or redness around your hernia.  You have signs of constipation, such as: ? Fewer bowel movements in a week than normal. ? Difficulty having a bowel movement. ? Stools that are dry, hard, or larger than normal. Get help right away if:  You have a fever.  You have abdomen pain that gets worse.  You feel nauseous or you vomit.  You cannot push the hernia back in place by very gently pressing on it while lying down. Do not try to force the bulge back in if it will not push in easily.  The hernia: ? Changes in shape, size, or color. ? Feels hard or tender. These symptoms may represent a serious problem that is an emergency. Do not wait to see if the symptoms will go away. Get medical help right away. Call your local emergency services (911 in the [place] Summary  A hernia is the bulging of an organ or tissue through a weak spot in the muscles of the abdomen (abdominal wall).  The main symptom is a skin-colored, rounded lump (bulge) in the hernia area. However, a bulge may not always be present. It may grow bigger or more visible when you cough or strain (such as when having a bowel movement).  A hernia that is small and painless may not need to be treated. A hernia that is large or painful may be treated with surgery.  Surgery to treat a hernia involves pushing the bulge back into place and repairing the weak part of the abdomen. This information is not intended to replace advice given to you by  your health care provider. Make sure you discuss any questions you have with your health care provider. Document Released: 05/31/2005 Document Revised: 03/02/2017 Document Reviewed: 03/02/2017 Elsevier Interactive Patient Education  2019 [employer].

## 2018-11-28 NOTE — Progress Notes (Signed)
Patient ID: Margaret Mercado, female   DOB: 1956/03/25, 63 y.o.   MRN: [id number]  Chief Complaint  Patient presents with  . Follow-up    f/u rec Earlie Raveling [facility] 11/24/18    HPI Margaret Mercado is a 63 y.o. female.  Here for her follow up mammogram and breast exam. Mammogram was 11-24-18. She states the right breast mas is no longer there. No new breast issues. She does want her hernia repaired. She states she has had this repaired in the past. She states she noticed the return of a knot about 2-3 years ago.  HPI  Past Medical History:  Diagnosis Date  . Abnormal echocardiogram 01/2009   Mild LVH, EF >55%, no regional wall motion abnormalities, normal RV, mild aortic insufficiency, no aortic stenosis.  . Anxiety   . Heart murmur   . Hyperlipidemia   . Hypertension    Edema with Norvasc. Unable to tolerate Lisinopril/ HCTZ.  Marland Kitchen Hypokalemia    Manifested by lip and finger tingling  . Palpitations    Event monitor 01/2009 with no significant arrhythmias  . TIA (transient ischemic attack) 01/2009   CT and MRI of head showed no evidence for stroke. Carotid dopplers showed no significant plaque    Past Surgical History:  Procedure Laterality Date  . CRYOABLATION     Of uterus  . UMBILICAL HERNIA REPAIR  2010   Dr Nicholes Stairs  . URETHRAL DILATION     removal of bladder polyps    Family History  Problem Relation Age of Onset  . Hypertension Mother   . Arrhythmia Mother        Atrial fibrillation  . Hypertension Father   . Valvular heart disease Father   . Hypertension Brother   . Hypertension Paternal Grandmother   . Coronary artery disease Neg Hx        Premature  . Breast cancer Neg Hx   . Colon cancer Neg Hx     Social History Social History   Tobacco Use  . Smoking status: Never Smoker  . Smokeless tobacco: Never Used  Substance Use Topics  . Alcohol use: No    Alcohol/week: 0.0 standard drinks  . Drug use: No    Allergies  Allergen Reactions  . Vicodin  [Hydrocodone-Acetaminophen] Anaphylaxis  . Amlodipine Besylate   . Cardizem [Diltiazem Hcl] Other (See Comments)    Flushing   . Erythromycin Other (See Comments)    Chest heaviness   . Hydrocodone-Acetaminophen   . Levaquin [Levofloxacin In D5w] Other (See Comments)    Rapid heart beat  . Lisinopril Other (See Comments)    Difficulty breathing and rash  . Macrobid [Nitrofurantoin Macrocrystal] Other (See Comments)    Wheezing and headache  . Oxycodone-Acetaminophen   . Penicillins Rash  . Sulfonamide Derivatives Rash    Current Outpatient Medications  Medication Sig Dispense Refill  . aspirin 81 MG EC tablet Take 81 mg by mouth daily.      . fish oil-omega-3 fatty acids 1000 MG capsule Take 0.75 capsules by mouth daily.      . hydrochlorothiazide (MICROZIDE) 12.5 MG capsule Take 1 capsule (12.5 mg total) by mouth daily. (Patient taking differently: Take 12.5 mg by mouth as needed. ) 30 capsule 5  . Red Yeast Rice Extract (RED YEAST RICE PO) Take by mouth daily.    . sertraline (ZOLOFT) 25 MG tablet TAKE 1 TABLET BY MOUTH TWICE DAILY 60 tablet 2   No current facility-administered  medications for this visit.     Review of Systems Review of Systems  Constitutional: Negative.   Respiratory: Negative.   Cardiovascular: Negative.     Blood pressure (!) 144/75, pulse 65, temperature (!) 97.3 F (36.3 C), temperature source Temporal, resp. rate 16, height 5\' 1"  (1.549 m), weight 215 lb (97.5 kg), SpO2 97 %.  Physical Exam Physical Exam Exam conducted with a chaperone present.  Constitutional:      Appearance: She is well-developed.  Eyes:     General: No scleral icterus.    Conjunctiva/sclera: Conjunctivae normal.  Neck:     Musculoskeletal: Neck supple.  Cardiovascular:     Rate and Rhythm: Normal rate and regular rhythm.     Heart sounds: Murmur present. Systolic murmur present with a grade of 2/6.  Pulmonary:     Effort: Pulmonary effort is normal.     Breath  sounds: Normal breath sounds.  Chest:     Breasts:        Right: No inverted nipple, mass, nipple discharge, skin change or tenderness.        Left: No inverted nipple, mass, nipple discharge, skin change or tenderness.  Abdominal:     Hernia: A hernia is present. Hernia is present in the ventral area.       Comments: Epigastric ventral hernia  Musculoskeletal:     Right lower leg: No edema.     Left lower leg: No edema.  Lymphadenopathy:     Cervical: No cervical adenopathy.  Skin:    General: Skin is warm and dry.  Neurological:     Mental Status: She is alert and oriented to person, place, and time.  Psychiatric:        Behavior: Behavior normal.     Data Reviewed Bilateral diagnostic mammograms dated December 02, 2018 were independently reviewed.  The previously identified mass on the April 10, 2018 study is no longer evident.  BI-RADS-2.  Assessment Resolution of mammographically identified breast abnormality.    Increasing symptomatic ventral hernia.  Plan  Resume annual screening mammograms with her primary care provider. The patient is aware to call back for any questions or new concerns.  The patient will contact the office when she feels the present viral pandemic has quieted to the point where she is interested in having her ventral hernia repaired.  In the interval, will attempt to obtain the operative report.  HPI, assessment, plan and physical exam has been scribed under the direction and in the presence of Robert Bellow, MD. Margaret Fetch, RN  I have completed the exam and reviewed the above documentation for accuracy and completeness.  I agree with the above.  Haematologist has been used and any errors in dictation or transcription are unintentional.  Hervey Ard, M.D., F.A.C.S.  Margaret Mercado 11/29/2018, 2:27 PM

## 2018-12-06 ENCOUNTER — Other Ambulatory Visit: Payer: 59

## 2018-12-11 ENCOUNTER — Telehealth: Payer: Self-pay

## 2018-12-11 NOTE — Telephone Encounter (Signed)
Error

## 2019-01-03 ENCOUNTER — Ambulatory Visit: Payer: 59 | Admitting: Internal Medicine

## 2019-02-09 ENCOUNTER — Ambulatory Visit (INDEPENDENT_AMBULATORY_CARE_PROVIDER_SITE_OTHER): Payer: 59

## 2019-02-09 ENCOUNTER — Other Ambulatory Visit: Payer: Self-pay

## 2019-02-09 DIAGNOSIS — R0609 Other forms of dyspnea: Secondary | ICD-10-CM

## 2019-02-09 DIAGNOSIS — R011 Cardiac murmur, unspecified: Secondary | ICD-10-CM

## 2019-02-15 ENCOUNTER — Encounter: Payer: Self-pay | Admitting: Internal Medicine

## 2019-02-15 ENCOUNTER — Ambulatory Visit (INDEPENDENT_AMBULATORY_CARE_PROVIDER_SITE_OTHER): Payer: 59 | Admitting: Internal Medicine

## 2019-02-15 ENCOUNTER — Other Ambulatory Visit: Payer: Self-pay

## 2019-02-15 DIAGNOSIS — R0609 Other forms of dyspnea: Secondary | ICD-10-CM

## 2019-02-15 DIAGNOSIS — R739 Hyperglycemia, unspecified: Secondary | ICD-10-CM | POA: Diagnosis not present

## 2019-02-15 DIAGNOSIS — E785 Hyperlipidemia, unspecified: Secondary | ICD-10-CM

## 2019-02-15 DIAGNOSIS — I10 Essential (primary) hypertension: Secondary | ICD-10-CM | POA: Diagnosis not present

## 2019-02-15 DIAGNOSIS — R928 Other abnormal and inconclusive findings on diagnostic imaging of breast: Secondary | ICD-10-CM

## 2019-02-15 DIAGNOSIS — K439 Ventral hernia without obstruction or gangrene: Secondary | ICD-10-CM

## 2019-02-15 NOTE — Progress Notes (Signed)
Patient ID: Margaret Mercado, female   DOB: Sep 20, 1955, 63 y.o.   MRN: [id number]   Virtual Visit via video Note  This visit type was conducted due to national recommendations for restrictions regarding the COVID-19 pandemic (e.g. social distancing).  This format is felt to be most appropriate for this patient at this time.  All issues noted in this document were discussed and addressed.  No physical exam was performed (except for noted visual exam findings with Video Visits).   I connected with Melody Acero by a video enabled telemedicine application and verified that I am speaking with the correct person using two identifiers. Location patient: home Location provider: work  Persons participating in the virtual visit: patient, provider  I discussed the limitations, risks, security and privacy concerns of performing an evaluation and management service by video and the availability of in person appointments.  The patient expressed understanding and agreed to proceed.   Reason for visit: scheduled follow up.  HPI: She reports she is doing relatively well.  Saw Dr Bary Castilla 11/2018.  Mammogram ok.  Recommended f/u one year - due 03/2019.  Was evaluated 10/2018 by cardiology for DOE and heart murmur.  It was felt that the exertional dyspnea was a combination of deconditioning and weight gain.  She has been working increased hours and not exercising as much.  She has started exercising recently.  Feels better since starting.  No chest pain.  ECHO - mild - moderate TR, MR and mild calcification.  EF 60-65%.  Blood pressure and pulse doing better.  No acid reflux.  No abdominal pain.  Bowels moving.  Handling stress.     ROS: See pertinent positives and negatives per HPI.  Past Medical History:  Diagnosis Date  . Abnormal echocardiogram 01/2009   Mild LVH, EF >55%, no regional wall motion abnormalities, normal RV, mild aortic insufficiency, no aortic stenosis.  . Anxiety   . Heart murmur   .  Hyperlipidemia   . Hypertension    Edema with Norvasc. Unable to tolerate Lisinopril/ HCTZ.  Marland Kitchen Hypokalemia    Manifested by lip and finger tingling  . Palpitations    Event monitor 01/2009 with no significant arrhythmias  . TIA (transient ischemic attack) 01/2009   CT and MRI of head showed no evidence for stroke. Carotid dopplers showed no significant plaque    Past Surgical History:  Procedure Laterality Date  . CRYOABLATION     Of uterus  . UMBILICAL HERNIA REPAIR  2010   Dr Nicholes Stairs  . URETHRAL DILATION     removal of bladder polyps    Family History  Problem Relation Age of Onset  . Hypertension Mother   . Arrhythmia Mother        Atrial fibrillation  . Hypertension Father   . Valvular heart disease Father   . Hypertension Brother   . Hypertension Paternal Grandmother   . Coronary artery disease Neg Hx        Premature  . Breast cancer Neg Hx   . Colon cancer Neg Hx     SOCIAL HX: reviewed.    Current Outpatient Medications:  .  aspirin 81 MG EC tablet, Take 81 mg by mouth daily.  , Disp: , Rfl:  .  BYSTOLIC 2.5 MG tablet, , Disp: , Rfl:  .  fish oil-omega-3 fatty acids 1000 MG capsule, Take 0.75 capsules by mouth daily.  , Disp: , Rfl:  .  hydrochlorothiazide (MICROZIDE) 12.5 MG capsule, Take 1 capsule (  12.5 mg total) by mouth daily. (Patient taking differently: Take 12.5 mg by mouth as needed. ), Disp: 30 capsule, Rfl: 5 .  Red Yeast Rice Extract (RED YEAST RICE PO), Take by mouth daily., Disp: , Rfl:  .  sertraline (ZOLOFT) 25 MG tablet, TAKE 1 TABLET BY MOUTH TWICE DAILY, Disp: 60 tablet, Rfl: 2  EXAM:  VITALS per patient if applicable:  982/86, 85  GENERAL: alert, oriented, appears well and in no acute distress  HEENT: atraumatic, conjunttiva clear, no obvious abnormalities on inspection of external nose and ears  NECK: normal movements of the head and neck  LUNGS: on inspection no signs of respiratory distress, breathing rate appears normal, no  obvious gross SOB, gasping or wheezing  CV: no obvious cyanosis  PSYCH/NEURO: pleasant and cooperative, no obvious depression or anxiety, speech and thought processing grossly intact  ASSESSMENT AND PLAN:  Discussed the following assessment and plan:  Abnormal mammogram of right breast Saw Dr Bary Castilla.  Had f/u mammogram 11/2018.  Recommended f/u screening mammogram 03/2019.    DOE (dyspnea on exertion) Saw cardiology 10/2018.  ECHO as outlined.  Had recommended stopping bystolic.  She elected to continue.  Blood pressure and pulse as outlined.  Follow.  Schedule f/u with cardiology.  Had recommended f/u in 3 months.    Essential hypertension Blood pressure as outlined.  Continue current medication regimen.  Follow pressures.  Follow metabolic panel.    Hernia of abdominal wall Saw Dr Bary Castilla previously.  Will let me know when ready to have surgery.    Hyperglycemia Low carb diet and exercise.  Follow met b and a1c.    Hyperlipidemia LDL goal <70 Declines cholesterol medication.  Low cholesterol diet and exercise.  Follow lipid panel and liver function tests.      I discussed the assessment and treatment plan with the patient. The patient was provided an opportunity to ask questions and all were answered. The patient agreed with the plan and demonstrated an understanding of the instructions.   The patient was advised to call back or seek an in-person evaluation if the symptoms worsen or if the condition fails to improve as anticipated.   Einar Pheasant, MD

## 2019-02-19 ENCOUNTER — Encounter: Payer: Self-pay | Admitting: Internal Medicine

## 2019-02-19 NOTE — Assessment & Plan Note (Signed)
Saw Dr Bary Castilla previously.  Will let me know when ready to have surgery.

## 2019-02-19 NOTE — Assessment & Plan Note (Signed)
Saw cardiology 10/2018.  ECHO as outlined.  Had recommended stopping bystolic.  She elected to continue.  Blood pressure and pulse as outlined.  Follow.  Schedule f/u with cardiology.  Had recommended f/u in 3 months.

## 2019-02-19 NOTE — Assessment & Plan Note (Signed)
Saw Dr Bary Castilla.  Had f/u mammogram 11/2018.  Recommended f/u screening mammogram 03/2019.

## 2019-02-19 NOTE — Assessment & Plan Note (Signed)
Blood pressure as outlined.  Continue current medication regimen.  Follow pressures.  Follow metabolic panel.  

## 2019-02-19 NOTE — Assessment & Plan Note (Addendum)
Low carb diet and exercise.  Follow met b and a1c.

## 2019-02-19 NOTE — Assessment & Plan Note (Signed)
Declines cholesterol medication.  Low cholesterol diet and exercise.  Follow lipid panel and liver function tests.

## 2019-03-01 ENCOUNTER — Encounter: Payer: Self-pay | Admitting: Internal Medicine

## 2019-03-21 ENCOUNTER — Other Ambulatory Visit: Payer: 59

## 2019-03-26 NOTE — Progress Notes (Signed)
Follow-up Outpatient Visit Date: 03/28/2019  Primary Care Provider: Einar Pheasant, [place] [address] [id number]  Chief Complaint: Follow-up shortness of breath and blood pressure  HPI:  Margaret Mercado is a 63 y.o. year-old female with history of mild to moderate aortic and tricuspid regurgitation, hypertension, hyperlipidemia, obstructive sleep apnea, morbid obesity, and TIAs, who presents for follow-up of shortness of breath and heart murmur.  I met Margaret Mercado in late May, at which time she reported progressive exertional dyspnea over the last year.  We agreed to obtain an echocardiogram, which showed preserved LVEF with grade 1 diastolic dysfunction and mild to moderate aortic regurgitation.  I recommended stopping nebivolol due to resting bradycardia, though Margaret Mercado did not do this.  She held nebivolol for 1 day and became concerned that her systolic blood pressure increased to 140 mmHg.  Interestingly, she only takes 1/4 of a pill at this time.  Home blood pressure readings range 120-135/70-90.  Margaret Mercado reports that she has been trying to exercise, climbing a flight of stairs in her home up to 15 times per day.  She notes that her breathing is improving, though she still gets exertional dyspnea on the stairs.  She has not had any chest pain, palpitations, or lightheadedness.  Her resting heart rate will sometimes dip into the 50s.  She is also concerned about findings on recent echocardiogram, particularly mild to moderate aortic and tricuspid regurgitation.  She is still contemplating hernia repair and wonders if a stress test is necessary before proceeding with surgery.  She currently drinks 3 diet Cokes per day as well as at least 1 cup of caffeinated coffee.  --------------------------------------------------------------------------------------------------  Cardiovascular History & Procedures: Cardiovascular Problems:  TIA  Heart murmur   Dyspnea on exertion  Risk Factors:  TIA, hypertension, hyperlipidemia, and morbid obesity  Cath/PCI:  None  CV Surgery:  None  EP Procedures and Devices:  None  Non-Invasive Evaluation(s):  Echocardiogram (02/09/2019): Normal LV size and wall thickness.  LVEF 60-65% with grade 1 diastolic dysfunction.  Normal RV size and function.  Normal PA pressure.  Borderline dilated ascending aorta (3.5 cm).  Mild aortic valve calcification with mild to moderate aortic regurgitation.  Mild to moderate tricuspid regurgitation.  Echocardiogram (01/2009): LVEF greater than 55% with mild LVH.  Mild aortic regurgitation.  Normal RV size and function.  Recent CV Pertinent Labs: Lab Results  Component Value Date   CHOL 228 (H) 08/02/2018   HDL 44.70 08/02/2018   LDLCALC 141 (H) 08/15/2013   LDLDIRECT 153.0 08/02/2018   TRIG 210.0 (H) 08/02/2018   CHOLHDL 5 08/02/2018   K 3.8 08/02/2018   BUN 16 08/02/2018   CREATININE 0.70 08/02/2018    Past medical and surgical history were reviewed and updated in EPIC.  Current Meds  Medication Sig  . aspirin 81 MG EC tablet Take 81 mg by mouth daily.    [name] BYSTOLIC 2.5 MG tablet   . fish oil-omega-3 fatty acids 1000 MG capsule Take 0.75 capsules by mouth daily.    . hydrochlorothiazide (MICROZIDE) 12.5 MG capsule Take 1 capsule (12.5 mg total) by mouth daily. (Patient taking differently: Take 12.5 mg by mouth as needed. )  . Red Yeast Rice Extract (RED YEAST RICE PO) Take by mouth daily.  . sertraline (ZOLOFT) 25 MG tablet TAKE 1 TABLET BY MOUTH TWICE DAILY    Allergies: Vicodin [hydrocodone-acetaminophen], Amlodipine besylate, Cardizem [diltiazem hcl], Erythromycin, Hydrocodone-acetaminophen, Levaquin [levofloxacin in d5w], Lisinopril, Macrobid [nitrofurantoin  macrocrystal], Oxycodone-acetaminophen, Penicillins, and Sulfonamide derivatives  Social History   Tobacco Use  . Smoking status: Never Smoker  . Smokeless tobacco: Never Used   Substance Use Topics  . Alcohol use: No    Alcohol/week: 0.0 standard drinks  . Drug use: No    Family History  Problem Relation Age of Onset  . Hypertension Mother   . Arrhythmia Mother        Atrial fibrillation  . Hypertension Father   . Valvular heart disease Father   . Hypertension Brother   . Hypertension Paternal Grandmother   . Coronary artery disease Neg Hx        Premature  . Breast cancer Neg Hx   . Colon cancer Neg Hx     Review of Systems: A 12-system review of systems was performed and was negative except as noted in the HPI.  --------------------------------------------------------------------------------------------------  Physical Exam: BP 130/90 (BP Location: Left Arm, Patient Position: Sitting, Cuff Size: Normal)   Pulse 63   Temp 98.4 F (36.9 C)   Ht '5\' 2"'  (1.575 m)   Wt 217 lb 12 oz (98.8 kg)   BMI 39.83 kg/m   General: NAD. HEENT: No conjunctival pallor or scleral icterus.  Facemask in place. Neck: Supple without lymphadenopathy, thyromegaly, JVD, or HJR. No carotid bruit. Lungs: Normal work of breathing. Clear to auscultation bilaterally without wheezes or crackles. Heart: Regular rate and rhythm without murmurs, rubs, or gallops. Non-displaced PMI. Abd: Bowel sounds present. Soft, NT/ND without hepatosplenomegaly Ext: No lower extremity edema. Radial, PT, and DP pulses are 2+ bilaterally. Skin: Warm and dry without rash.  EKG: Normal sinus rhythm without significant abnormality.  Lab Results  Component Value Date   WBC 7.2 12/12/2017   HGB 13.1 12/12/2017   HCT 38.7 12/12/2017   MCV 95.9 12/12/2017   PLT 265.0 12/12/2017    Lab Results  Component Value Date   NA 139 08/02/2018   K 3.8 08/02/2018   CL 98 08/02/2018   CO2 31 08/02/2018   BUN 16 08/02/2018   CREATININE 0.70 08/02/2018   GLUCOSE 116 (H) 08/02/2018   ALT 16 08/02/2018    Lab Results  Component Value Date   CHOL 228 (H) 08/02/2018   HDL 44.70 08/02/2018    LDLCALC 141 (H) 08/15/2013   LDLDIRECT 153.0 08/02/2018   TRIG 210.0 (H) 08/02/2018   CHOLHDL 5 08/02/2018    --------------------------------------------------------------------------------------------------  ASSESSMENT AND PLAN: Aortic and tricuspid regurgitation: Mild to moderate aortic and tricuspid regurgitation noted on recent echocardiogram.  I do not believe that shortness of breath, which has actually been improving with physical activity, is directly related to these echo findings.  We discussed the importance of blood pressure control as well as management of her obesity and sleep apnea.  I encouraged Margaret Mercado to continue to exercise and lose weight.  In the setting of aortic regurgitation, we discussed discontinuation of nebivolol to minimize bradycardia.  Margaret Mercado is reluctant to do this, though I counseled her that taking a quarter of a tablet is likely not providing much blood pressure control.  I do not think that ischemia testing is indicated at this time, given improving dyspnea and lack of other anginal symptoms.  Hypertension: Blood pressure mildly elevated today at 130/90.  We discussed discontinuation of nebivolol given intermittent bradycardia at home.  She has been intolerant of multiple antihypertensive medications in the past.  I recommended increasing HCTZ to 25 mg daily and stopping nebivolol, though Margaret Mercado would  like to avoid any medication changes today.  I encouraged her to continue to exercise and to minimize her sodium intake.  Morbid obesity: BMI 39.8 with multiple comorbidities (hypertension and obstructive sleep apnea).  I encouraged continued weight loss through diet and exercise.  Follow-up: Return to clinic in 6 months.  Nelva Bush, MD 03/28/2019 8:22 AM

## 2019-03-28 ENCOUNTER — Ambulatory Visit (INDEPENDENT_AMBULATORY_CARE_PROVIDER_SITE_OTHER): Payer: 59 | Admitting: Internal Medicine

## 2019-03-28 ENCOUNTER — Other Ambulatory Visit: Payer: Self-pay

## 2019-03-28 ENCOUNTER — Encounter: Payer: Self-pay | Admitting: Internal Medicine

## 2019-03-28 VITALS — BP 130/90 | HR 63 | Temp 98.4°F | Ht 62.0 in | Wt 217.8 lb

## 2019-03-28 DIAGNOSIS — I351 Nonrheumatic aortic (valve) insufficiency: Secondary | ICD-10-CM | POA: Diagnosis not present

## 2019-03-28 DIAGNOSIS — I10 Essential (primary) hypertension: Secondary | ICD-10-CM

## 2019-03-28 DIAGNOSIS — I071 Rheumatic tricuspid insufficiency: Secondary | ICD-10-CM | POA: Insufficient documentation

## 2019-03-28 NOTE — Patient Instructions (Signed)
Medication Instructions:  Your physician recommends that you continue on your current medications as directed. Please refer to the Current Medication list given to you today.  If you need a refill on your cardiac medications before your next appointment, please call your pharmacy.   Lab work: NONE If you have labs (blood work) drawn today and your tests are completely normal, you will receive your results only by: . MyChart Message (if you have MyChart) OR . A paper copy in the mail If you have any lab test that is abnormal or we need to change your treatment, we will call you to review the results.  Testing/Procedures: NONE  Follow-Up: At [facility], you and your health needs are our priority.  As part of our continuing mission to provide you with exceptional heart care, we have created designated Provider Care Teams.  These Care Teams include your primary Cardiologist (physician) and Advanced Practice Providers (APPs -  Physician Assistants and Nurse Practitioners) who all work together to provide you with the care you need, when you need it. You will need a follow up appointment in 6 months.   Please call our office 2 months in advance to schedule this appointment.  You may see DR CHRISTOPHER END or one of the following Advanced Practice Providers on your designated Care Team:   Christopher Berge, NP Ryan Dunn, PA-C . Jacquelyn Visser, PA-C    

## 2019-04-11 ENCOUNTER — Other Ambulatory Visit (INDEPENDENT_AMBULATORY_CARE_PROVIDER_SITE_OTHER): Payer: 59

## 2019-04-11 ENCOUNTER — Other Ambulatory Visit: Payer: Self-pay

## 2019-04-11 ENCOUNTER — Ambulatory Visit (INDEPENDENT_AMBULATORY_CARE_PROVIDER_SITE_OTHER): Payer: 59

## 2019-04-11 DIAGNOSIS — I10 Essential (primary) hypertension: Secondary | ICD-10-CM | POA: Diagnosis not present

## 2019-04-11 DIAGNOSIS — Z23 Encounter for immunization: Secondary | ICD-10-CM

## 2019-04-11 DIAGNOSIS — R739 Hyperglycemia, unspecified: Secondary | ICD-10-CM

## 2019-04-11 DIAGNOSIS — E785 Hyperlipidemia, unspecified: Secondary | ICD-10-CM | POA: Diagnosis not present

## 2019-04-11 LAB — LIPID PANEL
Cholesterol: 217 mg/dL — ABNORMAL HIGH (ref 0–200)
HDL: 36.4 mg/dL — ABNORMAL LOW (ref >39.00)
NonHDL: 180.12
Total CHOL/HDL Ratio: 6
Triglycerides: 257 mg/dL — ABNORMAL HIGH (ref 0.0–149.0)
VLDL: 51.4 mg/dL — ABNORMAL HIGH (ref 0.0–40.0)

## 2019-04-11 LAB — BASIC METABOLIC PANEL
BUN: 14 mg/dL (ref 6–23)
CO2: 30 mEq/L (ref 19–32)
Calcium: 9.3 mg/dL (ref 8.4–10.5)
Chloride: 103 mEq/L (ref 96–112)
Creatinine, Ser: 0.73 mg/dL (ref 0.40–1.20)
GFR: 80.8 mL/min (ref >60.00)
Glucose, Bld: 144 mg/dL — ABNORMAL HIGH (ref 70–99)
Potassium: 3.8 mEq/L (ref 3.5–5.1)
Sodium: 141 mEq/L (ref 135–145)

## 2019-04-11 LAB — CBC WITH DIFFERENTIAL/PLATELET
Basophils Absolute: 0.1 10*3/uL (ref 0.0–0.1)
Basophils Relative: 1.3 % (ref 0.0–3.0)
Eosinophils Absolute: 0.3 10*3/uL (ref 0.0–0.7)
Eosinophils Relative: 4.6 % (ref 0.0–5.0)
HCT: 37.4 % (ref 36.0–46.0)
Hemoglobin: 12.7 g/dL (ref 12.0–15.0)
Lymphocytes Relative: 31.5 % (ref 12.0–46.0)
Lymphs Abs: 2.3 10*3/uL (ref 0.7–4.0)
MCHC: 34.1 g/dL (ref 30.0–36.0)
MCV: 96.1 fl (ref 78.0–100.0)
Monocytes Absolute: 0.6 10*3/uL (ref 0.1–1.0)
Monocytes Relative: 7.8 % (ref 3.0–12.0)
Neutro Abs: 4 10*3/uL (ref 1.4–7.7)
Neutrophils Relative %: 54.8 % (ref 43.0–77.0)
Platelets: 250 10*3/uL (ref 150.0–400.0)
RBC: 3.89 Mil/uL (ref 3.87–5.11)
RDW: 13.4 % (ref 11.5–15.5)
WBC: 7.3 10*3/uL (ref 4.0–10.5)

## 2019-04-11 LAB — HEPATIC FUNCTION PANEL
ALT: 16 U/L (ref 0–35)
AST: 13 U/L (ref 0–37)
Albumin: 4.1 g/dL (ref 3.5–5.2)
Alkaline Phosphatase: 57 U/L (ref 39–117)
Bilirubin, Direct: 0.1 mg/dL (ref 0.0–0.3)
Total Bilirubin: 0.4 mg/dL (ref 0.2–1.2)
Total Protein: 6.3 g/dL (ref 6.0–8.3)

## 2019-04-11 LAB — LDL CHOLESTEROL, DIRECT: Direct LDL: 132 mg/dL

## 2019-04-11 LAB — HEMOGLOBIN A1C: Hgb A1c MFr Bld: 6.8 % — ABNORMAL HIGH (ref 4.6–6.5)

## 2019-04-12 LAB — TSH: TSH: 5.07 u[IU]/mL — ABNORMAL HIGH (ref 0.35–4.50)

## 2019-04-17 ENCOUNTER — Telehealth: Payer: Self-pay | Admitting: Internal Medicine

## 2019-04-17 DIAGNOSIS — K439 Ventral hernia without obstruction or gangrene: Secondary | ICD-10-CM

## 2019-04-17 NOTE — Telephone Encounter (Signed)
I sent you a message regarding Ms Delagarza's lab results.  Also, she is due for f/u mammogram.  (due 03/2019).  Need to schedule if she is agreeable.  Also, she had seen Dr Bary Castilla previously about ventral hernia.  Dr Bary Castilla is in practice in town, whenever she is ready for f/u.  She had ask me about this at her last appt.

## 2019-04-18 NOTE — Telephone Encounter (Signed)
LMTCB

## 2019-04-24 NOTE — Telephone Encounter (Signed)
LMTCB

## 2019-04-25 ENCOUNTER — Telehealth: Payer: Self-pay | Admitting: Internal Medicine

## 2019-04-25 ENCOUNTER — Other Ambulatory Visit: Payer: Self-pay

## 2019-04-25 DIAGNOSIS — Z1231 Encounter for screening mammogram for malignant neoplasm of breast: Secondary | ICD-10-CM

## 2019-04-25 MED ORDER — ROSUVASTATIN CALCIUM 10 MG PO TABS: 10.0000 mg | ORAL_TABLET | Freq: Every day | ORAL | 1 refills | Status: DC

## 2019-04-25 NOTE — Telephone Encounter (Signed)
See other note

## 2019-04-25 NOTE — Telephone Encounter (Signed)
Pt called returning your call. She will be available til 10:00 am at [phone]. Please advise and Thank you!

## 2019-04-25 NOTE — Telephone Encounter (Signed)
Order signed for mammogram.  Order placed for surgery referral.   Dr Nicki Reaper

## 2019-04-25 NOTE — Telephone Encounter (Signed)
Pt agreeable for mammogram- will schedule. Also, would like to see Dr Bary Castilla

## 2019-04-25 NOTE — Addendum Note (Signed)
Addended by: Alisa Graff on: 04/25/2019 07:17 PM   Modules accepted: Orders

## 2019-04-27 ENCOUNTER — Encounter: Payer: Self-pay | Admitting: Internal Medicine

## 2019-05-28 ENCOUNTER — Telehealth: Payer: Self-pay | Admitting: Internal Medicine

## 2019-05-28 DIAGNOSIS — Z1231 Encounter for screening mammogram for malignant neoplasm of breast: Secondary | ICD-10-CM

## 2019-05-28 NOTE — Telephone Encounter (Signed)
Received notification that pt is overdue mammogram.  Please schedule if pt agreeable.  Also, she is getting lab 06/06/19.  Please schedule f/u appt (doxy). Does not appear to have one scheduled.

## 2019-05-29 NOTE — Telephone Encounter (Signed)
Left message for patient letting her know to schedule her mammogram, number left for patient. Also stated that she is due for f/u with Dr Nicki Reaper so she will need to call our office to schedule this as well.

## 2019-06-02 ENCOUNTER — Telehealth: Payer: Self-pay | Admitting: *Deleted

## 2019-06-02 DIAGNOSIS — E785 Hyperlipidemia, unspecified: Secondary | ICD-10-CM

## 2019-06-02 DIAGNOSIS — R7989 Other specified abnormal findings of blood chemistry: Secondary | ICD-10-CM

## 2019-06-02 NOTE — Telephone Encounter (Signed)
Please place future orders for lab appt.  

## 2019-06-03 NOTE — Telephone Encounter (Signed)
Lab orders placed.  

## 2019-06-06 ENCOUNTER — Other Ambulatory Visit: Payer: 59

## 2019-06-20 ENCOUNTER — Encounter: Payer: Self-pay | Admitting: Internal Medicine

## 2019-08-07 ENCOUNTER — Other Ambulatory Visit: Payer: Self-pay | Admitting: Internal Medicine

## 2019-10-24 ENCOUNTER — Telehealth: Payer: Self-pay | Admitting: Internal Medicine

## 2019-10-24 NOTE — Telephone Encounter (Signed)
3 attempts to schedule fu appt from recall list.   Deleting recall.   

## 2019-11-28 ENCOUNTER — Ambulatory Visit
Admission: RE | Admit: 2019-11-28 | Discharge: 2019-11-28 | Disposition: A | Discharge status: Home / Self Care | Payer: No Typology Code available for payment source | Source: Ambulatory Visit | Attending: Internal Medicine | Admitting: Internal Medicine

## 2019-11-28 DIAGNOSIS — Z1231 Encounter for screening mammogram for malignant neoplasm of breast: Secondary | ICD-10-CM | POA: Diagnosis not present

## 2020-02-08 ENCOUNTER — Ambulatory Visit: Payer: Self-pay | Admitting: Internal Medicine

## 2020-04-08 ENCOUNTER — Ambulatory Visit (INDEPENDENT_AMBULATORY_CARE_PROVIDER_SITE_OTHER): Payer: No Typology Code available for payment source | Admitting: Internal Medicine

## 2020-04-08 ENCOUNTER — Other Ambulatory Visit: Payer: Self-pay

## 2020-04-08 ENCOUNTER — Encounter: Payer: Self-pay | Admitting: Internal Medicine

## 2020-04-08 DIAGNOSIS — I10 Essential (primary) hypertension: Secondary | ICD-10-CM

## 2020-04-08 DIAGNOSIS — E785 Hyperlipidemia, unspecified: Secondary | ICD-10-CM

## 2020-04-08 DIAGNOSIS — R0600 Dyspnea, unspecified: Secondary | ICD-10-CM

## 2020-04-08 DIAGNOSIS — R739 Hyperglycemia, unspecified: Secondary | ICD-10-CM | POA: Diagnosis not present

## 2020-04-08 DIAGNOSIS — K439 Ventral hernia without obstruction or gangrene: Secondary | ICD-10-CM

## 2020-04-08 DIAGNOSIS — R0609 Other forms of dyspnea: Secondary | ICD-10-CM

## 2020-04-08 NOTE — Progress Notes (Signed)
Patient ID: Margaret Mercado, female   DOB: Jul 17, 1955, 64 y.o.   MRN: [id number]   Subjective:    Patient ID: Margaret Mercado, female    DOB: 12/05/1955, 64 y.o.   MRN: [id number]  HPI This visit occurred during the SARS-CoV-2 public health emergency.  Safety protocols were in place, including screening questions prior to the visit, additional usage of staff PPE, and extensive cleaning of exam room while observing appropriate contact time as indicated for disinfecting solutions.  Patient here for a scheduled follow up. I last saw her 02/15/19.  She has seen cardiology for exertion dyspnea.  ECHO - mild - moderate TR, MR and mild calcification.  EF 60-65%.  They had recommended her stop her bystolic.  Discussed with her.  Her blood pressure has down well on her current medication regimen and she desires not to stop at this time.  She has been working a lot of hours.  Feet swelling more when up on them.  Improved in am.  Some pain - lower back - stiffness when first gets up. Better - moving around more.  No chest pain.  Breathing overall stable.  No increased cough or congestion.  No acid reflux reported.  No abdominal pain.  Bowels moving.    Past Medical History:  Diagnosis Date  . Abnormal echocardiogram 01/2009   Mild LVH, EF >55%, no regional wall motion abnormalities, normal RV, mild aortic insufficiency, no aortic stenosis.  . Anxiety   . Heart murmur   . Hyperlipidemia   . Hypertension    Edema with Norvasc. Unable to tolerate Lisinopril/ HCTZ.  Marland Kitchen Hypokalemia    Manifested by lip and finger tingling  . Palpitations    Event monitor 01/2009 with no significant arrhythmias  . TIA (transient ischemic attack) 01/2009   CT and MRI of head showed no evidence for stroke. Carotid dopplers showed no significant plaque   Past Surgical History:  Procedure Laterality Date  . CRYOABLATION     Of uterus  . UMBILICAL HERNIA REPAIR  2010   Dr Nicholes Stairs  . URETHRAL DILATION     removal of bladder  polyps   Family History  Problem Relation Age of Onset  . Hypertension Mother   . Arrhythmia Mother        Atrial fibrillation  . Hypertension Father   . Valvular heart disease Father   . Hypertension Brother   . Hypertension Paternal Grandmother   . Coronary artery disease Neg Hx        Premature  . Breast cancer Neg Hx   . Colon cancer Neg Hx    Social History   Socioeconomic History  . Marital status: Married    Spouse name: Not on file  . Number of children: 2  . Years of education: Not on file  . Highest education level: Not on file  Occupational History  . Occupation: Horticulturist, commercial    Comment: full time  Tobacco Use  . Smoking status: Never Smoker  . Smokeless tobacco: Never Used  Substance and Sexual Activity  . Alcohol use: No    Alcohol/week: 0.0 standard drinks  . Drug use: No  . Sexual activity: Not on file  Other Topics Concern  . Not on file  Social History Narrative   Married   Gets regular exercise   Social Determinants of Health   Financial Resource Strain:   . Difficulty of Paying Living Expenses: Not on file  Food Insecurity:   .  Worried About Charity fundraiser in the Last Year: Not on file  . Ran Out of Food in the Last Year: Not on file  Transportation Needs:   . Lack of Transportation (Medical): Not on file  . Lack of Transportation (Non-Medical): Not on file  Physical Activity:   . Days of Exercise per Week: Not on file  . Minutes of Exercise per Session: Not on file  Stress:   . Feeling of Stress : Not on file  Social Connections:   . Frequency of Communication with Friends and Family: Not on file  . Frequency of Social Gatherings with Friends and Family: Not on file  . Attends Religious Services: Not on file  . Active Member of Clubs or Organizations: Not on file  . Attends Archivist Meetings: Not on file  . Marital Status: Not on file    Outpatient Encounter Medications as of 04/08/2020  Medication Sig  .  aspirin 81 MG EC tablet Take 81 mg by mouth daily.    Marland Kitchen BYSTOLIC 2.5 MG tablet TAKE 1/2 TABLET BY MOUTH DAILY  . fish oil-omega-3 fatty acids 1000 MG capsule Take 0.75 capsules by mouth daily.    . hydrochlorothiazide (MICROZIDE) 12.5 MG capsule Take 1 capsule (12.5 mg total) by mouth daily. (Patient taking differently: Take 12.5 mg by mouth as needed. )  . Red Yeast Rice Extract (RED YEAST RICE PO) Take by mouth daily.  . rosuvastatin (CRESTOR) 10 MG tablet Take 1 tablet (10 mg total) by mouth daily.  . sertraline (ZOLOFT) 25 MG tablet TAKE 1 TABLET BY MOUTH TWICE DAILY   No facility-administered encounter medications on file as of 04/08/2020.    Review of Systems  Constitutional: Negative for appetite change and unexpected weight change.  HENT: Negative for congestion and sinus pressure.   Respiratory: Negative for cough and chest tightness.        Breathing stable.   Cardiovascular: Negative for chest pain and palpitations.       Leg swelling improved in am.    Gastrointestinal: Negative for diarrhea, nausea and vomiting.  Genitourinary: Negative for difficulty urinating and dysuria.  Musculoskeletal: Negative for gait problem and joint swelling.  Skin: Negative for color change and rash.  Neurological: Negative for dizziness, light-headedness and headaches.  Psychiatric/Behavioral: Negative for agitation and dysphoric mood.       Objective:    Physical Exam Vitals reviewed.  Constitutional:      General: She is not in acute distress.    Appearance: Normal appearance.  HENT:     Head: Normocephalic and atraumatic.     Right Ear: External ear normal.     Left Ear: External ear normal.  Eyes:     General: No scleral icterus.       Right eye: No discharge.        Left eye: No discharge.     Conjunctiva/sclera: Conjunctivae normal.  Neck:     Thyroid: No thyromegaly.  Cardiovascular:     Rate and Rhythm: Normal rate and regular rhythm.  Pulmonary:     Effort: No  respiratory distress.     Breath sounds: Normal breath sounds. No wheezing.  Abdominal:     General: Bowel sounds are normal.     Palpations: Abdomen is soft.     Tenderness: There is no abdominal tenderness.  Musculoskeletal:        General: No tenderness.     Cervical back: Neck supple. No tenderness.  Comments: No increased swelling today.   Lymphadenopathy:     Cervical: No cervical adenopathy.  Skin:    Findings: No erythema or rash.  Neurological:     Mental Status: She is alert.  Psychiatric:        Mood and Affect: Mood normal.        Behavior: Behavior normal.     BP 126/80   Pulse 89   Temp 97.8 F (36.6 C) (Oral)   Ht '5\' 2"'  (1.575 m)   Wt 218 lb (98.9 kg)   SpO2 96%   BMI 39.87 kg/m  Wt Readings from Last 3 Encounters:  04/08/20 218 lb (98.9 kg)  03/28/19 217 lb 12 oz (98.8 kg)  02/15/19 215 lb (97.5 kg)     Lab Results  Component Value Date   WBC 7.3 04/11/2019   HGB 12.7 04/11/2019   HCT 37.4 04/11/2019   PLT 250.0 04/11/2019   GLUCOSE 144 (H) 04/11/2019   CHOL 217 (H) 04/11/2019   TRIG 257.0 (H) 04/11/2019   HDL 36.40 (L) 04/11/2019   LDLDIRECT 132.0 04/11/2019   LDLCALC 141 (H) 08/15/2013   ALT 16 04/11/2019   AST 13 04/11/2019   NA 141 04/11/2019   K 3.8 04/11/2019   CL 103 04/11/2019   CREATININE 0.73 04/11/2019   BUN 14 04/11/2019   CO2 30 04/11/2019   TSH 5.07 (H) 04/11/2019   HGBA1C 6.8 (H) 04/11/2019   MICROALBUR <0.7 12/12/2017    MM 3D SCREEN BREAST BILATERAL  Result Date: 11/29/2019 CLINICAL DATA:  Screening. EXAM: DIGITAL SCREENING BILATERAL MAMMOGRAM WITH TOMO AND CAD COMPARISON:  Previous exam(s). ACR Breast Density Category b: There are scattered areas of fibroglandular density. FINDINGS: There are no findings suspicious for malignancy. Images were processed with CAD. IMPRESSION: No mammographic evidence of malignancy. A result letter of this screening mammogram will be mailed directly to the patient. RECOMMENDATION:  Screening mammogram in one year. (Code:SM-B-01Y) BI-RADS CATEGORY  1: Negative. Electronically Signed   By: Ammie Ferrier M.D.   On: 11/29/2019 16:04       Assessment & Plan:   Problem List Items Addressed This Visit    Ventral hernia    Previously saw Dr Bary Castilla.  Will notify when ready for surgery.  Follow.        Hyperlipidemia LDL goal <70    Has declined cholesterol medication.  Low cholesterol diet and exercise.  Follow lipid panel.       Relevant Orders   Hepatic function panel   Lipid panel   Hyperglycemia    Low carb diet and exercise.  Follow met b and a1c.       Relevant Orders   Hemoglobin A1c   Essential hypertension    Blood pressure has been doing well on bystolic and hctz.  Follow pressures.  Follow metabolic panel.       Relevant Orders   CBC with Differential/Platelet   TSH   Basic metabolic panel   DOE (dyspnea on exertion)    Evaluated by cardiology.  ECHO as outlined.  Felt to be related to deconditioning.  Discussed walking/exercise.  Follow.  Breathing overall stable.           Einar Pheasant, MD

## 2020-04-13 ENCOUNTER — Encounter: Payer: Self-pay | Admitting: Internal Medicine

## 2020-04-13 NOTE — Assessment & Plan Note (Signed)
Previously saw Dr Bary Castilla.  Will notify when ready for surgery.  Follow.

## 2020-04-13 NOTE — Assessment & Plan Note (Signed)
Low carb diet and exercise.  Follow met b and a1c.

## 2020-04-13 NOTE — Assessment & Plan Note (Signed)
Evaluated by cardiology.  ECHO as outlined.  Felt to be related to deconditioning.  Discussed walking/exercise.  Follow.  Breathing overall stable.

## 2020-04-13 NOTE — Assessment & Plan Note (Signed)
Has declined cholesterol medication.  Low cholesterol diet and exercise.  Follow lipid panel.   

## 2020-04-13 NOTE — Assessment & Plan Note (Signed)
Blood pressure has been doing well on bystolic and hctz.  Follow pressures.  Follow metabolic panel.

## 2020-04-16 ENCOUNTER — Other Ambulatory Visit: Payer: Self-pay

## 2020-04-16 ENCOUNTER — Emergency Department
Admission: EM | Admit: 2020-04-16 | Discharge: 2020-04-16 | Disposition: A | Discharge status: Home / Self Care | Payer: No Typology Code available for payment source | Attending: Emergency Medicine | Admitting: Emergency Medicine

## 2020-04-16 ENCOUNTER — Encounter: Payer: Self-pay | Admitting: Emergency Medicine

## 2020-04-16 DIAGNOSIS — R109 Unspecified abdominal pain: Secondary | ICD-10-CM | POA: Diagnosis present

## 2020-04-16 DIAGNOSIS — R110 Nausea: Secondary | ICD-10-CM | POA: Diagnosis not present

## 2020-04-16 DIAGNOSIS — Z5321 Procedure and treatment not carried out due to patient leaving prior to being seen by health care provider: Secondary | ICD-10-CM | POA: Insufficient documentation

## 2020-04-16 HISTORY — DX: Ventral hernia without obstruction or gangrene: K43.9

## 2020-04-16 LAB — COMPREHENSIVE METABOLIC PANEL
ALT: 41 U/L (ref 0–44)
AST: 46 U/L — ABNORMAL HIGH (ref 15–41)
Albumin: 4.4 g/dL (ref 3.5–5.0)
Alkaline Phosphatase: 55 U/L (ref 38–126)
Anion gap: 12 (ref 5–15)
BUN: 16 mg/dL (ref 8–23)
CO2: 28 mmol/L (ref 22–32)
Calcium: 9.4 mg/dL (ref 8.9–10.3)
Chloride: 97 mmol/L — ABNORMAL LOW (ref 98–111)
Creatinine, Ser: 0.8 mg/dL (ref 0.44–1.00)
GFR, Estimated: >60 mL/min (ref >60)
Glucose, Bld: 298 mg/dL — ABNORMAL HIGH (ref 70–99)
Potassium: 4.5 mmol/L (ref 3.5–5.1)
Sodium: 137 mmol/L (ref 135–145)
Total Bilirubin: 0.7 mg/dL (ref 0.3–1.2)
Total Protein: 7.4 g/dL (ref 6.5–8.1)

## 2020-04-16 LAB — CBC
HCT: 38.4 % (ref 36.0–46.0)
Hemoglobin: 12.8 g/dL (ref 12.0–15.0)
MCH: 32.2 pg (ref 26.0–34.0)
MCHC: 33.3 g/dL (ref 30.0–36.0)
MCV: 96.5 fL (ref 80.0–100.0)
Platelets: 237 10*3/uL (ref 150–400)
RBC: 3.98 MIL/uL (ref 3.87–5.11)
RDW: 13.3 % (ref 11.5–15.5)
WBC: 9.8 10*3/uL (ref 4.0–10.5)
nRBC: 0 % (ref 0.0–0.2)

## 2020-04-16 LAB — LIPASE, BLOOD: Lipase: 22 U/L (ref 11–51)

## 2020-04-16 MED ORDER — ONDANSETRON 4 MG PO TBDP: 4.0000 mg | ORAL_TABLET | Freq: Once | ORAL | Status: DC | PRN

## 2020-04-16 NOTE — ED Triage Notes (Signed)
Pt arrived via POV with reports of abdominal pain, pt states she has hx of hernia with mesh placement, pt states the mesh has come apart.  Pt reports the pain started around 8pm and the pain is increasing.  Pt reports she has taken 3 dramamine because the pain has caused nausea.   Pt states the pain radiates to L flank area as well.  Pt denies any pain with urination.

## 2020-04-18 ENCOUNTER — Encounter: Payer: Self-pay | Admitting: Internal Medicine

## 2020-04-22 ENCOUNTER — Ambulatory Visit: Payer: No Typology Code available for payment source | Attending: Internal Medicine

## 2020-04-22 ENCOUNTER — Other Ambulatory Visit: Payer: Self-pay | Admitting: Internal Medicine

## 2020-04-22 DIAGNOSIS — Z23 Encounter for immunization: Secondary | ICD-10-CM

## 2020-04-22 NOTE — Progress Notes (Signed)
   Cov[facility] Vaccination [facility]  Name:  BRANDE UNCAPHER    MRN: [id number] DOB: Apr 23, 1956  04/22/2020  Ms. Burck was observed post Covid-19 immunization for 15 minutes without incident. She was provided with Vaccine Information Sheet and instruction to access the V-Safe system.   Ms. Amir was instructed to call 911 with any severe reactions post vaccine: Marland Kitchen Difficulty breathing  . Swelling of face and throat  . A fast heartbeat  . A bad rash all over body  . Dizziness and weakness

## 2020-04-25 ENCOUNTER — Other Ambulatory Visit: Payer: Self-pay

## 2020-04-25 ENCOUNTER — Other Ambulatory Visit (INDEPENDENT_AMBULATORY_CARE_PROVIDER_SITE_OTHER): Payer: No Typology Code available for payment source

## 2020-04-25 DIAGNOSIS — I10 Essential (primary) hypertension: Secondary | ICD-10-CM | POA: Diagnosis not present

## 2020-04-25 DIAGNOSIS — E785 Hyperlipidemia, unspecified: Secondary | ICD-10-CM | POA: Diagnosis not present

## 2020-04-25 DIAGNOSIS — R739 Hyperglycemia, unspecified: Secondary | ICD-10-CM | POA: Diagnosis not present

## 2020-04-25 LAB — CBC WITH DIFFERENTIAL/PLATELET
Basophils Absolute: 0.1 10*3/uL (ref 0.0–0.1)
Basophils Relative: 1.1 % (ref 0.0–3.0)
Eosinophils Absolute: 0.2 10*3/uL (ref 0.0–0.7)
Eosinophils Relative: 4.3 % (ref 0.0–5.0)
HCT: 37.1 % (ref 36.0–46.0)
Hemoglobin: 12.6 g/dL (ref 12.0–15.0)
Lymphocytes Relative: 23.9 % (ref 12.0–46.0)
Lymphs Abs: 1.3 10*3/uL (ref 0.7–4.0)
MCHC: 34 g/dL (ref 30.0–36.0)
MCV: 94.9 fl (ref 78.0–100.0)
Monocytes Absolute: 0.5 10*3/uL (ref 0.1–1.0)
Monocytes Relative: 8.7 % (ref 3.0–12.0)
Neutro Abs: 3.5 10*3/uL (ref 1.4–7.7)
Neutrophils Relative %: 62 % (ref 43.0–77.0)
Platelets: 220 10*3/uL (ref 150.0–400.0)
RBC: 3.91 Mil/uL (ref 3.87–5.11)
RDW: 13.5 % (ref 11.5–15.5)
WBC: 5.6 10*3/uL (ref 4.0–10.5)

## 2020-04-25 LAB — BASIC METABOLIC PANEL
BUN: 8 mg/dL (ref 6–23)
CO2: 27 mEq/L (ref 19–32)
Calcium: 8.8 mg/dL (ref 8.4–10.5)
Chloride: 102 mEq/L (ref 96–112)
Creatinine, Ser: 0.69 mg/dL (ref 0.40–1.20)
GFR: 92.65 mL/min (ref >60.00)
Glucose, Bld: 204 mg/dL — ABNORMAL HIGH (ref 70–99)
Potassium: 4.1 mEq/L (ref 3.5–5.1)
Sodium: 137 mEq/L (ref 135–145)

## 2020-04-25 LAB — HEPATIC FUNCTION PANEL
ALT: 26 U/L (ref 0–35)
AST: 27 U/L (ref 0–37)
Albumin: 4 g/dL (ref 3.5–5.2)
Alkaline Phosphatase: 50 U/L (ref 39–117)
Bilirubin, Direct: 0.1 mg/dL (ref 0.0–0.3)
Total Bilirubin: 0.6 mg/dL (ref 0.2–1.2)
Total Protein: 6.2 g/dL (ref 6.0–8.3)

## 2020-04-25 LAB — LIPID PANEL
Cholesterol: 118 mg/dL (ref 0–200)
HDL: 37 mg/dL — ABNORMAL LOW (ref >39.00)
LDL Cholesterol: 43 mg/dL (ref 0–99)
NonHDL: 81.34
Total CHOL/HDL Ratio: 3
Triglycerides: 193 mg/dL — ABNORMAL HIGH (ref 0.0–149.0)
VLDL: 38.6 mg/dL (ref 0.0–40.0)

## 2020-04-25 LAB — TSH: TSH: 2.85 u[IU]/mL (ref 0.35–4.50)

## 2020-04-25 LAB — HEMOGLOBIN A1C: Hgb A1c MFr Bld: 7.6 % — ABNORMAL HIGH (ref 4.6–6.5)

## 2020-04-29 ENCOUNTER — Other Ambulatory Visit: Payer: Self-pay

## 2020-04-29 MED ORDER — METFORMIN HCL 500 MG PO TABS: 500.0000 mg | ORAL_TABLET | Freq: Two times a day (BID) | ORAL | 1 refills | Status: DC

## 2020-05-23 ENCOUNTER — Encounter: Payer: Self-pay | Admitting: Internal Medicine

## 2020-05-27 ENCOUNTER — Other Ambulatory Visit: Payer: Self-pay

## 2020-05-27 ENCOUNTER — Encounter: Payer: Self-pay | Admitting: Internal Medicine

## 2020-05-27 DIAGNOSIS — R739 Hyperglycemia, unspecified: Secondary | ICD-10-CM

## 2020-05-27 DIAGNOSIS — E1165 Type 2 diabetes mellitus with hyperglycemia: Secondary | ICD-10-CM | POA: Insufficient documentation

## 2020-05-27 MED ORDER — GLUCOSE BLOOD VI STRP: ORAL_STRIP | 12 refills | Status: DC

## 2020-05-27 NOTE — Telephone Encounter (Signed)
Ok to send in prescription, but let pt know that insurance probably will not cover to be checked 3-4x/day due to not being on insulin.

## 2020-06-20 ENCOUNTER — Ambulatory Visit: Payer: Self-pay | Admitting: Internal Medicine

## 2020-07-10 ENCOUNTER — Encounter: Payer: Self-pay | Admitting: *Deleted

## 2020-07-18 ENCOUNTER — Ambulatory Visit: Payer: No Typology Code available for payment source | Admitting: Internal Medicine

## 2020-07-22 IMAGING — MG MM DIGITAL SCREENING BILAT W/ TOMO W/ CAD
8 series · 8 of 24 positions shown · non-contrast
Comparison: Previous exam(s).

CLINICAL DATA: Screening.

EXAM:
DIGITAL SCREENING BILATERAL MAMMOGRAM WITH TOMO AND CAD

[R CC synth-2D]
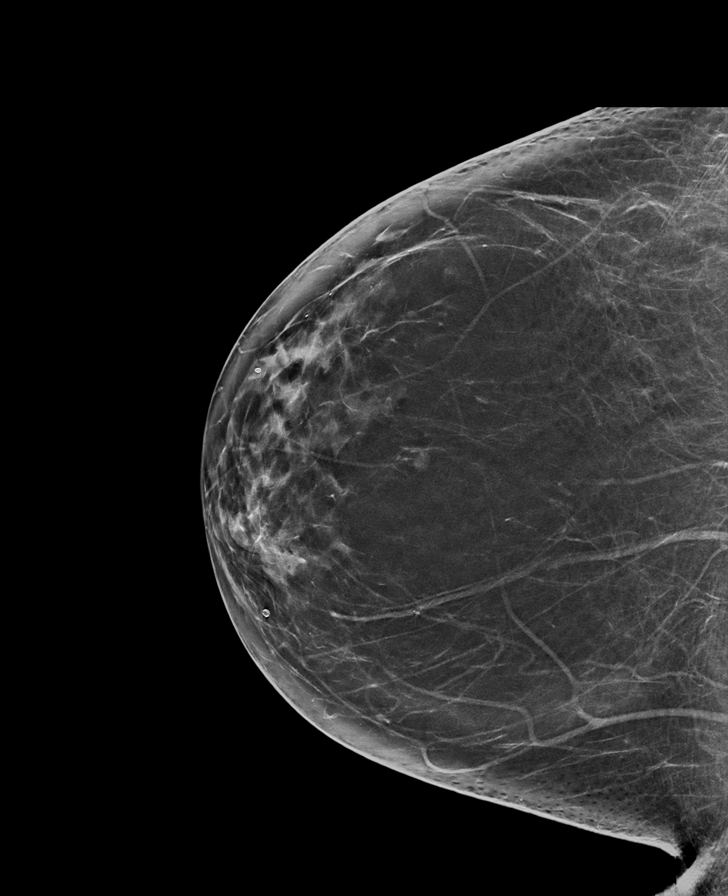

[L CC synth-2D]
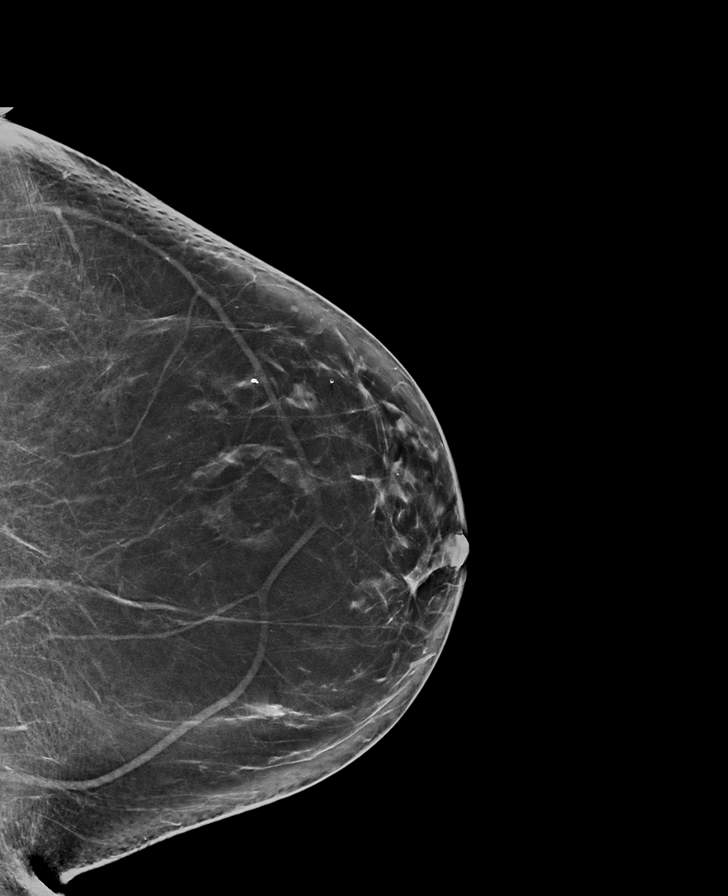

[R MLO synth-2D]
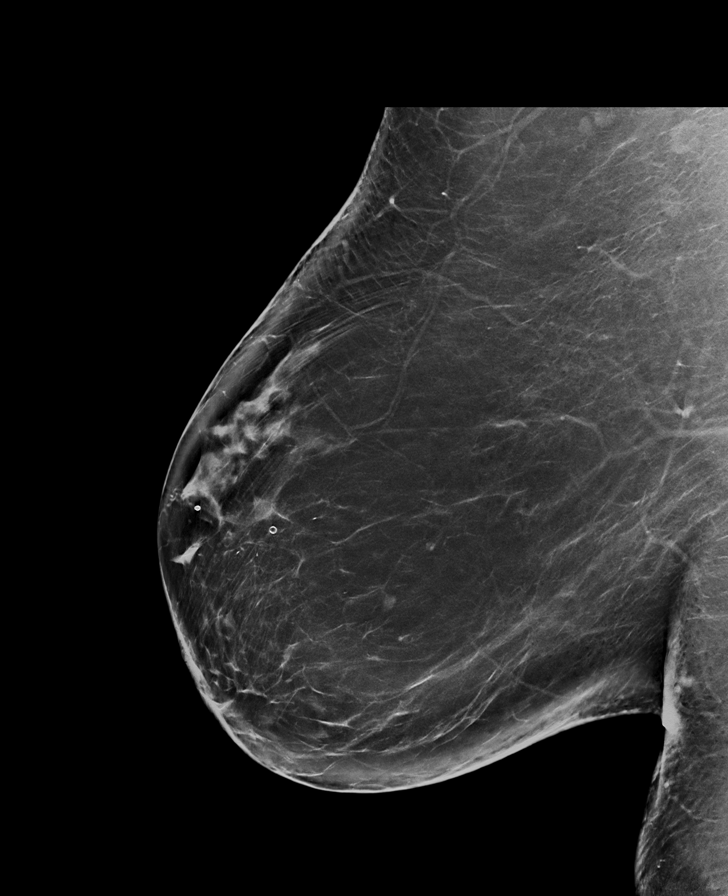

[L MLO synth-2D]
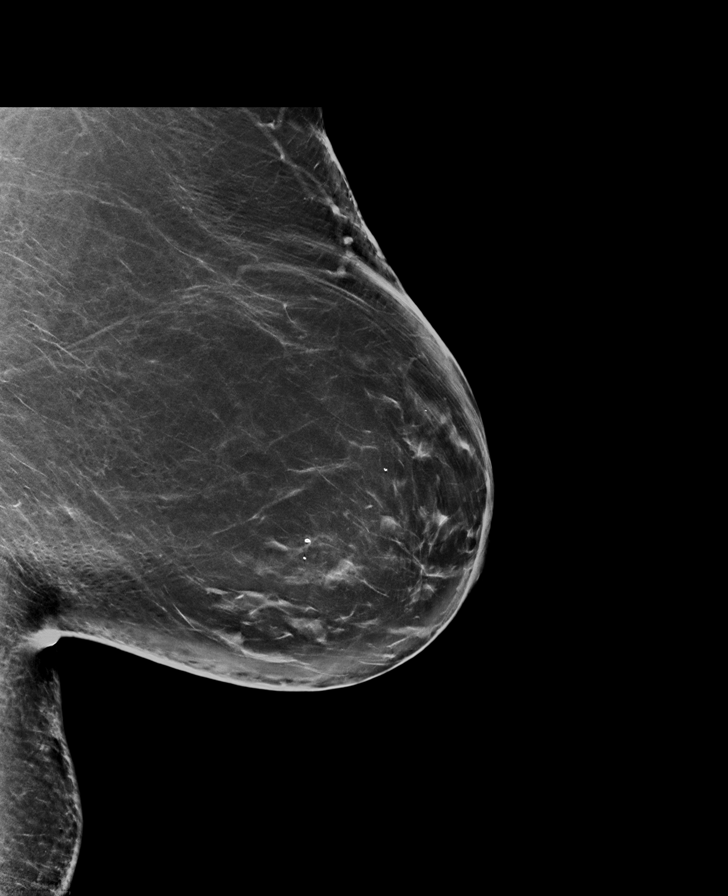

[R CC tomo · tomo slice 39/78.0]
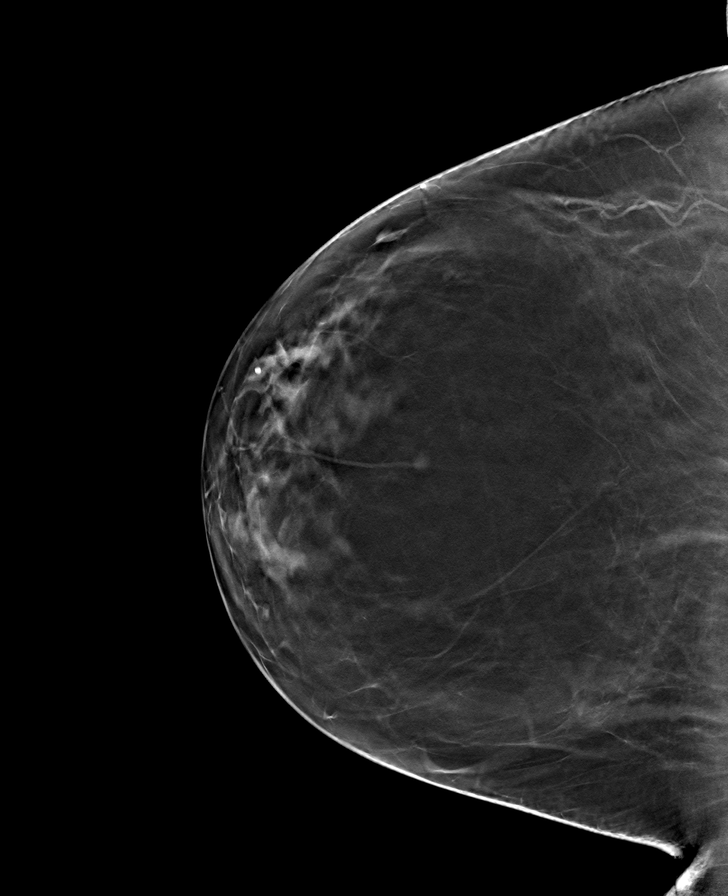

[L CC tomo · tomo slice 40/79.0]
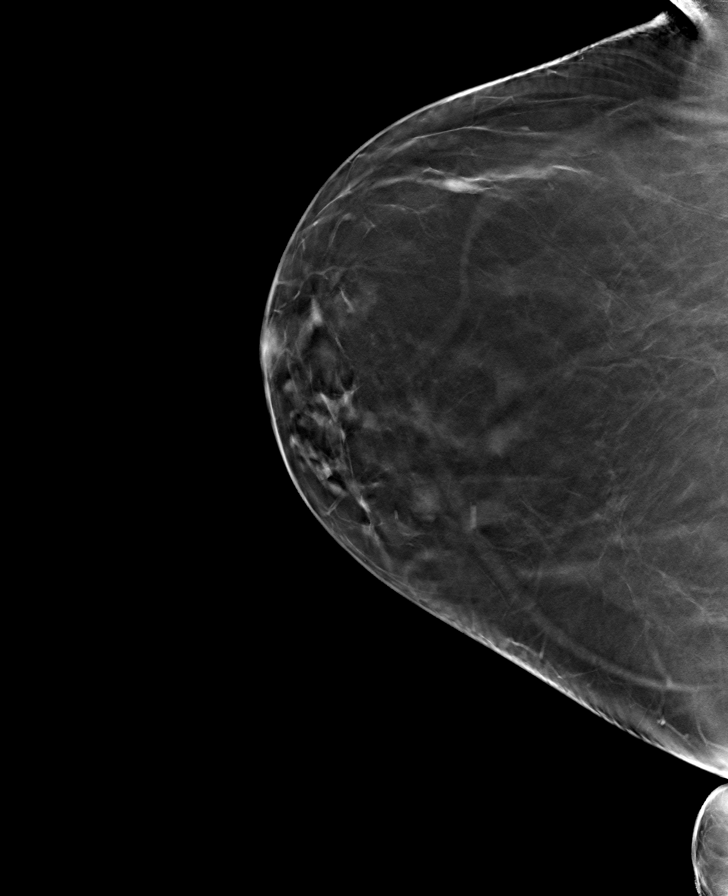

[L MLO tomo · tomo slice 47/94.0]
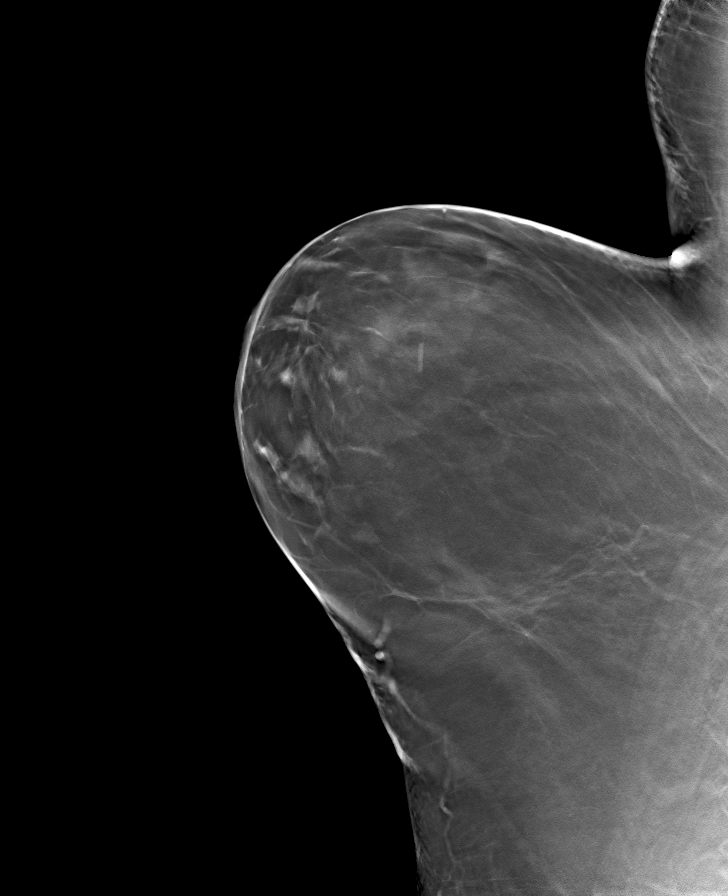

[R MLO tomo · tomo slice 48/95.0]
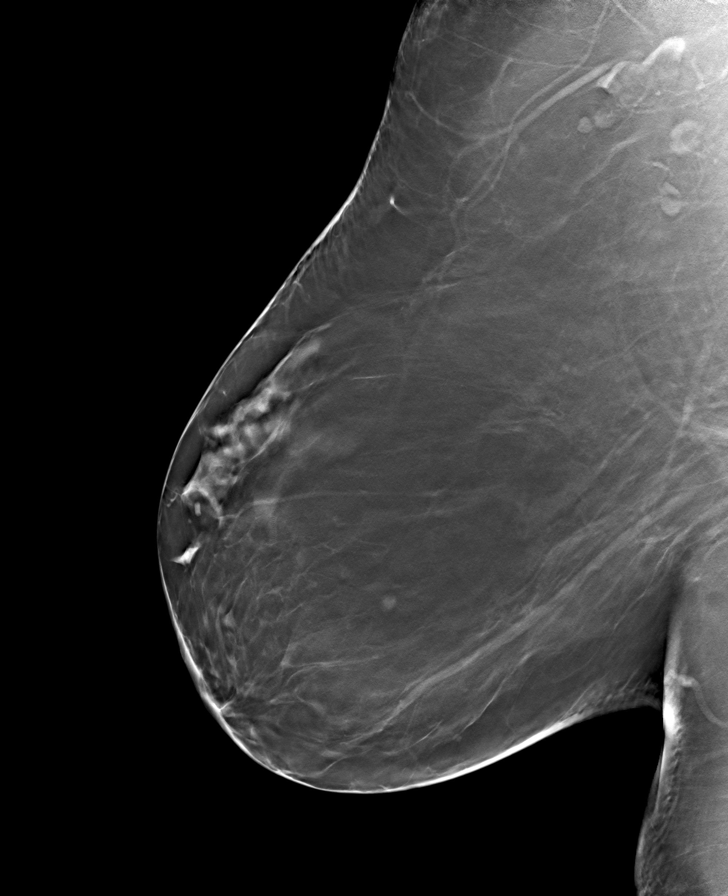

[8 of 24 positions shown; findings below may reference images not displayed]

ACR Breast Density Category b: There are scattered areas of
fibroglandular density.
FINDINGS: In the right breast, a possible mass warrants further evaluation. In
the left breast, no findings suspicious for malignancy. Images were
processed with CAD.
IMPRESSION: Further evaluation is suggested for possible mass in the right
breast.

RECOMMENDATION:
Diagnostic mammogram and possibly ultrasound of the right breast.
(Code:T1-A-550)

The patient will be contacted regarding the findings, and additional
imaging will be scheduled.

BI-RADS CATEGORY  0: Incomplete. Need additional imaging evaluation
and/or prior mammograms for comparison.

## 2020-08-22 ENCOUNTER — Ambulatory Visit: Payer: No Typology Code available for payment source | Admitting: Internal Medicine

## 2020-10-31 ENCOUNTER — Encounter: Payer: No Typology Code available for payment source | Admitting: Internal Medicine

## 2020-11-14 ENCOUNTER — Other Ambulatory Visit: Payer: Self-pay | Admitting: Internal Medicine

## 2020-11-14 ENCOUNTER — Encounter: Payer: Self-pay | Admitting: Internal Medicine

## 2020-11-14 ENCOUNTER — Other Ambulatory Visit: Payer: Self-pay

## 2020-11-15 MED FILL — Nebivolol HCl Tab 2.5 MG (Base Equivalent): ORAL | 90 days supply | Qty: 45 | Fill #0 | Status: AC

## 2020-11-15 NOTE — Telephone Encounter (Signed)
rx sent in for bystolic.  Need to make sure keeps appt.

## 2020-11-17 ENCOUNTER — Other Ambulatory Visit: Payer: Self-pay

## 2020-11-17 NOTE — Telephone Encounter (Signed)
Per chart, rx has been sent in

## 2020-12-08 ENCOUNTER — Other Ambulatory Visit: Payer: Self-pay

## 2020-12-08 MED FILL — Glucose Blood Test Strip: 25 days supply | Qty: 100 | Fill #0 | Status: AC

## 2020-12-17 ENCOUNTER — Other Ambulatory Visit (HOSPITAL_COMMUNITY): Payer: Self-pay

## 2021-01-12 ENCOUNTER — Other Ambulatory Visit: Payer: Self-pay

## 2021-01-12 DIAGNOSIS — Z7984 Long term (current) use of oral hypoglycemic drugs: Secondary | ICD-10-CM | POA: Diagnosis not present

## 2021-01-12 DIAGNOSIS — M79662 Pain in left lower leg: Secondary | ICD-10-CM | POA: Insufficient documentation

## 2021-01-12 DIAGNOSIS — E119 Type 2 diabetes mellitus without complications: Secondary | ICD-10-CM | POA: Diagnosis not present

## 2021-01-12 DIAGNOSIS — M7122 Synovial cyst of popliteal space [Baker], left knee: Secondary | ICD-10-CM | POA: Insufficient documentation

## 2021-01-12 DIAGNOSIS — Z7982 Long term (current) use of aspirin: Secondary | ICD-10-CM | POA: Diagnosis not present

## 2021-01-12 DIAGNOSIS — W010XXA Fall on same level from slipping, tripping and stumbling without subsequent striking against object, initial encounter: Secondary | ICD-10-CM | POA: Diagnosis not present

## 2021-01-12 DIAGNOSIS — Z79899 Other long term (current) drug therapy: Secondary | ICD-10-CM | POA: Diagnosis not present

## 2021-01-12 DIAGNOSIS — S8012XA Contusion of left lower leg, initial encounter: Secondary | ICD-10-CM | POA: Insufficient documentation

## 2021-01-12 DIAGNOSIS — I10 Essential (primary) hypertension: Secondary | ICD-10-CM | POA: Insufficient documentation

## 2021-01-12 DIAGNOSIS — L03116 Cellulitis of left lower limb: Secondary | ICD-10-CM | POA: Diagnosis not present

## 2021-01-12 DIAGNOSIS — S8992XA Unspecified injury of left lower leg, initial encounter: Secondary | ICD-10-CM | POA: Diagnosis present

## 2021-01-12 LAB — CBC WITH DIFFERENTIAL/PLATELET
Abs Immature Granulocytes: 0.02 10*3/uL (ref 0.00–0.07)
Basophils Absolute: 0.1 10*3/uL (ref 0.0–0.1)
Basophils Relative: 1 %
Eosinophils Absolute: 0.2 10*3/uL (ref 0.0–0.5)
Eosinophils Relative: 3 %
HCT: 37.2 % (ref 36.0–46.0)
Hemoglobin: 12.8 g/dL (ref 12.0–15.0)
Immature Granulocytes: 0 %
Lymphocytes Relative: 28 %
Lymphs Abs: 2 10*3/uL (ref 0.7–4.0)
MCH: 33.1 pg (ref 26.0–34.0)
MCHC: 34.4 g/dL (ref 30.0–36.0)
MCV: 96.1 fL (ref 80.0–100.0)
Monocytes Absolute: 0.5 10*3/uL (ref 0.1–1.0)
Monocytes Relative: 7 %
Neutro Abs: 4.4 10*3/uL (ref 1.7–7.7)
Neutrophils Relative %: 61 %
Platelets: 237 10*3/uL (ref 150–400)
RBC: 3.87 MIL/uL (ref 3.87–5.11)
RDW: 13.5 % (ref 11.5–15.5)
WBC: 7.1 10*3/uL (ref 4.0–10.5)
nRBC: 0 % (ref 0.0–0.2)

## 2021-01-12 LAB — COMPREHENSIVE METABOLIC PANEL
ALT: 19 U/L (ref 0–44)
AST: 18 U/L (ref 15–41)
Albumin: 4.4 g/dL (ref 3.5–5.0)
Alkaline Phosphatase: 56 U/L (ref 38–126)
Anion gap: 10 (ref 5–15)
BUN: 13 mg/dL (ref 8–23)
CO2: 26 mmol/L (ref 22–32)
Calcium: 9.4 mg/dL (ref 8.9–10.3)
Chloride: 105 mmol/L (ref 98–111)
Creatinine, Ser: 0.69 mg/dL (ref 0.44–1.00)
GFR, Estimated: >60 mL/min (ref >60)
Glucose, Bld: 117 mg/dL — ABNORMAL HIGH (ref 70–99)
Potassium: 3.7 mmol/L (ref 3.5–5.1)
Sodium: 141 mmol/L (ref 135–145)
Total Bilirubin: 0.8 mg/dL (ref 0.3–1.2)
Total Protein: 7.3 g/dL (ref 6.5–8.1)

## 2021-01-12 LAB — TROPONIN I (HIGH SENSITIVITY): Troponin I (High Sensitivity): 3 ng/L (ref <18)

## 2021-01-12 NOTE — ED Triage Notes (Signed)
Pt presents to the ED with c/o hypertension and redness to LLE. Pt states that she had an episode of dyspnea at home which prompted her to check her blood. Pt found her SBP in the 200's. Pt also c/o  pain and redness to LLE that has progressively worsened since tripping and falling on Thursday.

## 2021-01-13 ENCOUNTER — Emergency Department: Payer: No Typology Code available for payment source

## 2021-01-13 ENCOUNTER — Other Ambulatory Visit: Payer: Self-pay

## 2021-01-13 ENCOUNTER — Emergency Department
Admission: EM | Admit: 2021-01-13 | Discharge: 2021-01-13 | Disposition: A | Discharge status: Home / Self Care | Payer: No Typology Code available for payment source | Attending: Emergency Medicine | Admitting: Emergency Medicine

## 2021-01-13 ENCOUNTER — Encounter: Payer: Self-pay | Admitting: Radiology

## 2021-01-13 DIAGNOSIS — T148XXA Other injury of unspecified body region, initial encounter: Secondary | ICD-10-CM

## 2021-01-13 DIAGNOSIS — M7122 Synovial cyst of popliteal space [Baker], left knee: Secondary | ICD-10-CM

## 2021-01-13 DIAGNOSIS — L03116 Cellulitis of left lower limb: Secondary | ICD-10-CM

## 2021-01-13 DIAGNOSIS — I10 Essential (primary) hypertension: Secondary | ICD-10-CM

## 2021-01-13 MED ORDER — CEPHALEXIN 500 MG PO CAPS
500.0000 mg | ORAL_CAPSULE | Freq: Once | ORAL | Status: AC
  Administered 2021-01-13: 500 mg via ORAL
  Filled 2021-01-13: qty 1

## 2021-01-13 MED ORDER — KETOROLAC TROMETHAMINE 60 MG/2ML IM SOLN
30.0000 mg | Freq: Once | INTRAMUSCULAR | Status: AC
  Administered 2021-01-13: 30 mg via INTRAMUSCULAR
  Filled 2021-01-13: qty 2

## 2021-01-13 MED ORDER — HYDROCODONE-ACETAMINOPHEN 7.5-325 MG/15ML PO SOLN
10.0000 mL | Freq: Four times a day (QID) | ORAL | 0 refills | Status: DC | PRN
Start: 2021-01-13 — End: 2021-02-03
  Filled 2021-01-13: qty 120, 3d supply, fill #0

## 2021-01-13 MED ORDER — CEPHALEXIN 500 MG PO CAPS
500.0000 mg | ORAL_CAPSULE | Freq: Three times a day (TID) | ORAL | 0 refills | Status: DC
  Filled 2021-01-13: qty 21, 7d supply, fill #0

## 2021-01-13 NOTE — Discharge Instructions (Addendum)
1.  You may take Ibuprofen as needed for pain, Hycet elixir as needed for more severe pain. 2.  Take antibiotic as prescribed (Keflex '500mg'$   3 times daily x7 days). 3.  Apply Ace wrap during the day to reduce swelling.  Keep left leg elevated is much as possible throughout the day. 4.  Return to the ER for worsening symptoms, persistent vomiting, difficulty breathing or other concerns.

## 2021-01-13 NOTE — ED Provider Notes (Signed)
[facility] Emergency Department Provider Note   ____________________________________________   Event Date/Time   First MD Initiated Contact with Patient 01/13/21 0214     (approximate)  I have reviewed the triage vital signs and the nursing notes.   HISTORY  Chief Complaint Hypertension and Leg Injury    HPI Margaret Mercado is a 65 y.o. female presents to the ED from home with a chief complaint of elevated blood pressure and redness to her left lower extremity.  Patient sprained both her ankles early last week, left 1 was worse than the right.  Several days ago she stood on her left leg with the intention of kicking a dog bed with her right leg but her left leg buckled underneath her and she fell into her fireplace.  Did not strike her head or suffer LOC.  Suffered contusion to her chin/neck and left lower leg.  Complains of increasing redness to her left lower leg since the injury.  Tonight she had a brief episode of dyspnea which prompted her to check her blood pressure and found her systolic in the 092Z which is high for her.  Denies fever, cough, headache, vision changes, neck pain, chest pain, shortness of breath, abdominal pain, nausea, vomiting or dizziness.     Past Medical History:  Diagnosis Date   Abnormal echocardiogram 01/2009   Mild LVH, EF >55%, no regional wall motion abnormalities, normal RV, mild aortic insufficiency, no aortic stenosis.   Anxiety    Heart murmur    Hyperlipidemia    Hypertension    Edema with Norvasc. Unable to tolerate Lisinopril/ HCTZ.   Hypokalemia    Manifested by lip and finger tingling   Palpitations    Event monitor 01/2009 with no significant arrhythmias   TIA (transient ischemic attack) 01/2009   CT and MRI of head showed no evidence for stroke. Carotid dopplers showed no significant plaque   Ventral hernia     Patient Active Problem List   Diagnosis Date Noted   Type 2 diabetes mellitus with  hyperglycemia ([place]) 05/27/2020   Tricuspid valve insufficiency 03/28/2019   Heart murmur 11/08/2018   History of TIA (transient ischemic attack) 11/08/2018   Abnormal mammogram of right breast 05/03/2018   DOE (dyspnea on exertion) 12/13/2017   Stress 12/13/2017   Hyperglycemia 12/12/2017   Healthcare maintenance 05/17/2016   Abdominal pain, left lower quadrant 08/19/2013   Ventral hernia 08/19/2013   Pyuria 08/19/2013   Sleep apnea 06/17/2012   FLUSHING 02/14/2009   Hyperlipidemia LDL goal <70 01/30/2009   Essential hypertension 01/30/2009   CARDIAC ARRHYTHMIA 01/30/2009   Transient cerebral ischemia 01/30/2009    Past Surgical History:  Procedure Laterality Date   CRYOABLATION     Of uterus   UMBILICAL HERNIA REPAIR  2010   Dr Nicholes Stairs   URETHRAL DILATION     removal of bladder polyps    Prior to Admission medications   Medication Sig Start Date End Date Taking? Authorizing Provider  cephALEXin (KEFLEX) 500 MG capsule Take 1 capsule (500 mg total) by mouth 3 (three) times daily. 01/13/21  Yes Paulette Blanch, MD  HYDROcodone-acetaminophen (HYCET) 7.5-325 mg/15 ml solution Take 10 mLs by mouth every 6 (six) hours as needed for moderate pain. 01/13/21 01/13/22 Yes Paulette Blanch, MD  aspirin 81 MG EC tablet Take 81 mg by mouth daily.      [provider]  Blood Glucose Monitoring Suppl (FREESTYLE FREEDOM LITE) w/Device KIT USE AS  DIRECTED TO CHECK BLOOD SUGARS 3 TO 4 TIMES DAILY 05/27/20 05/27/21  Einar Pheasant, MD  COVID-19 mRNA vaccine, Pfizer, 30 MCG/0.3ML injection USE AS DIRECTED 04/22/20 04/22/21  Carlyle Basques, MD  fish oil-omega-3 fatty acids 1000 MG capsule Take 0.75 capsules by mouth daily.      [provider]  glucose blood test strip USE AS INSTRUCTED TO CHECK BLOOD SUGAR 3 TO 4 TIMES DAILY 05/27/20 05/27/21  Einar Pheasant, MD  hydrochlorothiazide (MICROZIDE) 12.5 MG capsule Take 1 capsule (12.5 mg total) by mouth daily. Patient taking differently: Take  12.5 mg by mouth as needed.  08/02/18   Einar Pheasant, MD  Lancets (FREESTYLE) lancets USE AS DIRECTED TO CHECK BLOOD SUGARS 3 TO 4 TIMES DAILY 05/27/20 05/27/21  Einar Pheasant, MD  metFORMIN (GLUCOPHAGE) 500 MG tablet TAKE 1 TABLET (500 MG TOTAL) BY MOUTH 2 (TWO) TIMES DAILY WITH A MEAL. 04/29/20 04/29/21  Einar Pheasant, MD  nebivolol (BYSTOLIC) 2.5 MG tablet TAKE 1/2 TABLET BY MOUTH DAILY 11/15/20   Einar Pheasant, MD  Red Yeast Rice Extract (RED YEAST RICE PO) Take by mouth daily.    [provider]  rosuvastatin (CRESTOR) 10 MG tablet Take 1 tablet (10 mg total) by mouth daily. 04/25/19   Einar Pheasant, MD  sertraline (ZOLOFT) 25 MG tablet TAKE 1 TABLET BY MOUTH TWICE DAILY 11/14/18   Einar Pheasant, MD    Allergies Vicodin [hydrocodone-acetaminophen], Amlodipine besylate, Cardizem [diltiazem hcl], Erythromycin, Hydrocodone-acetaminophen, Levaquin [levofloxacin in d5w], Lisinopril, Macrobid [nitrofurantoin macrocrystal], Oxycodone-acetaminophen, Penicillins, and Sulfonamide derivatives  Family History  Problem Relation Age of Onset   Hypertension Mother    Arrhythmia Mother        Atrial fibrillation   Hypertension Father    Valvular heart disease Father    Hypertension Brother    Hypertension Paternal Grandmother    Coronary artery disease Neg Hx        Premature   Breast cancer Neg Hx    Colon cancer Neg Hx     Social History Social History   Tobacco Use   Smoking status: Never   Smokeless tobacco: Never  Vaping Use   Vaping Use: Never used  Substance Use Topics   Alcohol use: No    Alcohol/week: 0.0 standard drinks   Drug use: No    Review of Systems  Constitutional: Positive for elevated blood pressure.  No fever/chills Eyes: No visual changes. ENT: No sore throat. Cardiovascular: Denies chest pain. Respiratory: Denies shortness of breath. Gastrointestinal: No abdominal pain.  No nausea, no vomiting.  No diarrhea.  No constipation. Genitourinary:  Negative for dysuria. Musculoskeletal: Positive for left lower leg pain and redness.  Negative for back pain. Skin: Negative for rash. Neurological: Negative for headaches, focal weakness or numbness.   ____________________________________________   PHYSICAL EXAM:  VITAL SIGNS: ED Triage Vitals  Enc Vitals Group     BP 01/12/21 1844 (!) 198/75     Pulse Rate 01/12/21 1844 84     Resp 01/12/21 1844 16     Temp 01/12/21 1844 98.3 F (36.8 C)     Temp Source 01/12/21 1844 Oral     SpO2 01/12/21 1844 99 %     Weight 01/12/21 1845 218 lb 4.1 oz (99 kg)     Height 01/12/21 1845 '5\' 2"'  (1.575 m)     Head Circumference --      Peak Flow --      Pain Score 01/12/21 1845 6     Pain Loc --  Pain Edu? --      Excl. in [place]? --     Constitutional: Alert and oriented. Well appearing and in no acute distress. Eyes: Conjunctivae are normal. PERRL. EOMI. Head: Atraumatic. Nose: No congestion/rhinnorhea. Mouth/Throat: Mucous membranes are moist.   Neck: No stridor.  Healing contusion beneath left chin/neck. Cardiovascular: Normal rate, regular rhythm. Grossly normal heart sounds.  Good peripheral circulation. Respiratory: Normal respiratory effort.  No retractions. Lungs CTAB.  No wheezing, rhonchi or rales. Gastrointestinal: Soft and nontender to light or deep palpation. No distention. No abdominal bruits. No CVA tenderness. Musculoskeletal: Left lower leg mildly swollen.  Anterior aspect with warmth and erythema, no streaking.  Hematoma noted.  2+ distal pulses.  Brisk, less than 5-second capillary refill.   Neurologic:  Normal speech and language. No gross focal neurologic deficits are appreciated.  Skin:  Skin is warm, dry and intact. No rash noted. Psychiatric: Mood and affect are normal. Speech and behavior are normal.  ____________________________________________   LABS (all labs ordered are listed, but only abnormal results are displayed)  Labs Reviewed  COMPREHENSIVE  METABOLIC PANEL - Abnormal; Notable for the following components:      Result Value   Glucose, Bld 117 (*)    All other components within normal limits  CBC WITH DIFFERENTIAL/PLATELET  TROPONIN I (HIGH SENSITIVITY)   ____________________________________________  EKG  None ____________________________________________  RADIOLOGY I, Darria Corvera J, personally viewed and evaluated these images (plain radiographs) as part of my medical decision making, as well as reviewing the written report by the radiologist.  ED MD interpretation: No fracture/dislocation, no DVT, complex popliteal cyst  Official radiology report(s): DG Tibia/Fibula Left  Result Date: 01/13/2021 CLINICAL DATA:  Fall, left leg pain EXAM: LEFT TIBIA AND FIBULA - 2 VIEW COMPARISON:  None. FINDINGS: Normal alignment. No fracture or dislocation. Incidental note is made of bicompartmental degenerative arthritis of the left knee involving the medial and patellofemoral compartments. Mild diffuse subcutaneous edema. IMPRESSION: No fracture or dislocation. Electronically Signed   By: Fidela Salisbury MD   On: 01/13/2021 02:53   US Venous Img Lower Unilateral Left (DVT)  Result Date: 01/13/2021 CLINICAL DATA:  Left leg pain and redness EXAM: LEFT LOWER EXTREMITY VENOUS DOPPLER ULTRASOUND TECHNIQUE: Gray-scale sonography with compression, as well as color and duplex ultrasound, were performed to evaluate the deep venous system(s) from the level of the common femoral vein through the popliteal and proximal calf veins. COMPARISON:  None. FINDINGS: VENOUS Normal compressibility of the common femoral, superficial femoral, and popliteal veins, as well as the visualized calf veins. Visualized portions of profunda femoral vein and great saphenous vein unremarkable. No filling defects to suggest DVT on grayscale or color Doppler imaging. Doppler waveforms show normal direction of venous flow, normal respiratory plasticity and response to augmentation.  Limited views of the contralateral common femoral vein are unremarkable. OTHER Complex cystic area in the popliteal fossa measuring 4.5 x 1.2 x 4.7 cm Limitations: none IMPRESSION: 1. No deep venous thrombosis in the left lower extremity. 2. Complex popliteal cyst measuring up to 4.7 cm in the popliteal fossa, likely a Baker cyst. Electronically Signed   By: Ulyses Jarred M.D.   On: 01/13/2021 03:46    ____________________________________________   PROCEDURES  Procedure(s) performed (including Critical Care):  Procedures   ____________________________________________   INITIAL IMPRESSION / ASSESSMENT AND PLAN / ED COURSE  As part of my medical decision making, I reviewed the following data within the [place] History obtained from family,  Nursing notes reviewed and incorporated, Labs reviewed, EKG interpreted, Old chart reviewed, Radiograph reviewed, Notes from prior ED visits, and [place] Controlled Substance Database     65 year old female presenting with elevated blood pressure and left lower extremity pain and swelling status post injury.  Differential diagnosis includes but is not limited to elevated blood pressure secondary to pain, cellulitis, DVT, hematoma, etc.  Laboratory results unremarkable.  Lungs are clear, patient voices no complaints of shortness of breath.  Will obtain plain film x-ray and DVT ultrasound of left lower leg.  Administer analgesia and reassess.  Clinical Course as of 01/13/21 0447  Tue Jan 13, 2021  0413 Updated patient and family member of imaging results.  Confirmed patient's medication allergies; states she has no problem taking penicillins and has no problem taking liquid hydrocodone.  Will Ace wrap left leg, placed on Keflex for cellulitis, hydrocodone elixir for pain and patient will follow up with her PCP this week.  Strict return precautions given.  Patient verbalizes understanding agrees with plan of care. [JS]    Clinical Course User  Index [JS] Paulette Blanch, MD     ____________________________________________   FINAL CLINICAL IMPRESSION(S) / ED DIAGNOSES  Final diagnoses:  Hypertension, unspecified type  Cellulitis of left lower extremity  Hematoma  Synovial cyst of left popliteal space     ED Discharge Orders          Ordered    cephALEXin (KEFLEX) 500 MG capsule  3 times daily        01/13/21 0416    HYDROcodone-acetaminophen (HYCET) 7.5-325 mg/15 ml solution  Every 6 hours PRN        01/13/21 0416             Note:  This document was prepared using Dragon voice recognition software and may include unintentional dictation errors.    Paulette Blanch, MD 01/13/21 [phone]

## 2021-01-13 NOTE — ED Notes (Signed)
Pt able to ambulate to the wr

## 2021-01-14 ENCOUNTER — Other Ambulatory Visit: Payer: Self-pay

## 2021-01-14 ENCOUNTER — Encounter: Payer: Self-pay | Admitting: Internal Medicine

## 2021-01-14 ENCOUNTER — Ambulatory Visit (INDEPENDENT_AMBULATORY_CARE_PROVIDER_SITE_OTHER): Payer: No Typology Code available for payment source | Admitting: Internal Medicine

## 2021-01-14 DIAGNOSIS — E785 Hyperlipidemia, unspecified: Secondary | ICD-10-CM

## 2021-01-14 DIAGNOSIS — I10 Essential (primary) hypertension: Secondary | ICD-10-CM | POA: Diagnosis not present

## 2021-01-14 DIAGNOSIS — M79605 Pain in left leg: Secondary | ICD-10-CM

## 2021-01-14 DIAGNOSIS — F439 Reaction to severe stress, unspecified: Secondary | ICD-10-CM

## 2021-01-14 DIAGNOSIS — E1165 Type 2 diabetes mellitus with hyperglycemia: Secondary | ICD-10-CM | POA: Diagnosis not present

## 2021-01-14 DIAGNOSIS — T148XXA Other injury of unspecified body region, initial encounter: Secondary | ICD-10-CM

## 2021-01-14 MED ORDER — MELOXICAM 15 MG PO TABS
ORAL_TABLET | ORAL | 2 refills | Status: DC
Start: 2021-01-14 — End: 2021-02-03
  Filled 2021-01-14: qty 30, 30d supply, fill #0

## 2021-01-14 MED ORDER — HYDROCHLOROTHIAZIDE 12.5 MG PO CAPS
12.5000 mg | ORAL_CAPSULE | Freq: Every day | ORAL | 2 refills | Status: DC
Start: 2021-01-14 — End: 2021-09-13
  Filled 2021-01-14: qty 90, 90d supply, fill #0

## 2021-01-14 NOTE — Progress Notes (Signed)
Patient ID: Margaret Mercado, female   DOB: [birth year], 65 y.o.   MRN: [id number]   Subjective:    Patient ID: Margaret Mercado, female    DOB: 02/28/1956, 65 y.o.   MRN: [id number]  HPI This visit occurred during the SARS-CoV-2 public health emergency.  Safety protocols were in place, including screening questions prior to the visit, additional usage of staff PPE, and extensive cleaning of exam room while observing appropriate contact time as indicated for disinfecting solutions.   Patient here for a physical exam.  Appt changed to an ER follow up.  Was evaluated 01/13/21 with redness and tenderness to lower extremity and elevated blood pressure.  States that approximately 8 days ago, she tripped over her dog and landed on her left knee. After that, worked long hours - standing on her feet a lot.  Then on 01/08/21, she was going to push a dog bed out of the way and fell.  Foot caught under a metal piece of the fireplace.  Metal flipped up and hit her under her chin.  She hit the chair and landed on her left hip.  No actual head injury.  No LOC. She went to work few days after this occurred.  Iced.  Noticed her leg became red and felt hot.  Blood pressure increased.  To ER for evaluation.  Ultrasound (lower extremity) - no DVT, complex popliteal cyst.  Ace wrap applied to leg.  Was diagnosed with cellulitis and placed on keflex.  Reports her leg is better.  Redness has decreased.  Still with increased pain/soreness - anterior lower leg - under knee.  Previously felt a "knot" under her chin.  This is smaller.  No chest pain reported.  Breathing stable.  No abdominal pain.  No increased hip pain.    Past Medical History:  Diagnosis Date   Abnormal echocardiogram 01/2009   Mild LVH, EF >55%, no regional wall motion abnormalities, normal RV, mild aortic insufficiency, no aortic stenosis.   Anxiety    Heart murmur    Hyperlipidemia    Hypertension    Edema with Norvasc. Unable to tolerate Lisinopril/ HCTZ.    Hypokalemia    Manifested by lip and finger tingling   Palpitations    Event monitor 01/2009 with no significant arrhythmias   TIA (transient ischemic attack) 01/2009   CT and MRI of head showed no evidence for stroke. Carotid dopplers showed no significant plaque   Ventral hernia    Past Surgical History:  Procedure Laterality Date   CRYOABLATION     Of uterus   UMBILICAL HERNIA REPAIR  2010   Dr Nicholes Stairs   URETHRAL DILATION     removal of bladder polyps   Family History  Problem Relation Age of Onset   Hypertension Mother    Arrhythmia Mother        Atrial fibrillation   Hypertension Father    Valvular heart disease Father    Hypertension Brother    Hypertension Paternal Grandmother    Coronary artery disease Neg Hx        Premature   Breast cancer Neg Hx    Colon cancer Neg Hx    Social History   Socioeconomic History   Marital status: Married    Spouse name: Not on file   Number of children: 2   Years of education: Not on file   Highest education level: Not on file  Occupational History   Occupation: Horticulturist, commercial  Comment: full time  Tobacco Use   Smoking status: Never   Smokeless tobacco: Never  Vaping Use   Vaping Use: Never used  Substance and Sexual Activity   Alcohol use: No    Alcohol/week: 0.0 standard drinks   Drug use: No   Sexual activity: Not on file  Other Topics Concern   Not on file  Social History Narrative   Married   Gets regular exercise   Social Determinants of Health   Financial Resource Strain: Not on file  Food Insecurity: Not on file  Transportation Needs: Not on file  Physical Activity: Not on file  Stress: Not on file  Social Connections: Not on file    Review of Systems  Constitutional:  Negative for appetite change, fever and unexpected weight change.  HENT:  Negative for congestion and sinus pressure.   Respiratory:  Negative for cough, chest tightness and shortness of breath.   Cardiovascular:   Negative for chest pain and palpitations.  Gastrointestinal:  Negative for abdominal pain, diarrhea, nausea and vomiting.  Genitourinary:  Negative for difficulty urinating and dysuria.  Musculoskeletal:        Knee, leg and ankle pain as outlined.    Skin:  Negative for rash.       Resolving hematoma under her chin.  Redness lower extremity - and hematoma.   Neurological:  Negative for dizziness, light-headedness and headaches.  Psychiatric/Behavioral:  Negative for agitation and dysphoric mood.       Objective:    Physical Exam Vitals reviewed.  Constitutional:      General: She is not in acute distress.    Appearance: Normal appearance.  HENT:     Head: Normocephalic and atraumatic.     Right Ear: External ear normal.     Left Ear: External ear normal.  Eyes:     General: No scleral icterus.       Right eye: No discharge.        Left eye: No discharge.     Conjunctiva/sclera: Conjunctivae normal.  Neck:     Thyroid: No thyromegaly.  Cardiovascular:     Rate and Rhythm: Normal rate and regular rhythm.  Pulmonary:     Effort: No respiratory distress.     Breath sounds: Normal breath sounds. No wheezing.  Abdominal:     General: Bowel sounds are normal.     Palpations: Abdomen is soft.     Tenderness: There is no abdominal tenderness.  Musculoskeletal:     Cervical back: Neck supple. No tenderness.     Comments: Increased swelling left ankle, lower leg.  Decreased erythema when compared to ER viist (viewed pictures).  Increased pain palpation - just below knee and ankle.  Increased discomfort with rotation of ankle.   Lymphadenopathy:     Cervical: No cervical adenopathy.  Skin:    Findings: No erythema or rash.  Neurological:     Mental Status: She is alert.  Psychiatric:        Mood and Affect: Mood normal.        Behavior: Behavior normal.    BP (!) 142/86   Pulse 85   Temp (!) 95.2 F (35.1 C)   Ht 5' 2.01" (1.575 m)   Wt 216 lb 12.8 oz (98.3 kg)   SpO2  96%   BMI 39.64 kg/m  Wt Readings from Last 3 Encounters:  01/14/21 216 lb 12.8 oz (98.3 kg)  01/12/21 218 lb 4.1 oz (99 kg)  04/16/20 218 lb (  98.9 kg)    Outpatient Encounter Medications as of 01/14/2021  Medication Sig   aspirin 81 MG EC tablet Take 81 mg by mouth daily.     Blood Glucose Monitoring Suppl (FREESTYLE FREEDOM LITE) w/Device KIT USE AS DIRECTED TO CHECK BLOOD SUGARS 3 TO 4 TIMES DAILY   cephALEXin (KEFLEX) 500 MG capsule Take 1 capsule by mouth 3 (three) times daily.   COVID-19 mRNA vaccine, Pfizer, 30 MCG/0.3ML injection USE AS DIRECTED   fish oil-omega-3 fatty acids 1000 MG capsule Take 0.75 capsules by mouth daily.     glucose blood test strip USE AS INSTRUCTED TO CHECK BLOOD SUGAR 3 TO 4 TIMES DAILY   HYDROcodone-acetaminophen (HYCET) 7.5-325 mg/15 ml solution Take 10 mLs by mouth every 6 (six) hours as needed for moderate pain.   Lancets (FREESTYLE) lancets USE AS DIRECTED TO CHECK BLOOD SUGARS 3 TO 4 TIMES DAILY   metFORMIN (GLUCOPHAGE) 500 MG tablet TAKE 1 TABLET (500 MG TOTAL) BY MOUTH 2 (TWO) TIMES DAILY WITH A MEAL.   nebivolol (BYSTOLIC) 2.5 MG tablet TAKE 1/2 TABLET BY MOUTH DAILY   Red Yeast Rice Extract (RED YEAST RICE PO) Take by mouth daily.   rosuvastatin (CRESTOR) 10 MG tablet Take 1 tablet (10 mg total) by mouth daily.   sertraline (ZOLOFT) 25 MG tablet TAKE 1 TABLET BY MOUTH TWICE DAILY   [DISCONTINUED] hydrochlorothiazide (MICROZIDE) 12.5 MG capsule Take 1 capsule (12.5 mg total) by mouth daily. (Patient taking differently: Take 12.5 mg by mouth as needed.)   hydrochlorothiazide (MICROZIDE) 12.5 MG capsule Take 1 capsule (12.5 mg total) by mouth daily.   No facility-administered encounter medications on file as of 01/14/2021.     Lab Results  Component Value Date   WBC 7.1 01/12/2021   HGB 12.8 01/12/2021   HCT 37.2 01/12/2021   PLT 237 01/12/2021   GLUCOSE 117 (H) 01/12/2021   CHOL 118 04/25/2020   TRIG 193.0 (H) 04/25/2020   HDL 37.00 (L)  04/25/2020   LDLDIRECT 132.0 04/11/2019   LDLCALC 43 04/25/2020   ALT 19 01/12/2021   AST 18 01/12/2021   NA 141 01/12/2021   K 3.7 01/12/2021   CL 105 01/12/2021   CREATININE 0.69 01/12/2021   BUN 13 01/12/2021   CO2 26 01/12/2021   TSH 2.85 04/25/2020   HGBA1C 7.6 (H) 04/25/2020   MICROALBUR <0.7 12/12/2017    DG Tibia/Fibula Left  Result Date: 01/13/2021 CLINICAL DATA:  Fall, left leg pain EXAM: LEFT TIBIA AND FIBULA - 2 VIEW COMPARISON:  None. FINDINGS: Normal alignment. No fracture or dislocation. Incidental note is made of bicompartmental degenerative arthritis of the left knee involving the medial and patellofemoral compartments. Mild diffuse subcutaneous edema. IMPRESSION: No fracture or dislocation. Electronically Signed   By: Fidela Salisbury MD   On: 01/13/2021 02:53   US Venous Img Lower Unilateral Left (DVT)  Result Date: 01/13/2021 CLINICAL DATA:  Left leg pain and redness EXAM: LEFT LOWER EXTREMITY VENOUS DOPPLER ULTRASOUND TECHNIQUE: Gray-scale sonography with compression, as well as color and duplex ultrasound, were performed to evaluate the deep venous system(s) from the level of the common femoral vein through the popliteal and proximal calf veins. COMPARISON:  None. FINDINGS: VENOUS Normal compressibility of the common femoral, superficial femoral, and popliteal veins, as well as the visualized calf veins. Visualized portions of profunda femoral vein and great saphenous vein unremarkable. No filling defects to suggest DVT on grayscale or color Doppler imaging. Doppler waveforms show normal direction of venous flow,  normal respiratory plasticity and response to augmentation. Limited views of the contralateral common femoral vein are unremarkable. OTHER Complex cystic area in the popliteal fossa measuring 4.5 x 1.2 x 4.7 cm Limitations: none IMPRESSION: 1. No deep venous thrombosis in the left lower extremity. 2. Complex popliteal cyst measuring up to 4.7 cm in the popliteal  fossa, likely a Baker cyst. Electronically Signed   By: Ulyses Jarred M.D.   On: 01/13/2021 03:46       Assessment & Plan:   Problem List Items Addressed This Visit     Essential hypertension    Blood pressure a little elevated today.  Add back hctz.  Continue bystolic.  Follow pressures.  Follow metabolic panel.  Check after being on hctz for a few weeks.        Relevant Medications   hydrochlorothiazide (MICROZIDE) 12.5 MG capsule   Other Relevant Orders   TSH   Basic metabolic panel   Hematoma    Noted under her chin - s/p fall.  Resolving.  No pain.  Smaller. Follow.  No problems eating or swallowing.  Also hematoma - anterior leg.  Increased pain leg and ankle.  Swelling and redness improved.  F/u with ortho as discussed.        Hyperlipidemia LDL goal <70    Crestor.   Low cholesterol diet and exercise.  Follow lipid panel and liver function tests.         Relevant Medications   hydrochlorothiazide (MICROZIDE) 12.5 MG capsule   Other Relevant Orders   Hepatic function panel   Lipid panel   Leg pain    Leg and ankle pain as outlined.  Decreased redness and swelling.  Hematoma should resolve.  Baker's cyst as outlined.  Given fall, persistent increased pain and complex cyst, I feel she needs orthopedic evaluation.  She is in agreement.  Will hold on xray. She will go today to [facility] walk in.  Further evaluation and w/up pending their assessment.  Pt comfortable with this plan.         Stress    Has done well on zoloft.  Follow.        Type 2 diabetes mellitus with hyperglycemia (HCC)    Low carb diet and exercise.  Follow met b and a1c.  Overdue labs.        Relevant Orders   Hemoglobin A1c     Einar Pheasant, MD

## 2021-01-18 ENCOUNTER — Encounter: Payer: Self-pay | Admitting: Internal Medicine

## 2021-01-18 DIAGNOSIS — T148XXA Other injury of unspecified body region, initial encounter: Secondary | ICD-10-CM | POA: Insufficient documentation

## 2021-01-18 DIAGNOSIS — M79606 Pain in leg, unspecified: Secondary | ICD-10-CM | POA: Insufficient documentation

## 2021-01-18 NOTE — Assessment & Plan Note (Signed)
Leg and ankle pain as outlined.  Decreased redness and swelling.  Hematoma should resolve.  Baker's cyst as outlined.  Given fall, persistent increased pain and complex cyst, I feel she needs orthopedic evaluation.  She is in agreement.  Will hold on xray. She will go today to [facility] walk in.  Further evaluation and w/up pending their assessment.  Pt comfortable with this plan.

## 2021-01-18 NOTE — Assessment & Plan Note (Signed)
Blood pressure a little elevated today.  Add back hctz.  Continue bystolic.  Follow pressures.  Follow metabolic panel.  Check after being on hctz for a few weeks.

## 2021-01-18 NOTE — Assessment & Plan Note (Signed)
Has done well on zoloft.  Follow.

## 2021-01-18 NOTE — Assessment & Plan Note (Signed)
Noted under her chin - s/p fall.  Resolving.  No pain.  Smaller. Follow.  No problems eating or swallowing.  Also hematoma - anterior leg.  Increased pain leg and ankle.  Swelling and redness improved.  F/u with ortho as discussed.

## 2021-01-18 NOTE — Assessment & Plan Note (Signed)
Low carb diet and exercise.  Follow met b and a1c.  Overdue labs.

## 2021-01-18 NOTE — Assessment & Plan Note (Addendum)
Crestor. Low cholesterol diet and exercise.  Follow lipid panel and liver function tests.  

## 2021-01-21 ENCOUNTER — Encounter: Payer: Self-pay | Admitting: Internal Medicine

## 2021-01-21 NOTE — Telephone Encounter (Signed)
LMTCB

## 2021-01-22 NOTE — Telephone Encounter (Signed)
Pt called and said her leg is better she has completed her abx. Confirmed no warmth, redness continuing to resolve, no pain. She has a friend who is a nurse that looked at her leg last night and says that everything looks good. She did note some diarrhea with the abx but she is taking probiotic. If diarrhea persists, she would like to be checked for c-diff. She had 2 loose stools yesterday and one this morning. Advised that she could let us know if this persists.

## 2021-01-30 ENCOUNTER — Other Ambulatory Visit: Payer: Self-pay

## 2021-01-30 ENCOUNTER — Other Ambulatory Visit (INDEPENDENT_AMBULATORY_CARE_PROVIDER_SITE_OTHER): Payer: No Typology Code available for payment source

## 2021-01-30 DIAGNOSIS — I10 Essential (primary) hypertension: Secondary | ICD-10-CM | POA: Diagnosis not present

## 2021-01-30 DIAGNOSIS — E785 Hyperlipidemia, unspecified: Secondary | ICD-10-CM

## 2021-01-30 DIAGNOSIS — E1165 Type 2 diabetes mellitus with hyperglycemia: Secondary | ICD-10-CM | POA: Diagnosis not present

## 2021-01-30 LAB — BASIC METABOLIC PANEL
BUN: 11 mg/dL (ref 6–23)
CO2: 28 mEq/L (ref 19–32)
Calcium: 9.3 mg/dL (ref 8.4–10.5)
Chloride: 104 mEq/L (ref 96–112)
Creatinine, Ser: 0.78 mg/dL (ref 0.40–1.20)
GFR: 80.65 mL/min (ref >60.00)
Glucose, Bld: 139 mg/dL — ABNORMAL HIGH (ref 70–99)
Potassium: 3.9 mEq/L (ref 3.5–5.1)
Sodium: 140 mEq/L (ref 135–145)

## 2021-01-30 LAB — HEPATIC FUNCTION PANEL
ALT: 14 U/L (ref 0–35)
AST: 14 U/L (ref 0–37)
Albumin: 4 g/dL (ref 3.5–5.2)
Alkaline Phosphatase: 53 U/L (ref 39–117)
Bilirubin, Direct: 0.1 mg/dL (ref 0.0–0.3)
Total Bilirubin: 0.6 mg/dL (ref 0.2–1.2)
Total Protein: 6.4 g/dL (ref 6.0–8.3)

## 2021-01-30 LAB — LIPID PANEL
Cholesterol: 205 mg/dL — ABNORMAL HIGH (ref 0–200)
HDL: 39 mg/dL — ABNORMAL LOW (ref >39.00)
NonHDL: 166.12
Total CHOL/HDL Ratio: 5
Triglycerides: 211 mg/dL — ABNORMAL HIGH (ref 0.0–149.0)
VLDL: 42.2 mg/dL — ABNORMAL HIGH (ref 0.0–40.0)

## 2021-01-30 LAB — TSH: TSH: 4.83 u[IU]/mL (ref 0.35–5.50)

## 2021-01-30 LAB — HEMOGLOBIN A1C: Hgb A1c MFr Bld: 6.7 % — ABNORMAL HIGH (ref 4.6–6.5)

## 2021-01-30 LAB — LDL CHOLESTEROL, DIRECT: Direct LDL: 132 mg/dL

## 2021-02-03 ENCOUNTER — Other Ambulatory Visit: Payer: Self-pay

## 2021-02-03 ENCOUNTER — Ambulatory Visit (INDEPENDENT_AMBULATORY_CARE_PROVIDER_SITE_OTHER): Payer: No Typology Code available for payment source | Admitting: Internal Medicine

## 2021-02-03 VITALS — BP 132/82 | HR 77 | Temp 96.6°F | Resp 16 | Ht 62.0 in | Wt 211.6 lb

## 2021-02-03 DIAGNOSIS — Z1231 Encounter for screening mammogram for malignant neoplasm of breast: Secondary | ICD-10-CM | POA: Diagnosis not present

## 2021-02-03 DIAGNOSIS — F439 Reaction to severe stress, unspecified: Secondary | ICD-10-CM

## 2021-02-03 DIAGNOSIS — I10 Essential (primary) hypertension: Secondary | ICD-10-CM | POA: Diagnosis not present

## 2021-02-03 DIAGNOSIS — Z0000 Encounter for general adult medical examination without abnormal findings: Secondary | ICD-10-CM

## 2021-02-03 DIAGNOSIS — T148XXA Other injury of unspecified body region, initial encounter: Secondary | ICD-10-CM

## 2021-02-03 DIAGNOSIS — E785 Hyperlipidemia, unspecified: Secondary | ICD-10-CM

## 2021-02-03 DIAGNOSIS — E1165 Type 2 diabetes mellitus with hyperglycemia: Secondary | ICD-10-CM

## 2021-02-03 LAB — HM DIABETES FOOT EXAM

## 2021-02-03 MED ORDER — SERTRALINE HCL 25 MG PO TABS
25.0000 mg | ORAL_TABLET | Freq: Every day | ORAL | 2 refills | Status: DC
  Filled 2021-02-03: qty 30, 30d supply, fill #0
  Filled 2021-05-15: qty 30, 30d supply, fill #1

## 2021-02-03 NOTE — Progress Notes (Signed)
Patient ID: ALLEYA DEMETER, female   DOB: Feb 11, 1956, 65 y.o.   MRN: [id number]   Subjective:    Patient ID: Loma Boston, female    DOB: 05/04/1956, 65 y.o.   MRN: [id number]  HPI This visit occurred during the SARS-CoV-2 public health emergency.  Safety protocols were in place, including screening questions prior to the visit, additional usage of staff PPE, and extensive cleaning of exam room while observing appropriate contact time as indicated for disinfecting solutions.   Patient here for a scheduled follow up.  Here to follow up regarding her diabetes, cholesterol and blood pressure.  Was evaluated recently after a fall.  Developed cellulitis.  Treated with abx.  Ultrasound negative for DVT.  Evaluated by ortho.  Is better now.  Bruining under chin - resolved and no residual problems.  No chest pain or sob.  No acid reflux reported.  No abdominal pain.  Bowels moving.  Leg is better.  Swelling/hematoma - resolved.  Cellulitis resolved.  She is feeling better.  Reviewed labs.  Sugars better.    Past Medical History:  Diagnosis Date   Abnormal echocardiogram 01/2009   Mild LVH, EF >55%, no regional wall motion abnormalities, normal RV, mild aortic insufficiency, no aortic stenosis.   Anxiety    Heart murmur    Hyperlipidemia    Hypertension    Edema with Norvasc. Unable to tolerate Lisinopril/ HCTZ.   Hypokalemia    Manifested by lip and finger tingling   Palpitations    Event monitor 01/2009 with no significant arrhythmias   TIA (transient ischemic attack) 01/2009   CT and MRI of head showed no evidence for stroke. Carotid dopplers showed no significant plaque   Ventral hernia    Past Surgical History:  Procedure Laterality Date   CRYOABLATION     Of uterus   UMBILICAL HERNIA REPAIR  2010   Dr Nicholes Stairs   URETHRAL DILATION     removal of bladder polyps   Family History  Problem Relation Age of Onset   Hypertension Mother    Arrhythmia Mother        Atrial fibrillation    Hypertension Father    Valvular heart disease Father    Hypertension Brother    Hypertension Paternal Grandmother    Coronary artery disease Neg Hx        Premature   Breast cancer Neg Hx    Colon cancer Neg Hx    Social History   Socioeconomic History   Marital status: Married    Spouse name: Not on file   Number of children: 2   Years of education: Not on file   Highest education level: Not on file  Occupational History   Occupation: Horticulturist, commercial    Comment: full time  Tobacco Use   Smoking status: Never   Smokeless tobacco: Never  Vaping Use   Vaping Use: Never used  Substance and Sexual Activity   Alcohol use: No    Alcohol/week: 0.0 standard drinks   Drug use: No   Sexual activity: Not on file  Other Topics Concern   Not on file  Social History Narrative   Married   Gets regular exercise   Social Determinants of Health   Financial Resource Strain: Not on file  Food Insecurity: Not on file  Transportation Needs: Not on file  Physical Activity: Not on file  Stress: Not on file  Social Connections: Not on file    Review of Systems  Constitutional:  Negative for appetite change and unexpected weight change.  HENT:  Negative for congestion and sinus pressure.   Respiratory:  Negative for cough, chest tightness and shortness of breath.   Cardiovascular:  Negative for chest pain and palpitations.       No increased leg swelling.    Gastrointestinal:  Negative for abdominal pain, diarrhea, nausea and vomiting.  Genitourinary:  Negative for difficulty urinating and dysuria.  Musculoskeletal:  Negative for joint swelling and myalgias.  Skin:  Negative for color change and rash.  Neurological:  Negative for dizziness, light-headedness and headaches.  Psychiatric/Behavioral:  Negative for agitation and dysphoric mood.       Objective:    Physical Exam Vitals reviewed.  Constitutional:      General: She is not in acute distress.    Appearance:  Normal appearance.  HENT:     Head: Normocephalic and atraumatic.     Right Ear: External ear normal.     Left Ear: External ear normal.  Eyes:     General: No scleral icterus.       Right eye: No discharge.        Left eye: No discharge.     Conjunctiva/sclera: Conjunctivae normal.  Neck:     Thyroid: No thyromegaly.  Cardiovascular:     Rate and Rhythm: Normal rate and regular rhythm.  Pulmonary:     Effort: No respiratory distress.     Breath sounds: Normal breath sounds. No wheezing.  Abdominal:     General: Bowel sounds are normal.     Palpations: Abdomen is soft.     Tenderness: There is no abdominal tenderness.  Musculoskeletal:        General: No swelling or tenderness.     Cervical back: Neck supple. No tenderness.  Lymphadenopathy:     Cervical: No cervical adenopathy.  Skin:    Findings: No erythema or rash.  Neurological:     Mental Status: She is alert.  Psychiatric:        Mood and Affect: Mood normal.        Behavior: Behavior normal.    BP 132/82   Pulse 77   Temp (!) 96.6 F (35.9 C)   Resp 16   Ht '5\' 2"'  (1.575 m)   Wt 211 lb 9.6 oz (96 kg)   SpO2 98%   BMI 38.70 kg/m  Wt Readings from Last 3 Encounters:  02/03/21 211 lb 9.6 oz (96 kg)  01/14/21 216 lb 12.8 oz (98.3 kg)  01/12/21 218 lb 4.1 oz (99 kg)    Outpatient Encounter Medications as of 02/03/2021  Medication Sig   aspirin 81 MG EC tablet Take 81 mg by mouth daily.     Blood Glucose Monitoring Suppl (FREESTYLE FREEDOM LITE) w/Device KIT USE AS DIRECTED TO CHECK BLOOD SUGARS 3 TO 4 TIMES DAILY   fish oil-omega-3 fatty acids 1000 MG capsule Take 0.75 capsules by mouth daily.     glucose blood test strip USE AS INSTRUCTED TO CHECK BLOOD SUGAR 3 TO 4 TIMES DAILY   hydrochlorothiazide (MICROZIDE) 12.5 MG capsule Take 1 capsule (12.5 mg total) by mouth daily.   Lancets (FREESTYLE) lancets USE AS DIRECTED TO CHECK BLOOD SUGARS 3 TO 4 TIMES DAILY   metFORMIN (GLUCOPHAGE) 500 MG tablet TAKE 1  TABLET (500 MG TOTAL) BY MOUTH 2 (TWO) TIMES DAILY WITH A MEAL.   nebivolol (BYSTOLIC) 2.5 MG tablet TAKE 1/2 TABLET BY MOUTH DAILY   rosuvastatin (CRESTOR) 10 MG  tablet Take 1 tablet (10 mg total) by mouth daily.   sertraline (ZOLOFT) 25 MG tablet Take 1 tablet (25 mg total) by mouth daily.   [DISCONTINUED] cephALEXin (KEFLEX) 500 MG capsule Take 1 capsule by mouth 3 (three) times daily.   [DISCONTINUED] COVID-19 mRNA vaccine, Pfizer, 30 MCG/0.3ML injection USE AS DIRECTED   [DISCONTINUED] HYDROcodone-acetaminophen (HYCET) 7.5-325 mg/15 ml solution Take 10 mLs by mouth every 6 (six) hours as needed for moderate pain.   [DISCONTINUED] meloxicam (MOBIC) 15 MG tablet Take 1 tablet every day by oral route.   [DISCONTINUED] Red Yeast Rice Extract (RED YEAST RICE PO) Take by mouth daily.   [DISCONTINUED] sertraline (ZOLOFT) 25 MG tablet TAKE 1 TABLET BY MOUTH TWICE DAILY   No facility-administered encounter medications on file as of 02/03/2021.     Lab Results  Component Value Date   WBC 7.1 01/12/2021   HGB 12.8 01/12/2021   HCT 37.2 01/12/2021   PLT 237 01/12/2021   GLUCOSE 139 (H) 01/30/2021   CHOL 205 (H) 01/30/2021   TRIG 211.0 (H) 01/30/2021   HDL 39.00 (L) 01/30/2021   LDLDIRECT 132.0 01/30/2021   LDLCALC 43 04/25/2020   ALT 14 01/30/2021   AST 14 01/30/2021   NA 140 01/30/2021   K 3.9 01/30/2021   CL 104 01/30/2021   CREATININE 0.78 01/30/2021   BUN 11 01/30/2021   CO2 28 01/30/2021   TSH 4.83 01/30/2021   HGBA1C 6.7 (H) 01/30/2021   MICROALBUR <0.7 12/12/2017    DG Tibia/Fibula Left  Result Date: 01/13/2021 CLINICAL DATA:  Fall, left leg pain EXAM: LEFT TIBIA AND FIBULA - 2 VIEW COMPARISON:  None. FINDINGS: Normal alignment. No fracture or dislocation. Incidental note is made of bicompartmental degenerative arthritis of the left knee involving the medial and patellofemoral compartments. Mild diffuse subcutaneous edema. IMPRESSION: No fracture or dislocation. Electronically  Signed   By: Fidela Salisbury MD   On: 01/13/2021 02:53   US Venous Img Lower Unilateral Left (DVT)  Result Date: 01/13/2021 CLINICAL DATA:  Left leg pain and redness EXAM: LEFT LOWER EXTREMITY VENOUS DOPPLER ULTRASOUND TECHNIQUE: Gray-scale sonography with compression, as well as color and duplex ultrasound, were performed to evaluate the deep venous system(s) from the level of the common femoral vein through the popliteal and proximal calf veins. COMPARISON:  None. FINDINGS: VENOUS Normal compressibility of the common femoral, superficial femoral, and popliteal veins, as well as the visualized calf veins. Visualized portions of profunda femoral vein and great saphenous vein unremarkable. No filling defects to suggest DVT on grayscale or color Doppler imaging. Doppler waveforms show normal direction of venous flow, normal respiratory plasticity and response to augmentation. Limited views of the contralateral common femoral vein are unremarkable. OTHER Complex cystic area in the popliteal fossa measuring 4.5 x 1.2 x 4.7 cm Limitations: none IMPRESSION: 1. No deep venous thrombosis in the left lower extremity. 2. Complex popliteal cyst measuring up to 4.7 cm in the popliteal fossa, likely a Baker cyst. Electronically Signed   By: Ulyses Jarred M.D.   On: 01/13/2021 03:46       Assessment & Plan:   Problem List Items Addressed This Visit     Breast cancer screening    Scheduled for 02/27/21.        Essential hypertension    Just restarted hctz.  On bystolic.  Recheck blood pressure improved.  Follow pressures.  Follow metabolic panel.  Spot check pressures.        Healthcare maintenance  Physical today 02/03/21.  Mammogram       Hematoma    Resolved.        Hyperlipidemia LDL goal <70    Crestor.   Low cholesterol diet and exercise.  Follow lipid panel and liver function tests.        Stress    Doing well on zoloft.  Follow.       Type 2 diabetes mellitus with hyperglycemia (HCC)     Low carb diet and exercise.  On metformin.  Recent a1c improved.  Follow met b and a1c.       Other Visit Diagnoses     Visit for screening mammogram    -  Primary   Relevant Orders   MM 3D SCREEN BREAST BILATERAL        Einar Pheasant, MD

## 2021-02-03 NOTE — Assessment & Plan Note (Signed)
Physical today 02/03/21.  Mammogram

## 2021-02-04 ENCOUNTER — Encounter: Payer: Self-pay | Admitting: Internal Medicine

## 2021-02-06 ENCOUNTER — Other Ambulatory Visit: Payer: Self-pay | Admitting: Internal Medicine

## 2021-02-06 ENCOUNTER — Other Ambulatory Visit: Payer: Self-pay

## 2021-02-08 ENCOUNTER — Encounter: Payer: Self-pay | Admitting: Internal Medicine

## 2021-02-08 DIAGNOSIS — Z1239 Encounter for other screening for malignant neoplasm of breast: Secondary | ICD-10-CM | POA: Insufficient documentation

## 2021-02-08 NOTE — Assessment & Plan Note (Signed)
Resolved

## 2021-02-08 NOTE — Assessment & Plan Note (Signed)
Crestor. Low cholesterol diet and exercise.  Follow lipid panel and liver function tests.  

## 2021-02-08 NOTE — Assessment & Plan Note (Signed)
Scheduled for 02/27/21.

## 2021-02-08 NOTE — Assessment & Plan Note (Signed)
Just restarted hctz.  On bystolic.  Recheck blood pressure improved.  Follow pressures.  Follow metabolic panel.  Spot check pressures.

## 2021-02-08 NOTE — Assessment & Plan Note (Signed)
Doing well on zoloft.  Follow.   

## 2021-02-08 NOTE — Assessment & Plan Note (Signed)
Low carb diet and exercise.  On metformin.  Recent a1c improved.  Follow met b and a1c.

## 2021-02-09 ENCOUNTER — Other Ambulatory Visit: Payer: Self-pay

## 2021-02-09 MED FILL — Nebivolol HCl Tab 2.5 MG (Base Equivalent): ORAL | 90 days supply | Qty: 45 | Fill #0 | Status: AC

## 2021-02-11 ENCOUNTER — Encounter: Payer: Self-pay | Admitting: Internal Medicine

## 2021-02-26 ENCOUNTER — Emergency Department: Payer: No Typology Code available for payment source

## 2021-02-26 ENCOUNTER — Other Ambulatory Visit: Payer: Self-pay

## 2021-02-26 ENCOUNTER — Emergency Department
Admission: EM | Admit: 2021-02-26 | Discharge: 2021-02-26 | Disposition: A | Discharge status: Home / Self Care | Payer: No Typology Code available for payment source | Attending: Emergency Medicine | Admitting: Emergency Medicine

## 2021-02-26 DIAGNOSIS — R4701 Aphasia: Secondary | ICD-10-CM | POA: Diagnosis not present

## 2021-02-26 DIAGNOSIS — I10 Essential (primary) hypertension: Secondary | ICD-10-CM | POA: Insufficient documentation

## 2021-02-26 DIAGNOSIS — Z7984 Long term (current) use of oral hypoglycemic drugs: Secondary | ICD-10-CM | POA: Insufficient documentation

## 2021-02-26 DIAGNOSIS — Z79899 Other long term (current) drug therapy: Secondary | ICD-10-CM | POA: Diagnosis not present

## 2021-02-26 DIAGNOSIS — Z7982 Long term (current) use of aspirin: Secondary | ICD-10-CM | POA: Insufficient documentation

## 2021-02-26 DIAGNOSIS — Z8679 Personal history of other diseases of the circulatory system: Secondary | ICD-10-CM | POA: Diagnosis not present

## 2021-02-26 DIAGNOSIS — R000 Tachycardia, unspecified: Secondary | ICD-10-CM | POA: Diagnosis present

## 2021-02-26 DIAGNOSIS — E119 Type 2 diabetes mellitus without complications: Secondary | ICD-10-CM | POA: Diagnosis not present

## 2021-02-26 LAB — CBC
HCT: 37.6 % (ref 36.0–46.0)
Hemoglobin: 13.1 g/dL (ref 12.0–15.0)
MCH: 33.1 pg (ref 26.0–34.0)
MCHC: 34.8 g/dL (ref 30.0–36.0)
MCV: 94.9 fL (ref 80.0–100.0)
Platelets: 239 10*3/uL (ref 150–400)
RBC: 3.96 MIL/uL (ref 3.87–5.11)
RDW: 12.9 % (ref 11.5–15.5)
WBC: 6.7 10*3/uL (ref 4.0–10.5)
nRBC: 0 % (ref 0.0–0.2)

## 2021-02-26 LAB — DIFFERENTIAL
Abs Immature Granulocytes: 0.03 10*3/uL (ref 0.00–0.07)
Basophils Absolute: 0.1 10*3/uL (ref 0.0–0.1)
Basophils Relative: 1 %
Eosinophils Absolute: 0.1 10*3/uL (ref 0.0–0.5)
Eosinophils Relative: 1 %
Immature Granulocytes: 0 %
Lymphocytes Relative: 17 %
Lymphs Abs: 1.1 10*3/uL (ref 0.7–4.0)
Monocytes Absolute: 0.4 10*3/uL (ref 0.1–1.0)
Monocytes Relative: 5 %
Neutro Abs: 5.1 10*3/uL (ref 1.7–7.7)
Neutrophils Relative %: 76 %

## 2021-02-26 LAB — COMPREHENSIVE METABOLIC PANEL
ALT: 17 U/L (ref 0–44)
AST: 22 U/L (ref 15–41)
Albumin: 4.2 g/dL (ref 3.5–5.0)
Alkaline Phosphatase: 51 U/L (ref 38–126)
Anion gap: 9 (ref 5–15)
BUN: 12 mg/dL (ref 8–23)
CO2: 26 mmol/L (ref 22–32)
Calcium: 9.1 mg/dL (ref 8.9–10.3)
Chloride: 101 mmol/L (ref 98–111)
Creatinine, Ser: 0.68 mg/dL (ref 0.44–1.00)
GFR, Estimated: >60 mL/min (ref >60)
Glucose, Bld: 159 mg/dL — ABNORMAL HIGH (ref 70–99)
Potassium: 4 mmol/L (ref 3.5–5.1)
Sodium: 136 mmol/L (ref 135–145)
Total Bilirubin: 0.6 mg/dL (ref 0.3–1.2)
Total Protein: 7 g/dL (ref 6.5–8.1)

## 2021-02-26 LAB — PROTIME-INR
INR: 1 (ref 0.8–1.2)
Prothrombin Time: 13.2 seconds (ref 11.4–15.2)

## 2021-02-26 LAB — TROPONIN I (HIGH SENSITIVITY): Troponin I (High Sensitivity): 5 ng/L (ref <18)

## 2021-02-26 LAB — APTT: aPTT: 28 seconds (ref 24–36)

## 2021-02-26 MED ORDER — LORAZEPAM 0.5 MG PO TABS
0.5000 mg | ORAL_TABLET | Freq: Once | ORAL | Status: DC | PRN
Start: 2021-02-26 — End: 2021-02-26

## 2021-02-26 NOTE — ED Triage Notes (Signed)
Pt to ED for possible CVA, states was having trouble with thinking of words to say and feeling weird feeling in chest with tingling all over around 0600 this am upon awakening. LKW 0100.  Denies pain at this time.  Clear speech. Ambulatory, able to move all extremities States unsure if she is having problems with aphaisa right now, just "cant recall words to say"  Hx TIA

## 2021-02-26 NOTE — ED Provider Notes (Signed)
[facility] Emergency Department Provider Note   ____________________________________________   Event Date/Time   First MD Initiated Contact with Patient 02/26/21 1149     (approximate)  I have reviewed the triage vital signs and the nursing notes.   HISTORY  Chief Complaint possible CVA    HPI Margaret Mercado is a 65 y.o. female who presents for hypertension, tachycardia, and aphasia  LOCATION: Generalized DURATION: Began this morning TIMING: Resolved since onset SEVERITY: Severe QUALITY: Aphasia, hypertension, tachycardia CONTEXT: Patient states that when she woke this morning she began feeling palpitations and took her blood pressure it was found to have systolics in the 735A and heart rate in the 130s with accompanying difficulty with word finding.  Patient states that the symptoms resolved as her blood pressure and heart rate improved. MODIFYING FACTORS: Improved after taking her regularly prescribed blood pressure medication and decrease in her blood pressure ASSOCIATED SYMPTOMS: Denies   Per medical record review, patient has history of aortic stenosis, hypertension          Past Medical History:  Diagnosis Date   Abnormal echocardiogram 01/2009   Mild LVH, EF >55%, no regional wall motion abnormalities, normal RV, mild aortic insufficiency, no aortic stenosis.   Anxiety    Heart murmur    Hyperlipidemia    Hypertension    Edema with Norvasc. Unable to tolerate Lisinopril/ HCTZ.   Hypokalemia    Manifested by lip and finger tingling   Palpitations    Event monitor 01/2009 with no significant arrhythmias   TIA (transient ischemic attack) 01/2009   CT and MRI of head showed no evidence for stroke. Carotid dopplers showed no significant plaque   Ventral hernia     Patient Active Problem List   Diagnosis Date Noted   Breast cancer screening 02/08/2021   Hematoma 01/18/2021   Leg pain 01/18/2021   Type 2 diabetes mellitus with  hyperglycemia ([place]) 05/27/2020   Tricuspid valve insufficiency 03/28/2019   Heart murmur 11/08/2018   History of TIA (transient ischemic attack) 11/08/2018   Abnormal mammogram of right breast 05/03/2018   DOE (dyspnea on exertion) 12/13/2017   Stress 12/13/2017   Hyperglycemia 12/12/2017   Healthcare maintenance 05/17/2016   Abdominal pain, left lower quadrant 08/19/2013   Ventral hernia 08/19/2013   Pyuria 08/19/2013   Sleep apnea 06/17/2012   FLUSHING 02/14/2009   Hyperlipidemia LDL goal <70 01/30/2009   Essential hypertension 01/30/2009   CARDIAC ARRHYTHMIA 01/30/2009   Transient cerebral ischemia 01/30/2009    Past Surgical History:  Procedure Laterality Date   CRYOABLATION     Of uterus   UMBILICAL HERNIA REPAIR  2010   Dr Nicholes Stairs   URETHRAL DILATION     removal of bladder polyps    Prior to Admission medications   Medication Sig Start Date End Date Taking? Authorizing Provider  aspirin 81 MG EC tablet Take 81 mg by mouth daily.      [provider]  Blood Glucose Monitoring Suppl (FREESTYLE FREEDOM LITE) w/Device KIT USE AS DIRECTED TO CHECK BLOOD SUGARS 3 TO 4 TIMES DAILY 05/27/20 05/27/21  Einar Pheasant, MD  fish oil-omega-3 fatty acids 1000 MG capsule Take 0.75 capsules by mouth daily.      [provider]  glucose blood test strip USE AS INSTRUCTED TO CHECK BLOOD SUGAR 3 TO 4 TIMES DAILY 05/27/20 05/27/21  Einar Pheasant, MD  hydrochlorothiazide (MICROZIDE) 12.5 MG capsule Take 1 capsule (12.5 mg total) by mouth daily. 01/14/21  Einar Pheasant, MD  Lancets (FREESTYLE) lancets USE AS DIRECTED TO CHECK BLOOD SUGARS 3 TO 4 TIMES DAILY 05/27/20 05/27/21  Einar Pheasant, MD  metFORMIN (GLUCOPHAGE) 500 MG tablet TAKE 1 TABLET (500 MG TOTAL) BY MOUTH 2 (TWO) TIMES DAILY WITH A MEAL. 04/29/20 04/29/21  Einar Pheasant, MD  nebivolol (BYSTOLIC) 2.5 MG tablet TAKE 1/2 TABLET BY MOUTH DAILY 02/09/21   Einar Pheasant, MD  rosuvastatin (CRESTOR) 10 MG  tablet Take 1 tablet (10 mg total) by mouth daily. 04/25/19   Einar Pheasant, MD  sertraline (ZOLOFT) 25 MG tablet Take 1 tablet (25 mg total) by mouth daily. 02/03/21   Einar Pheasant, MD    Allergies Vicodin [hydrocodone-acetaminophen], Amlodipine besylate, Cardizem [diltiazem hcl], Erythromycin, Hydrocodone-acetaminophen, Levaquin [levofloxacin in d5w], Lisinopril, Macrobid [nitrofurantoin macrocrystal], Oxycodone-acetaminophen, Penicillins, and Sulfonamide derivatives  Family History  Problem Relation Age of Onset   Hypertension Mother    Arrhythmia Mother        Atrial fibrillation   Hypertension Father    Valvular heart disease Father    Hypertension Brother    Hypertension Paternal Grandmother    Coronary artery disease Neg Hx        Premature   Breast cancer Neg Hx    Colon cancer Neg Hx     Social History Social History   Tobacco Use   Smoking status: Never   Smokeless tobacco: Never  Vaping Use   Vaping Use: Never used  Substance Use Topics   Alcohol use: No    Alcohol/week: 0.0 standard drinks   Drug use: No    Review of Systems Constitutional: No fever/chills Eyes: No visual changes. ENT: No sore throat. Cardiovascular: Denies chest pain. Respiratory: Denies shortness of breath. Gastrointestinal: No abdominal pain.  No nausea, no vomiting.  No diarrhea. Genitourinary: Negative for dysuria. Musculoskeletal: Negative for acute arthralgias Skin: Negative for rash. Neurological: Positive for aphasia.  Negative for headaches, weakness/numbness/paresthesias in any extremity Psychiatric: Negative for suicidal ideation/homicidal ideation   ____________________________________________   PHYSICAL EXAM:  VITAL SIGNS: ED Triage Vitals  Enc Vitals Group     BP 02/26/21 1023 (!) 187/71     Pulse Rate 02/26/21 1023 80     Resp 02/26/21 1023 20     Temp 02/26/21 1023 98.3 F (36.8 C)     Temp Source 02/26/21 1023 Oral     SpO2 02/26/21 1023 96 %      Weight 02/26/21 1026 202 lb (91.6 kg)     Height 02/26/21 1026 _0  (1.549 m)     Head Circumference --      Peak Flow --      Pain Score 02/26/21 1207 0     Pain Loc --      Pain Edu? --      Excl. in [place]? --    Constitutional: Alert and oriented. Well appearing and in no acute distress. Eyes: Conjunctivae are normal. PERRL. Head: Atraumatic. Nose: No congestion/rhinnorhea. Mouth/Throat: Mucous membranes are moist. Neck: No stridor Cardiovascular: Grossly normal heart sounds.  Good peripheral circulation. Respiratory: Normal respiratory effort.  No retractions. Gastrointestinal: Soft and nontender. No distention. Musculoskeletal: No obvious deformities Neurologic:  Normal speech and language. No gross focal neurologic deficits are appreciated. Skin:  Skin is warm and dry. No rash noted. Psychiatric: Mood and affect are normal. Speech and behavior are normal.  ____________________________________________   LABS (all labs ordered are listed, but only abnormal results are displayed)  Labs Reviewed  COMPREHENSIVE METABOLIC PANEL - Abnormal; Notable  for the following components:      Result Value   Glucose, Bld 159 (*)    All other components within normal limits  PROTIME-INR  APTT  CBC  DIFFERENTIAL  TROPONIN I (HIGH SENSITIVITY)  TROPONIN I (HIGH SENSITIVITY)   ____________________________________________  EKG  ED ECG REPORT I, Naaman Plummer, the attending physician, personally viewed and interpreted this ECG.  Date: 02/26/2021 EKG Time: 1035 Rate: 86 Rhythm: normal sinus rhythm QRS Axis: normal Intervals: normal ST/T Wave abnormalities: normal Narrative Interpretation: no evidence of acute ischemia  ____________________________________________  RADIOLOGY  ED MD interpretation: CT of the head without contrast shows no evidence of acute abnormalities including no intracerebral hemorrhage, obvious masses, or significant edema  MRI of the brain without  contrast using neuro protocol shows no acute or reversible finding as well as no sign of previous ischemic brain insult  Official radiology report(s): CT HEAD WO CONTRAST  Result Date: 02/26/2021 CLINICAL DATA:  Mental status changes. EXAM: CT HEAD WITHOUT CONTRAST TECHNIQUE: Contiguous axial images were obtained from the base of the skull through the vertex without intravenous contrast. COMPARISON:  03/04/2009 FINDINGS: Brain: There is no evidence for acute hemorrhage, hydrocephalus, mass lesion, or abnormal extra-axial fluid collection. No definite CT evidence for acute infarction. Diffuse loss of parenchymal volume is consistent with atrophy. Vascular: No hyperdense vessel or unexpected calcification. Skull: No evidence for fracture. No worrisome lytic or sclerotic lesion. Sinuses/Orbits: The visualized paranasal sinuses and mastoid air cells are clear. Visualized portions of the globes and intraorbital fat are unremarkable. Other: None. IMPRESSION: 1. No acute intracranial abnormality. 2. Atrophy. Electronically Signed   By: Misty Stanley M.D.   On: 02/26/2021 11:41   MR Brain Wo Contrast (neuro protocol)  Result Date: 02/26/2021 CLINICAL DATA:  Neurological deficit, acute, stroke suspected. Aphasia. EXAM: MRI HEAD WITHOUT CONTRAST TECHNIQUE: Multiplanar, multiecho pulse sequences of the brain and surrounding structures were obtained without intravenous contrast. COMPARISON:  Head CT earlier same day FINDINGS: Brain: Diffusion imaging does not show any acute or subacute infarction. There is brain atrophy, frontal lobe predominant. No evidence of mass lesion, hemorrhage, hydrocephalus or extra-axial collection. Vascular: Major vessels at the base of the brain show flow. Skull and upper cervical spine: Negative Sinuses/Orbits: Clear/normal Other: None IMPRESSION: No acute or reversible finding. No sign of previous ischemic brain insult. Brain atrophy, frontal lobe predominant. Electronically Signed   By:  Nelson Chimes M.D.   On: 02/26/2021 13:57    ____________________________________________   PROCEDURES  Procedure(s) performed (including Critical Care):  .1-3 Lead EKG Interpretation Performed by: Naaman Plummer, MD Authorized by: Naaman Plummer, MD     Interpretation: normal     ECG rate:  70   ECG rate assessment: normal     Rhythm: sinus rhythm     Ectopy: none     Conduction: normal     ____________________________________________   INITIAL IMPRESSION / ASSESSMENT AND PLAN / ED COURSE  As part of my medical decision making, I reviewed the following data within the electronic medical record, if available:  Nursing notes reviewed and incorporated, Labs reviewed, EKG interpreted, Old chart reviewed, Radiograph reviewed and Notes from prior ED visits reviewed and incorporated     Presents to the emergency department complaining of high blood pressure. Patient is otherwise asymptomatic without confusion, chest pain, hematuria, or SOB. Denies nonadherence to antihypertensive regimen DDx: CV, AMI, heart failure, renal infarction or failure or other end organ damage.  Disposition: Discussed with patient  their elevated blood pressure and need for close outpatient management of their hypertension. Will arrange for the patient to follow up in a primary care clinic     ____________________________________________   FINAL CLINICAL IMPRESSION(S) / ED DIAGNOSES  Final diagnoses:  Primary hypertension  Aphasia  Sinus tachycardia     ED Discharge Orders     None        Note:  This document was prepared using Dragon voice recognition software and may include unintentional dictation errors.    Naaman Plummer, MD 02/26/21 [phone]

## 2021-03-20 ENCOUNTER — Other Ambulatory Visit: Payer: Self-pay

## 2021-03-20 MED ORDER — FLUARIX QUADRIVALENT 0.5 ML IM SUSY
PREFILLED_SYRINGE | INTRAMUSCULAR | 0 refills | Status: DC
  Filled 2021-03-20: qty 0.5, 1d supply, fill #0

## 2021-04-07 ENCOUNTER — Other Ambulatory Visit: Payer: Self-pay

## 2021-04-14 ENCOUNTER — Other Ambulatory Visit: Payer: Self-pay

## 2021-04-14 MED ORDER — IBUPROFEN 600 MG PO TABS
ORAL_TABLET | ORAL | 0 refills | Status: DC
  Filled 2021-04-14: qty 18, 6d supply, fill #0

## 2021-05-15 ENCOUNTER — Other Ambulatory Visit: Payer: Self-pay

## 2021-05-15 MED FILL — Nebivolol HCl Tab 2.5 MG (Base Equivalent): ORAL | 90 days supply | Qty: 45 | Fill #1 | Status: AC

## 2021-05-18 ENCOUNTER — Other Ambulatory Visit: Payer: Self-pay

## 2021-05-19 ENCOUNTER — Other Ambulatory Visit: Payer: Self-pay

## 2021-06-02 ENCOUNTER — Encounter: Payer: Self-pay | Admitting: Internal Medicine

## 2021-06-02 ENCOUNTER — Telehealth: Payer: Self-pay

## 2021-06-02 NOTE — Telephone Encounter (Signed)
Confirmed no sob, chest tightness, etc. Scheduled for virtual visit tomorrow at 4:30

## 2021-06-02 NOTE — Telephone Encounter (Signed)
I am in a difficult spot husband +covid and fell broke his hip in [facility] positive. I have it now and cant afford to get sicker, this is my 3 day could I get a script for antivirals? [phone]

## 2021-06-03 ENCOUNTER — Other Ambulatory Visit: Payer: Self-pay

## 2021-06-03 ENCOUNTER — Telehealth (INDEPENDENT_AMBULATORY_CARE_PROVIDER_SITE_OTHER): Payer: No Typology Code available for payment source | Admitting: Adult Health

## 2021-06-03 ENCOUNTER — Encounter: Payer: Self-pay | Admitting: Adult Health

## 2021-06-03 VITALS — Ht 60.98 in | Wt 196.0 lb

## 2021-06-03 DIAGNOSIS — J069 Acute upper respiratory infection, unspecified: Secondary | ICD-10-CM | POA: Diagnosis not present

## 2021-06-03 DIAGNOSIS — U071 COVID-19: Secondary | ICD-10-CM | POA: Diagnosis not present

## 2021-06-03 MED ORDER — MOLNUPIRAVIR EUA 200MG CAPSULE
4.0000 | ORAL_CAPSULE | Freq: Two times a day (BID) | ORAL | 0 refills | Status: AC
  Filled 2021-06-03: qty 40, 5d supply, fill #0

## 2021-06-03 NOTE — Progress Notes (Signed)
Virtual Visit via Video Note  I connected with Loma Boston on 06/08/21 at  4:30 PM EST by a video enabled telemedicine application and verified that I am speaking with the correct person using two identifiers.  Location: Patient: at home  Provider: Provider: Provider's office at  [facility], [place].      I discussed the limitations of evaluation and management by telemedicine and the availability of in person appointments. The patient expressed understanding and agreed to proceed.  History of Present Illness: Patient is a 65 year old female in no acute distress who comes to the clinic by virtual visit. She tested positive for covid.   She has a scratchy throat, nasal congestion, loss of taste and smell, headache, fever, mild nausea, drinking and eating small amount.  100.5 temperature.   Denies any history of pneumonia in past.  Denies any recent illness.  Denies any other known exposures.   Her husband is hospitalized with a fractured hip and is positive for covid in hospital now.  She wants a script for antivirals. He is in covid unit.    Patient  denies any fever, body aches,chills, rash, chest pain, shortness of breath, nausea, vomiting, or diarrhea.  Denies dizziness, lightheadedness, pre syncopal or syncopal episodes.   Observations/Objective:   Patient is alert and oriented and responsive to questions Engages in conversation with provider. Speaks in full sentences without any pauses without any shortness of breath or distress.   Assessment and Plan: COVID - Plan: molnupiravir EUA (LAGEVRIO) 200 mg CAPS capsule  Upper respiratory tract infection, unspecified type - Plan: molnupiravir EUA (LAGEVRIO) 200 mg CAPS capsule  Discussed medication above and also the emergency use authorization for covid. Side effects. Patiet desired treatment with antivirals.   Advised in person evaluation at anytime is advised if any symptoms do not  improve, worsen or change at any given time.  Red Flags discussed. The patient was given clear instructions to go to ER or return to medical center if any red flags develop, symptoms do not improve, worsen or new problems develop. They verbalized understanding.   Follow Up Instructions:    I discussed the assessment and treatment plan with the patient. The patient was provided an opportunity to ask questions and all were answered. The patient agreed with the plan and demonstrated an understanding of the instructions.   The patient was advised to call back or seek an in-person evaluation if the symptoms worsen or if the condition fails to improve as anticipated.     Marcille Buffy, FNP

## 2021-06-03 NOTE — Patient Instructions (Addendum)
Molnupiravir Oral Capsules What is this medication? MOLNUPIRAVIR (mol nue pir a vir) treats COVID-19. It is an antiviral medication. It may decrease the risk of developing severe symptoms of COVID-19. It may also decrease the chance of going to the hospital. This medication is not approved by the FDA. The FDA has authorized emergency use of this medication during the COVID-19 pandemic. This medicine may be used for other purposes; ask your health care provider or pharmacist if you have questions. COMMON BRAND NAME(S): LAGEVRIO What should I tell my care team before I take this medication? They need to know if you have any of these conditions: Any allergies Any serious illness An unusual or allergic reaction to molnupiravir, other medications, foods, dyes, or preservatives Pregnant or trying to get pregnant Breast-feeding How should I use this medication? Take this medication by mouth with water. Take it as directed on the prescription label at the same time every day. Do not cut, crush or chew this medication. Swallow the capsules whole. You can take it with or without food. If it upsets your stomach, take it with food. Take all of this medication unless your care team tells you to stop it early. Keep taking it even if you think you are better. Talk to your care team about the use of this medication in children. Special care may be needed. Overdosage: If you think you have taken too much of this medicine contact a poison control center or emergency room at once. NOTE: This medicine is only for you. Do not share this medicine with others. What if I miss a dose? If you miss a dose, take it as soon as you can unless it is more than 10 hours late. If it is more than 10 hours late, skip the missed dose. Take the next dose at the normal time. Do not take extra or 2 doses at the same time to make up for the missed dose. What may interact with this medication? Interactions have not been studied. This  list may not describe all possible interactions. Give your health care provider a list of all the medicines, herbs, non-prescription drugs, or dietary supplements you use. Also tell them if you smoke, drink alcohol, or use illegal drugs. Some items may interact with your medicine. What should I watch for while using this medication? Your condition will be monitored carefully while you are receiving this medication. Visit your care team for regular checkups. Tell your care team if your symptoms do not start to get better or if they get worse. Do not become pregnant while taking this medication. You may need a pregnancy test before starting this medication. Women must use a reliable form of birth control while taking this medication and for 4 days after stopping the medication. Women should inform their care team if they wish to become pregnant or think they might be pregnant. Men should not father a child while taking this medication and for 3 months after stopping it. There is potential for serious harm to an unborn child. Talk to your care team for more information. Do not breast-feed an infant while taking this medication and for 4 days after stopping the medication. What side effects may I notice from receiving this medication? Side effects that you should report to your care team as soon as possible: Allergic reactions--skin rash, itching, hives, swelling of the face, lips, tongue, or throat Side effects that usually do not require medical attention (report these to your care team if they  continue or are bothersome): Diarrhea Dizziness Nausea This list may not describe all possible side effects. Call your doctor for medical advice about side effects. You may report side effects to FDA at 1-800-FDA-1088. Where should I keep my medication? Keep out of the reach of children and pets. Store at room temperature between 20 and 25 degrees C (68 and 77 degrees F). Get rid of any unused medication after the  expiration date. To get rid of medications that are no longer needed or have expired: Take the medication to a medication take-back program. Check with your pharmacy or law enforcement to find a location. If you cannot return the medication, check the label or package insert to see if the medication should be thrown out in the garbage or flushed down the toilet. If you are not sure, ask your care team. If it is safe to put it in the trash, take the medication out of the container. Mix the medication with cat litter, dirt, coffee grounds, or other unwanted substance. Seal the mixture in a bag or container. Put it in the trash. NOTE: This sheet is a summary. It may not cover all possible information. If you have questions about this medicine, talk to your doctor, pharmacist, or health care provider.  2022 Elsevier/Gold Standard (2020-06-09 00:00:00) Upper Respiratory Infection, Adult An upper respiratory infection (URI) affects the nose, throat, and upper airways that lead to the lungs. The most common type of URI is often called the common cold. URIs usually get better on their own, without medical treatment. What are the causes? A URI is caused by a germ (virus). You may catch these germs by: Breathing in droplets from an infected person's cough or sneeze. Touching something that has the germ on it (is contaminated) and then touching your mouth, nose, or eyes. What increases the risk? You are more likely to get a URI if: You are very young or very old. You have close contact with others, such as at work, school, or a health care facility. You smoke. You have long-term (chronic) heart or lung disease. You have a weakened disease-fighting system (immune system). You have nasal allergies or asthma. You have a lot of stress. You have poor nutrition. What are the signs or symptoms? Runny or stuffy (congested) nose. Cough. Sneezing. Sore throat. Headache. Feeling tired (fatigue). Fever. Not  wanting to eat as much as usual. Pain in your forehead, behind your eyes, and over your cheekbones (sinus pain). Muscle aches. Redness or irritation of the eyes. Pressure in the ears or face. How is this treated? URIs usually get better on their own within 7-10 days. Medicines cannot cure URIs, but your doctor may recommend certain medicines to help relieve symptoms, such as: Over-the-counter cold medicines. Medicines to reduce coughing (cough suppressants). Coughing is a type of defense against infection that helps to clear the nose, throat, windpipe, and lungs (respiratory system). Take these medicines only as told by your doctor. Medicines to lower your fever. Follow these instructions at home: Activity Rest as needed. If you have a fever, stay home from work or school until your fever is gone, or until your doctor says you may return to work or school. You should stay home until you cannot spread the infection anymore (you are not contagious). Your doctor may have you wear a face mask so you have less risk of spreading the infection. Relieving symptoms Rinse your mouth often with salt water. To make salt water, dissolve -1 tsp (3-6 g)  of salt in 1 cup (237 mL) of warm water. Use a cool-mist humidifier to add moisture to the air. This can help you breathe more easily. Eating and drinking  Drink enough fluid to keep your pee (urine) pale yellow. Eat soups and other clear broths. General instructions  Take over-the-counter and prescription medicines only as told by your doctor. Do not smoke or use any products that contain nicotine or tobacco. If you need help quitting, ask your doctor. Avoid being where people are smoking (avoid secondhand smoke). Stay up to date on all your shots (immunizations), and get the flu shot every year. Keep all follow-up visits. How to prevent the spread of infection to others  Wash your hands with soap and water for at least 20 seconds. If you cannot  use soap and water, use hand sanitizer. Avoid touching your mouth, face, eyes, or nose. Cough or sneeze into a tissue or your sleeve or elbow. Do not cough or sneeze into your hand or into the air. Contact a doctor if: You are getting worse, not better. You have any of these: A fever or chills. Brown or red mucus in your nose. Yellow or brown fluid (discharge)coming from your nose. Pain in your face, especially when you bend forward. Swollen neck glands. Pain when you swallow. White areas in the back of your throat. Get help right away if: You have shortness of breath that gets worse. You have very bad or constant: Headache. Ear pain. Pain in your forehead, behind your eyes, and over your cheekbones (sinus pain). Chest pain. You have long-lasting (chronic) lung disease along with any of these: Making high-pitched whistling sounds when you breathe, most often when you breathe out (wheezing). Long-lasting cough (more than 14 days). Coughing up blood. A change in your usual mucus. You have a stiff neck. You have changes in your: Vision. Hearing. Thinking. Mood. These symptoms may be an emergency. Get help right away. Call 911. Do not wait to see if the symptoms will go away. Do not drive yourself to the hospital. Summary An upper respiratory infection (URI) is caused by a germ (virus). The most common type of URI is often called the common cold. URIs usually get better within 7-10 days. Take over-the-counter and prescription medicines only as told by your doctor. This information is not intended to replace advice given to you by your health care provider. Make sure you discuss any questions you have with your health care provider. Document Revised: 12/31/2020 Document Reviewed: 12/31/2020 Elsevier Patient Education  [place] Can Do to Manage Your COVID-19 Symptoms at Home If you have possible or confirmed COVID-19 Stay home except to get medical  care. Monitor your symptoms carefully. If your symptoms get worse, call your healthcare provider immediately. Get rest and stay hydrated. If you have a medical appointment, call the healthcare provider ahead of time and tell them that you have or may have COVID-19. For medical emergencies, call 911 and notify the dispatch personnel that you have or may have COVID-19. Cover your cough and sneezes with a tissue or use the inside of your elbow. Wash your hands often with soap and water for at least 20 seconds or clean your hands with an alcohol-based hand sanitizer that contains at least 60% alcohol. As much as possible, stay in a specific room and away from other people in your home. Also, you should use a separate bathroom, if available. If you need to be around other people  in or outside of the home, wear a mask. Avoid sharing personal items with other people in your household, like dishes, towels, and bedding. Clean all surfaces that are touched often, like counters, tabletops, and doorknobs. Use household cleaning sprays or wipes according to the label instructions. [email or web address] 12/28/2019 This information is not intended to replace advice given to you by your health care provider. Make sure you discuss any questions you have with your health care provider. Document Revised: 02/20/2021 Document Reviewed: 02/20/2021 Elsevier Patient Education  [place].

## 2021-06-08 ENCOUNTER — Encounter: Payer: Self-pay | Admitting: Adult Health

## 2021-06-09 ENCOUNTER — Other Ambulatory Visit: Payer: Self-pay

## 2021-06-30 ENCOUNTER — Encounter: Payer: No Typology Code available for payment source | Admitting: Internal Medicine

## 2021-08-14 ENCOUNTER — Encounter: Payer: Self-pay | Admitting: Internal Medicine

## 2021-08-14 ENCOUNTER — Other Ambulatory Visit: Payer: Self-pay | Admitting: Internal Medicine

## 2021-08-14 ENCOUNTER — Other Ambulatory Visit: Payer: Self-pay

## 2021-08-14 MED ORDER — NEBIVOLOL HCL 2.5 MG PO TABS
ORAL_TABLET | ORAL | 0 refills | Status: DC
  Filled 2021-08-14: qty 14, 28d supply, fill #0

## 2021-09-11 ENCOUNTER — Other Ambulatory Visit: Payer: Self-pay

## 2021-09-11 ENCOUNTER — Ambulatory Visit (INDEPENDENT_AMBULATORY_CARE_PROVIDER_SITE_OTHER): Payer: No Typology Code available for payment source | Admitting: Internal Medicine

## 2021-09-11 VITALS — BP 130/70 | HR 64 | Temp 98.0°F | Ht 60.0 in | Wt 204.2 lb

## 2021-09-11 DIAGNOSIS — E785 Hyperlipidemia, unspecified: Secondary | ICD-10-CM

## 2021-09-11 DIAGNOSIS — I10 Essential (primary) hypertension: Secondary | ICD-10-CM

## 2021-09-11 DIAGNOSIS — K439 Ventral hernia without obstruction or gangrene: Secondary | ICD-10-CM

## 2021-09-11 DIAGNOSIS — R4701 Aphasia: Secondary | ICD-10-CM

## 2021-09-11 DIAGNOSIS — R739 Hyperglycemia, unspecified: Secondary | ICD-10-CM

## 2021-09-11 DIAGNOSIS — E1165 Type 2 diabetes mellitus with hyperglycemia: Secondary | ICD-10-CM

## 2021-09-11 DIAGNOSIS — F439 Reaction to severe stress, unspecified: Secondary | ICD-10-CM

## 2021-09-11 DIAGNOSIS — R000 Tachycardia, unspecified: Secondary | ICD-10-CM

## 2021-09-11 MED ORDER — SERTRALINE HCL 25 MG PO TABS
25.0000 mg | ORAL_TABLET | Freq: Every day | ORAL | 2 refills | Status: DC
  Filled 2021-09-11: qty 30, 30d supply, fill #0
  Filled 2021-10-13: qty 30, 30d supply, fill #1
  Filled 2021-11-02: qty 30, 30d supply, fill #2

## 2021-09-11 MED ORDER — NEBIVOLOL HCL 2.5 MG PO TABS
ORAL_TABLET | ORAL | 1 refills | Status: DC
  Filled 2021-09-11: qty 45, 90d supply, fill #0
  Filled 2021-11-23: qty 45, 90d supply, fill #1

## 2021-09-11 NOTE — Progress Notes (Signed)
Patient ID: Margaret Mercado, female   DOB: 1955-07-29, 66 y.o.   MRN: [id number] ? ? ?Subjective:  ? ? Patient ID: Margaret Mercado, female    DOB: January 29, 1956, 66 y.o.   MRN: [id number] ? ?This visit occurred during the SARS-CoV-2 public health emergency.  Safety protocols were in place, including screening questions prior to the visit, additional usage of staff PPE, and extensive cleaning of exam room while observing appropriate contact time as indicated for disinfecting solutions.  ? ?Patient here for her physical exam.  ? ?Chief Complaint  ?Patient presents with  ? Follow-up  ?  F/u for DM. Pt c/o hernia causing pain. Pt states she can feel it now close to the surface of abdomen and it is causing her discomfort.   ? .  ? ?HPI ?Reports increased problems with hernia.  Larger - bulging more at times.  Some discomfort.  Discussed referral for evaluation.  Was recently evaluated in ER (02/2021) - expressive aphasia and elevated blood pressure and heart rated.  Noticed increased heart rate and had trouble getting her words out.  Work up unrevealing in ER.  No other episodes since her ER visit.  On bystolic now.  Did not take full dose yesterday.  No chest pain.  Breathing stable.  Does describe the episodes of increased heart rate as outlined.  Bowels moving.  No nausea or vomiting.   ? ? ?Past Medical History:  ?Diagnosis Date  ? Abnormal echocardiogram 01/2009  ? Mild LVH, EF >55%, no regional wall motion abnormalities, normal RV, mild aortic insufficiency, no aortic stenosis.  ? Anxiety   ? Heart murmur   ? Hyperlipidemia   ? Hypertension   ? Edema with Norvasc. Unable to tolerate Lisinopril/ HCTZ.  ? Hypokalemia   ? Manifested by lip and finger tingling  ? Palpitations   ? Event monitor 01/2009 with no significant arrhythmias  ? TIA (transient ischemic attack) 01/2009  ? CT and MRI of head showed no evidence for stroke. Carotid dopplers showed no significant plaque  ? Ventral hernia   ? ?Past Surgical History:   ?Procedure Laterality Date  ? CRYOABLATION    ? Of uterus  ? UMBILICAL HERNIA REPAIR  2010  ? Dr Nicholes Stairs  ? URETHRAL DILATION    ? removal of bladder polyps  ? ?Family History  ?Problem Relation Age of Onset  ? Hypertension Mother   ? Arrhythmia Mother   ?     Atrial fibrillation  ? Hypertension Father   ? Valvular heart disease Father   ? Hypertension Brother   ? Hypertension Paternal Grandmother   ? Coronary artery disease Neg Hx   ?     Premature  ? Breast cancer Neg Hx   ? Colon cancer Neg Hx   ? ?Social History  ? ?Socioeconomic History  ? Marital status: Married  ?  Spouse name: Not on file  ? Number of children: 2  ? Years of education: Not on file  ? Highest education level: Not on file  ?Occupational History  ? Occupation: Horticulturist, commercial  ?  Comment: full time  ?Tobacco Use  ? Smoking status: Never  ? Smokeless tobacco: Never  ?Vaping Use  ? Vaping Use: Never used  ?Substance and Sexual Activity  ? Alcohol use: No  ?  Alcohol/week: 0.0 standard drinks  ? Drug use: No  ? Sexual activity: Not on file  ?Other Topics Concern  ? Not on file  ?Social History Narrative  ?  Married  ? Gets regular exercise  ? ?Social Determinants of Health  ? ?Financial Resource Strain: Not on file  ?Food Insecurity: Not on file  ?Transportation Needs: Not on file  ?Physical Activity: Not on file  ?Stress: Not on file  ?Social Connections: Not on file  ? ? ? ?Review of Systems  ?Constitutional:  Negative for appetite change and unexpected weight change.  ?HENT:  Negative for congestion and sinus pressure.   ?Respiratory:  Negative for cough, chest tightness and shortness of breath.   ?Cardiovascular:  Negative for chest pain and leg swelling.  ?     Increased heart rate as outlined.   ?Gastrointestinal:  Negative for abdominal pain, diarrhea, nausea and vomiting.  ?Genitourinary:  Negative for difficulty urinating and dysuria.  ?Musculoskeletal:  Negative for joint swelling and myalgias.  ?Skin:  Negative for color change and  rash.  ?Neurological:  Negative for dizziness, light-headedness and headaches.  ?Psychiatric/Behavioral:  Negative for agitation and dysphoric mood.   ? ?   ?Objective:  ?  ? ?BP 130/70 (BP Location: Left Arm, Patient Position: Sitting, Cuff Size: Large)   Pulse 64   Temp 98 ?F (36.7 ?C)   Ht 5' (1.524 m)   Wt 204 lb 3.2 oz (92.6 kg)   SpO2 99%   BMI 39.88 kg/m?  ?Wt Readings from Last 3 Encounters:  ?09/11/21 204 lb 3.2 oz (92.6 kg)  ?06/03/21 196 lb (88.9 kg)  ?02/26/21 202 lb (91.6 kg)  ? ? ?Physical Exam ?Vitals reviewed.  ?Constitutional:   ?   General: She is not in acute distress. ?   Appearance: Normal appearance.  ?HENT:  ?   Head: Normocephalic and atraumatic.  ?   Right Ear: External ear normal.  ?   Left Ear: External ear normal.  ?Eyes:  ?   General: No scleral icterus.    ?   Right eye: No discharge.     ?   Left eye: No discharge.  ?   Conjunctiva/sclera: Conjunctivae normal.  ?Neck:  ?   Thyroid: No thyromegaly.  ?Cardiovascular:  ?   Rate and Rhythm: Normal rate and regular rhythm.  ?Pulmonary:  ?   Effort: No respiratory distress.  ?   Breath sounds: Normal breath sounds. No wheezing.  ?Abdominal:  ?   General: Bowel sounds are normal.  ?   Palpations: Abdomen is soft.  ?   Tenderness: There is no abdominal tenderness.  ?Musculoskeletal:     ?   General: No swelling or tenderness.  ?   Cervical back: Neck supple. No tenderness.  ?Lymphadenopathy:  ?   Cervical: No cervical adenopathy.  ?Skin: ?   Findings: No erythema or rash.  ?Neurological:  ?   Mental Status: She is alert.  ?Psychiatric:     ?   Mood and Affect: Mood normal.     ?   Behavior: Behavior normal.  ? ? ? ?Outpatient Encounter Medications as of 09/11/2021  ?Medication Sig  ? aspirin 81 MG EC tablet Take 81 mg by mouth daily.    ? meloxicam (MOBIC) 7.5 MG tablet Take 7.5 mg by mouth daily.  ? metFORMIN (GLUCOPHAGE) 500 MG tablet TAKE 1 TABLET (500 MG TOTAL) BY MOUTH 2 (TWO) TIMES DAILY WITH A MEAL.  ? rosuvastatin (CRESTOR) 10 MG  tablet Take 1 tablet (10 mg total) by mouth daily.  ? [DISCONTINUED] nebivolol (BYSTOLIC) 2.5 MG tablet TAKE 1/2 TABLET BY MOUTH DAILY  ? [DISCONTINUED] sertraline (ZOLOFT) 25 MG  tablet Take 1 tablet (25 mg total) by mouth daily.  ? nebivolol (BYSTOLIC) 2.5 MG tablet TAKE 1/2 TABLET BY MOUTH DAILY  ? sertraline (ZOLOFT) 25 MG tablet Take 1 tablet (25 mg total) by mouth daily.  ? [DISCONTINUED] fish oil-omega-3 fatty acids 1000 MG capsule Take 0.75 capsules by mouth daily.   (Patient not taking: Reported on 06/03/2021)  ? [DISCONTINUED] hydrochlorothiazide (MICROZIDE) 12.5 MG capsule Take 1 capsule (12.5 mg total) by mouth daily. (Patient not taking: Reported on 06/03/2021)  ? [DISCONTINUED] ibuprofen (ADVIL) 600 MG tablet Take one tablet by mouth 3 times a day (Patient not taking: Reported on 06/03/2021)  ? [DISCONTINUED] influenza vac split quadrivalent PF (FLUARIX QUADRIVALENT) 0.5 ML injection Inject into the muscle.  ? ?No facility-administered encounter medications on file as of 09/11/2021.  ?  ? ?Lab Results  ?Component Value Date  ? WBC 6.7 02/26/2021  ? HGB 13.1 02/26/2021  ? HCT 37.6 02/26/2021  ? PLT 239 02/26/2021  ? GLUCOSE 159 (H) 02/26/2021  ? CHOL 205 (H) 01/30/2021  ? TRIG 211.0 (H) 01/30/2021  ? HDL 39.00 (L) 01/30/2021  ? LDLDIRECT 132.0 01/30/2021  ? [place] 43 04/25/2020  ? ALT 17 02/26/2021  ? AST 22 02/26/2021  ? NA 136 02/26/2021  ? K 4.0 02/26/2021  ? CL 101 02/26/2021  ? CREATININE 0.68 02/26/2021  ? BUN 12 02/26/2021  ? CO2 26 02/26/2021  ? TSH 4.83 01/30/2021  ? INR 1.0 02/26/2021  ? HGBA1C 6.7 (H) 01/30/2021  ? MICROALBUR <0.7 12/12/2017  ? ? ?CT HEAD WO CONTRAST ? ?Result Date: 02/26/2021 ?CLINICAL DATA:  Mental status changes. EXAM: CT HEAD WITHOUT CONTRAST TECHNIQUE: Contiguous axial images were obtained from the base of the skull through the vertex without intravenous contrast. COMPARISON:  03/04/2009 FINDINGS: Brain: There is no evidence for acute hemorrhage, hydrocephalus, mass  lesion, or abnormal extra-axial fluid collection. No definite CT evidence for acute infarction. Diffuse loss of parenchymal volume is consistent with atrophy. Vascular: No hyperdense vessel or unexpected calcification.

## 2021-09-13 ENCOUNTER — Encounter: Payer: Self-pay | Admitting: Internal Medicine

## 2021-09-13 DIAGNOSIS — R4701 Aphasia: Secondary | ICD-10-CM | POA: Insufficient documentation

## 2021-09-13 NOTE — Assessment & Plan Note (Signed)
Low carb diet and exercise.  On metformin.  Follow met b and a1c.  ?

## 2021-09-13 NOTE — Assessment & Plan Note (Signed)
Previous ER visit as outlined with concerns of tachycardia and expressive aphasia.  Discussed concern regarding possible TIA.  On aspirin.  Will continue.  Discussed further w/up and evaluation.  Discussed referral to cardiology, given persistent intermittent episodes of tachycardia  - need to confirm no arrhythmia.  (question of need for zio monitor, echo, etc).  Also refer to neurology as discussed.  ?

## 2021-09-13 NOTE — Assessment & Plan Note (Signed)
Low carb diet and exercise.  Follow met b and a1c.  ?

## 2021-09-13 NOTE — Assessment & Plan Note (Signed)
On hctz and bystolic.  Continue current medication regimen.  Have her spot check her pressure.  Send in readings.  Follow pressures.  Follow metabolic panel.  ?

## 2021-09-13 NOTE — Assessment & Plan Note (Signed)
Crestor. Low cholesterol diet and exercise.  Follow lipid panel and liver function tests.  

## 2021-09-13 NOTE — Assessment & Plan Note (Signed)
Appears to be doing well on zoloft.   ?

## 2021-09-13 NOTE — Assessment & Plan Note (Signed)
Previously saw Dr Bary Castilla.  Now with increased concerns as outlined.  Refer back to surgery for evaluation.  ?

## 2021-09-21 ENCOUNTER — Encounter: Payer: Self-pay | Admitting: Internal Medicine

## 2021-09-21 ENCOUNTER — Other Ambulatory Visit: Payer: Self-pay

## 2021-09-21 ENCOUNTER — Other Ambulatory Visit: Payer: Self-pay | Admitting: Internal Medicine

## 2021-09-21 ENCOUNTER — Emergency Department: Payer: No Typology Code available for payment source

## 2021-09-21 ENCOUNTER — Emergency Department
Admission: EM | Admit: 2021-09-21 | Discharge: 2021-09-21 | Disposition: A | Discharge status: Home / Self Care | Payer: No Typology Code available for payment source | Attending: Emergency Medicine | Admitting: Emergency Medicine

## 2021-09-21 DIAGNOSIS — R4182 Altered mental status, unspecified: Secondary | ICD-10-CM | POA: Insufficient documentation

## 2021-09-21 DIAGNOSIS — R4701 Aphasia: Secondary | ICD-10-CM | POA: Diagnosis not present

## 2021-09-21 DIAGNOSIS — I10 Essential (primary) hypertension: Secondary | ICD-10-CM | POA: Diagnosis present

## 2021-09-21 DIAGNOSIS — E119 Type 2 diabetes mellitus without complications: Secondary | ICD-10-CM | POA: Diagnosis not present

## 2021-09-21 LAB — COMPREHENSIVE METABOLIC PANEL
ALT: 18 U/L (ref 0–44)
AST: 18 U/L (ref 15–41)
Albumin: 4.3 g/dL (ref 3.5–5.0)
Alkaline Phosphatase: 47 U/L (ref 38–126)
Anion gap: 8 (ref 5–15)
BUN: 10 mg/dL (ref 8–23)
CO2: 26 mmol/L (ref 22–32)
Calcium: 9.2 mg/dL (ref 8.9–10.3)
Chloride: 105 mmol/L (ref 98–111)
Creatinine, Ser: 0.51 mg/dL (ref 0.44–1.00)
GFR, Estimated: >60 mL/min (ref >60)
Glucose, Bld: 135 mg/dL — ABNORMAL HIGH (ref 70–99)
Potassium: 3.9 mmol/L (ref 3.5–5.1)
Sodium: 139 mmol/L (ref 135–145)
Total Bilirubin: 0.7 mg/dL (ref 0.3–1.2)
Total Protein: 7.4 g/dL (ref 6.5–8.1)

## 2021-09-21 LAB — CBC
HCT: 41.1 % (ref 36.0–46.0)
Hemoglobin: 13.5 g/dL (ref 12.0–15.0)
MCH: 31 pg (ref 26.0–34.0)
MCHC: 32.8 g/dL (ref 30.0–36.0)
MCV: 94.3 fL (ref 80.0–100.0)
Platelets: 260 10*3/uL (ref 150–400)
RBC: 4.36 MIL/uL (ref 3.87–5.11)
RDW: 12.8 % (ref 11.5–15.5)
WBC: 6.5 10*3/uL (ref 4.0–10.5)
nRBC: 0 % (ref 0.0–0.2)

## 2021-09-21 MED ORDER — HYDRALAZINE HCL 25 MG PO TABS
25.0000 mg | ORAL_TABLET | Freq: Three times a day (TID) | ORAL | 1 refills | Status: DC
Start: 2021-09-21 — End: 2021-09-23
  Filled 2021-09-21: qty 90, 30d supply, fill #0

## 2021-09-21 NOTE — ED Notes (Signed)
Patient transported to CT 

## 2021-09-21 NOTE — Telephone Encounter (Signed)
She went to ER.  Blood pressure better in ER. In reviewing, they did not prescribe medication.  Dr Derrel Nip messaged her back and sent in rx for hydralazine.  Please call and confirm she is doing ok.   ?

## 2021-09-21 NOTE — ED Provider Notes (Signed)
? ?[facility] ?Provider Note ? ? ? Event Date/Time  ? First MD Initiated Contact with Patient 09/21/21 1507   ?  (approximate) ? ? ?History  ? ?Chief Complaint ?Hypertension and Altered Mental Status ? ? ?HPI ?Margaret Mercado is a 66 y.o. female, history of hypertension, hyperlipidemia, transient cerebral ischemia, type 2 diabetes, presents to the emergency department for evaluation of hypertension/altered mental status.  Patient states that she has been experiencing intermittent episodes of memory disturbances and expressive aphasia, that have also been associated with high blood pressure at the time.  She states that she had 1 episode last week, that lasted for a few hours where she experienced difficulty formulating words, which then self resolved.  Today, she was on the phone with a customer when she began to experience similar symptoms, particularly in regards to her short-term memory and just thinking clearly in general, though did not have aphasia.  When she checked her blood pressure at that time, it was in the "200s/100s".  When she called her primary care provider, advised her to report to the emergency department.  She is currently asymptomatic at this time.  Denies fever/chills, recent injuries/illnesses, chest pain, shortness of breath, abdominal pain, nausea/vomiting, diarrhea, urinary symptoms, vision changes, hearing changes, or rashes/lesions. ? ?History Limitations: No limitations. ? ?    ? ? ?Physical Exam  ?Triage Vital Signs: ?ED Triage Vitals [09/21/21 1408]  ?Enc Vitals Group  ?   BP (!) 159/80  ?   Pulse Rate 61  ?   Resp 16  ?   Temp 98.2 ?F (36.8 ?C)  ?   Temp Source Oral  ?   SpO2 93 %  ?   Weight   ?   Height   ?   Head Circumference   ?   Peak Flow   ?   Pain Score   ?   Pain Loc   ?   Pain Edu?   ?   Excl. in August?   ? ? ?Most recent vital signs: ?Vitals:  ? 09/21/21 1408 09/21/21 1545  ?BP: (!) 159/80 (!) 155/78  ?Pulse: 61 60  ?Resp: 16 18  ?Temp: 98.2 ?F (36.8  ?C)   ?SpO2: 93% 95%  ? ? ?General: Awake, NAD.  ?Skin: Warm, dry. No rashes or lesions.  ?Eyes: PERRL. Conjunctivae normal.  EOMI.  No nystagmus. ?ENT: Throat clear, no erythema or exudates. Uvula midline.  ?Neck: Normal ROM. No nuchal rigidity.  ?CV: Good peripheral perfusion.  S1-S2 present.  Soft systolic murmur.  No rubs or gallops. ?Resp: Normal effort.  Lung sounds clear bilaterally ?Abd: Soft, non-tender. No distention.  ?Neuro: At baseline. No gross neurological deficits.  Cranial nerves II through XII intact.  5/5 strength in all extremities.  Pulse, motor, sensation intact in all extremities.  Patient is able to ambulate well on her own without ataxia. ?MSK: No gross deformities. Normal ROM in all extremities.  ? ? ?Physical Exam ? ? ? ?ED Results / Procedures / Treatments  ?Labs ?(all labs ordered are listed, but only abnormal results are displayed) ?Labs Reviewed  ?COMPREHENSIVE METABOLIC PANEL - Abnormal; Notable for the following components:  ?    Result Value  ? Glucose, Bld 135 (*)   ? All other components within normal limits  ?CBC  ?CBG MONITORING, ED  ? ? ? ?EKG ?Sinus rhythm, rate of 75, no T-segment changes, no AV blocks, no axis deviations, normal QRS interval. ? ? ?RADIOLOGY ? ?  ED Provider Interpretation: I personally viewed and interpreted this head CT, no evidence of acute intracranial findings. ? ?CT Head Wo Contrast ? ?Result Date: 09/21/2021 ?CLINICAL DATA:  Mental status change EXAM: CT HEAD WITHOUT CONTRAST TECHNIQUE: Contiguous axial images were obtained from the base of the skull through the vertex without intravenous contrast. RADIATION DOSE REDUCTION: This exam was performed according to the departmental dose-optimization program which includes automated exposure control, adjustment of the mA and/or kV according to patient size and/or use of iterative reconstruction technique. COMPARISON:  Head CT dated February 26, 2021 FINDINGS: Brain: Unchanged atrophy, most pronounced in the  frontal lobes. no evidence of acute infarction, hemorrhage, hydrocephalus, extra-axial collection or mass lesion/mass effect. Vascular: No hyperdense vessel or unexpected calcification. Skull: Normal. Negative for fracture or focal lesion. Sinuses/Orbits: No acute finding. Other: None. IMPRESSION: No acute intracranial abnormality. Electronically Signed   By: Yetta Glassman M.D.   On: 09/21/2021 15:03   ? ?PROCEDURES: ? ?Critical Care performed: None. ? ?Procedures ? ? ? ?MEDICATIONS ORDERED IN ED: ?Medications - No data to display ? ? ?IMPRESSION / MDM / ASSESSMENT AND PLAN / ED COURSE  ?I reviewed the triage vital signs and the nursing notes. ?             ?               ? ?Differential diagnosis includes, but is not limited to, hypertensive encephalopathy, subarachnoid hemorrhage, primary hypertension. ? ?ED Course ?Patient appears well.  Vital signs within normal limits for the patient.  NAD.  Currently afebrile.  She does not have any symptoms at this time. ? ?CBC shows no leukocytosis or anemia. ? ?CMP shows no evidence of electrolyte abnormalities, kidney injury, or transaminitis. ? ?Head CT shows no evidence of acute intracranial abnormalities. ? ?Assessment/Plan ?Patient presents with concern for hypertension associated with episodes of aphasia and cognitive deficits.  She is currently asymptomatic at this time.  Lab work-up reassuring.  Head CT shows no evidence of acute intracranial abnormalities.  It is possible that these episodes may be related to hypertension, though they appear to resolve on their own without any external intervention.  It is also possible that patient may be experiencing purely neurological deficits and experiencing hypotension as result to anxiety related to the episode.  Nonetheless, there is no evidence of any serious or life-threatening pathology at this time.  Patient has a appointment with neurology within the next 2 weeks.  Advised her to be further evaluated there.   Additionally encouraged her to follow-up with her primary care provider for ongoing blood pressure management. ? ?Considered admission for this patient, but given her stable presentation, reassuring lab work-up, and negative imaging, she is unlikely to benefit. ? ?Patient was provided with anticipatory guidance, return precautions, and educational material. Encouraged the patient to return to the emergency department at any time if they begin to experience any new or worsening symptoms. Patient expressed understanding and agreed with the plan. ? ?  ? ? ?FINAL CLINICAL IMPRESSION(S) / ED DIAGNOSES  ? ?Final diagnoses:  ?Hypertension, unspecified type  ? ? ? ?Rx / DC Orders  ? ?ED Discharge Orders   ? ? None  ? ?  ? ? ? ?Note:  This document was prepared using Dragon voice recognition software and may include unintentional dictation errors. ?  ?Teodoro Spray, [place] ?09/21/21 1911 ? ?  ?Rada Hay, MD ?09/21/21 2344 ? ?

## 2021-09-21 NOTE — ED Triage Notes (Signed)
Pt c/o having HTN this morning and took an extra b/p med with no relief , states she is having a hard time thinking clearly and has a HA, pt is ambulatory with a steady gait. States her b/p was 200's/100's this morning, state she has a similar spell last night and a month ago. ?

## 2021-09-21 NOTE — Discharge Instructions (Addendum)
-  Follow-up with your primary care provider and cardiologist for ongoing blood pressure management. ?-Follow-up with your neurologist, as discussed. ?-Return to the emergency department anytime if you begin to experience any new or worsening symptoms. ?

## 2021-09-21 NOTE — Telephone Encounter (Signed)
Patient BP this morning 202/112 Pulse 60 patient normally takes 0.5 Tablet of nebivolol 2.5 mg  takes at night because BP so high today patient took an extra quarter tablet of the 2.5 mg around 0730 and retook BP again 0830 BP still elevated 165/90 patient took the other quarter tablet to equal 1.25 mg patient BP while on phone 179/103 with BP 59 , patient C/O headache Patient says when her BP gets this high she has trouble remembering or focusing at 202/112 advised patient  she needs to be seen today to get BP down and to keep appt with Dr. 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 ?

## 2021-09-22 ENCOUNTER — Telehealth: Payer: Self-pay | Admitting: *Deleted

## 2021-09-22 ENCOUNTER — Other Ambulatory Visit: Payer: Self-pay

## 2021-09-22 NOTE — Telephone Encounter (Signed)
Patient says he advised ED she took that extra Bystolic half tablet for 9.19 mg and BP reported with in normal limits to patient by ED provider and that her other test were normal, patient took Bystolic 1.66 mg again last night BP this morning 176/96 pulse 60 patient took another 0.5 tablet Bystolic 0.60 mg. Patient reported feeling well this morning no confusion, speaking clearly on phone oriented to date and time. Patient stated DOC of day called in Hydralazine 25 mg 3 times daily patient ask should she start since she took extra Bystoloic? Patient also has appointment with PCP 09/23/21 at 0700. ?

## 2021-09-22 NOTE — Telephone Encounter (Signed)
If blood pressure continues to be a little elevated today, go ahead and start hydralazine.  Can take pm dose this pm ?

## 2021-09-22 NOTE — Telephone Encounter (Signed)
Patient notified and voiced understanding she will monitor BP and keep appt and take Hydralazine if BP still elevated. ?

## 2021-09-23 ENCOUNTER — Ambulatory Visit (INDEPENDENT_AMBULATORY_CARE_PROVIDER_SITE_OTHER): Payer: No Typology Code available for payment source | Admitting: Internal Medicine

## 2021-09-23 ENCOUNTER — Other Ambulatory Visit: Payer: Self-pay

## 2021-09-23 ENCOUNTER — Encounter: Payer: Self-pay | Admitting: Internal Medicine

## 2021-09-23 DIAGNOSIS — E1165 Type 2 diabetes mellitus with hyperglycemia: Secondary | ICD-10-CM

## 2021-09-23 DIAGNOSIS — I10 Essential (primary) hypertension: Secondary | ICD-10-CM

## 2021-09-23 DIAGNOSIS — E785 Hyperlipidemia, unspecified: Secondary | ICD-10-CM

## 2021-09-23 DIAGNOSIS — Z0289 Encounter for other administrative examinations: Secondary | ICD-10-CM

## 2021-09-23 DIAGNOSIS — R519 Headache, unspecified: Secondary | ICD-10-CM | POA: Diagnosis not present

## 2021-09-23 DIAGNOSIS — R739 Hyperglycemia, unspecified: Secondary | ICD-10-CM

## 2021-09-23 DIAGNOSIS — K439 Ventral hernia without obstruction or gangrene: Secondary | ICD-10-CM

## 2021-09-23 DIAGNOSIS — F439 Reaction to severe stress, unspecified: Secondary | ICD-10-CM

## 2021-09-23 DIAGNOSIS — R4701 Aphasia: Secondary | ICD-10-CM

## 2021-09-23 LAB — COMPREHENSIVE METABOLIC PANEL
ALT: 14 U/L (ref 0–35)
AST: 15 U/L (ref 0–37)
Albumin: 4.5 g/dL (ref 3.5–5.2)
Alkaline Phosphatase: 41 U/L (ref 39–117)
BUN: 11 mg/dL (ref 6–23)
CO2: 27 mEq/L (ref 19–32)
Calcium: 9.4 mg/dL (ref 8.4–10.5)
Chloride: 102 mEq/L (ref 96–112)
Creatinine, Ser: 0.64 mg/dL (ref 0.40–1.20)
GFR: 93.41 mL/min (ref >60.00)
Glucose, Bld: 141 mg/dL — ABNORMAL HIGH (ref 70–99)
Potassium: 3.9 mEq/L (ref 3.5–5.1)
Sodium: 137 mEq/L (ref 135–145)
Total Bilirubin: 0.5 mg/dL (ref 0.2–1.2)
Total Protein: 7 g/dL (ref 6.0–8.3)

## 2021-09-23 LAB — LIPID PANEL
Cholesterol: 255 mg/dL — ABNORMAL HIGH (ref 0–200)
HDL: 46.7 mg/dL (ref >39.00)
NonHDL: 208.41
Total CHOL/HDL Ratio: 5
Triglycerides: 275 mg/dL — ABNORMAL HIGH (ref 0.0–149.0)
VLDL: 55 mg/dL — ABNORMAL HIGH (ref 0.0–40.0)

## 2021-09-23 LAB — HEMOGLOBIN A1C: Hgb A1c MFr Bld: 6.3 % (ref 4.6–6.5)

## 2021-09-23 LAB — LDL CHOLESTEROL, DIRECT: Direct LDL: 174 mg/dL

## 2021-09-23 LAB — MICROALBUMIN / CREATININE URINE RATIO
Creatinine,U: 84 mg/dL
Microalb Creat Ratio: 0.8 mg/g (ref 0.0–30.0)
Microalb, Ur: 0.7 mg/dL (ref 0.0–1.9)

## 2021-09-23 LAB — SEDIMENTATION RATE: Sed Rate: 33 mm/hr — ABNORMAL HIGH (ref 0–30)

## 2021-09-23 MED ORDER — HYDROCHLOROTHIAZIDE 12.5 MG PO CAPS
12.5000 mg | ORAL_CAPSULE | Freq: Every day | ORAL | 1 refills | Status: DC
  Filled 2021-09-23 – 2022-04-26 (×2): qty 30, 30d supply, fill #0
  Filled 2022-07-13: qty 30, 30d supply, fill #1

## 2021-09-23 NOTE — Patient Instructions (Signed)
Start taking sertraline '25mg'$  per day x 1 week and then increase to '50mg'$  per day ? ?Add hctz.   ?

## 2021-09-23 NOTE — Progress Notes (Signed)
Patient ID: Margaret Mercado, female   DOB: Mar 08, 1956, 66 y.o.   MRN: [id number] ? ? ?Subjective:  ? ? Patient ID: Margaret Mercado, female    DOB: 11-11-1955, 66 y.o.   MRN: [id number] ? ?This visit occurred during the SARS-CoV-2 public health emergency.  Safety protocols were in place, including screening questions prior to the visit, additional usage of staff PPE, and extensive cleaning of exam room while observing appropriate contact time as indicated for disinfecting solutions.  ? ?Patient here for work in appt.  ? ?Chief Complaint  ?Patient presents with  ? Follow-up  ?  F/u for hypertension/ ER visit   ? .  ? ?HPI ?Was seen in ER with reports of "cognitive blurring" and elevated blood pressure.  In reviewing note, reports some intermittent episodes of memory disturbance and expressive aphasia - associated with high blood pressure.  The day of ER visit, reported she was on the phone with a customer when she began to experience similar symptoms, particularly in regards to her short-term memory and just thinking clearly in general, though did not have aphasia. Blood pressure at that time 200s/100s.  No focal neurological changes noted on exam.  CT head - ok - no acute intracranial abnormality.  Since her ER visit, took extra doses of bystolic.  On call MD, called in hydralazine.  She took three of these yesterday.  Felt caused increased heart rate.  Highest heart rate in the 70s.  (States usually runs in the 50-60s).  She reports increased stress.  Specifically work stress.  Also some family medical issues. She feels when experiences increased stress, then will have symptoms of not being able think as well or respond as quickly or as well.  No facial drooping, slurring of speech or other neurological issues.  No chest pain.  Breathing stable.  No abdominal pain or bowel change reported.  Does report the intermittent heart racing.  Request FMLA paperwork to be completed.  ? ? ?Past Medical History:  ?Diagnosis Date   ? Abnormal echocardiogram 01/2009  ? Mild LVH, EF >55%, no regional wall motion abnormalities, normal RV, mild aortic insufficiency, no aortic stenosis.  ? Anxiety   ? Heart murmur   ? Hyperlipidemia   ? Hypertension   ? Edema with Norvasc. Unable to tolerate Lisinopril/ HCTZ.  ? Hypokalemia   ? Manifested by lip and finger tingling  ? Palpitations   ? Event monitor 01/2009 with no significant arrhythmias  ? TIA (transient ischemic attack) 01/2009  ? CT and MRI of head showed no evidence for stroke. Carotid dopplers showed no significant plaque  ? Ventral hernia   ? ?Past Surgical History:  ?Procedure Laterality Date  ? CRYOABLATION    ? Of uterus  ? UMBILICAL HERNIA REPAIR  2010  ? Dr Nicholes Stairs  ? URETHRAL DILATION    ? removal of bladder polyps  ? ?Family History  ?Problem Relation Age of Onset  ? Hypertension Mother   ? Arrhythmia Mother   ?     Atrial fibrillation  ? Hypertension Father   ? Valvular heart disease Father   ? Hypertension Brother   ? Hypertension Paternal Grandmother   ? Coronary artery disease Neg Hx   ?     Premature  ? Breast cancer Neg Hx   ? Colon cancer Neg Hx   ? ?Social History  ? ?Socioeconomic History  ? Marital status: Married  ?  Spouse name: Not on file  ? Number of  children: 2  ? Years of education: Not on file  ? Highest education level: Not on file  ?Occupational History  ? Occupation: Horticulturist, commercial  ?  Comment: full time  ?Tobacco Use  ? Smoking status: Never  ? Smokeless tobacco: Never  ?Vaping Use  ? Vaping Use: Never used  ?Substance and Sexual Activity  ? Alcohol use: No  ?  Alcohol/week: 0.0 standard drinks  ? Drug use: No  ? Sexual activity: Not on file  ?Other Topics Concern  ? Not on file  ?Social History Narrative  ? Married  ? Gets regular exercise  ? ?Social Determinants of Health  ? ?Financial Resource Strain: Not on file  ?Food Insecurity: Not on file  ?Transportation Needs: Not on file  ?Physical Activity: Not on file  ?Stress: Not on file  ?Social Connections:  Not on file  ? ? ? ?Review of Systems  ?Constitutional:  Negative for appetite change and unexpected weight change.  ?HENT:  Negative for congestion and sinus pressure.   ?Respiratory:  Negative for cough and chest tightness.   ?     Breathing stable.   ?Cardiovascular:  Negative for chest pain and leg swelling.  ?     Increased heart rate as outlined.   ?Gastrointestinal:  Negative for abdominal pain, diarrhea, nausea and vomiting.  ?Genitourinary:  Negative for difficulty urinating and dysuria.  ?Musculoskeletal:  Negative for joint swelling and myalgias.  ?Skin:  Negative for color change and rash.  ?Neurological:  Negative for dizziness and light-headedness.  ?     Does report some headaches.  Mostly localized - frontal headaches.   ?Psychiatric/Behavioral:  Negative for agitation.   ?     Increased stress as outlined.   ? ?   ?Objective:  ?  ? ?BP (!) 150/72   Pulse 60   Temp 98.3 ?F (36.8 ?C) (Temporal)   Resp 14   Ht 5' (1.524 m)   Wt 204 lb (92.5 kg)   SpO2 96%   BMI 39.84 kg/m?  ?Wt Readings from Last 3 Encounters:  ?09/23/21 204 lb (92.5 kg)  ?09/21/21 204 lb 2.3 oz (92.6 kg)  ?09/11/21 204 lb 3.2 oz (92.6 kg)  ? ? ?Physical Exam ?Vitals reviewed.  ?Constitutional:   ?   General: She is not in acute distress. ?   Appearance: Normal appearance.  ?HENT:  ?   Head: Normocephalic and atraumatic.  ?   Right Ear: External ear normal.  ?   Left Ear: External ear normal.  ?Eyes:  ?   General: No scleral icterus.    ?   Right eye: No discharge.     ?   Left eye: No discharge.  ?   Conjunctiva/sclera: Conjunctivae normal.  ?Neck:  ?   Thyroid: No thyromegaly.  ?Cardiovascular:  ?   Rate and Rhythm: Normal rate and regular rhythm.  ?Pulmonary:  ?   Effort: No respiratory distress.  ?   Breath sounds: Normal breath sounds. No wheezing.  ?Abdominal:  ?   General: Bowel sounds are normal.  ?   Palpations: Abdomen is soft.  ?   Tenderness: There is no abdominal tenderness.  ?Musculoskeletal:     ?   General: No  swelling or tenderness.  ?   Cervical back: Neck supple. No tenderness.  ?Lymphadenopathy:  ?   Cervical: No cervical adenopathy.  ?Skin: ?   Findings: No erythema or rash.  ?Neurological:  ?   Mental Status: She is  alert.  ?Psychiatric:     ?   Mood and Affect: Mood normal.     ?   Behavior: Behavior normal.  ? ? ? ?Outpatient Encounter Medications as of 09/23/2021  ?Medication Sig  ? aspirin 81 MG EC tablet Take 81 mg by mouth daily.    ? hydrochlorothiazide (MICROZIDE) 12.5 MG capsule Take 1 capsule (12.5 mg total) by mouth daily.  ? meloxicam (MOBIC) 7.5 MG tablet Take 7.5 mg by mouth daily.  ? nebivolol (BYSTOLIC) 2.5 MG tablet TAKE 1/2 TABLET BY MOUTH DAILY  ? rosuvastatin (CRESTOR) 10 MG tablet Take 1 tablet (10 mg total) by mouth daily.  ? sertraline (ZOLOFT) 25 MG tablet Take 1 tablet (25 mg total) by mouth daily.  ? [DISCONTINUED] hydrALAZINE (APRESOLINE) 25 MG tablet Take 1 tablet (25 mg total) by mouth 3 (three) times daily.  ? [DISCONTINUED] metFORMIN (GLUCOPHAGE) 500 MG tablet TAKE 1 TABLET (500 MG TOTAL) BY MOUTH 2 (TWO) TIMES DAILY WITH A MEAL.  ? ?No facility-administered encounter medications on file as of 09/23/2021.  ?  ? ?Lab Results  ?Component Value Date  ? WBC 6.5 09/21/2021  ? HGB 13.5 09/21/2021  ? HCT 41.1 09/21/2021  ? PLT 260 09/21/2021  ? GLUCOSE 141 (H) 09/23/2021  ? CHOL 255 (H) 09/23/2021  ? TRIG 275.0 (H) 09/23/2021  ? HDL 46.70 09/23/2021  ? LDLDIRECT 174.0 09/23/2021  ? [place] 43 04/25/2020  ? ALT 14 09/23/2021  ? AST 15 09/23/2021  ? NA 137 09/23/2021  ? K 3.9 09/23/2021  ? CL 102 09/23/2021  ? CREATININE 0.64 09/23/2021  ? BUN 11 09/23/2021  ? CO2 27 09/23/2021  ? TSH 4.83 01/30/2021  ? INR 1.0 02/26/2021  ? HGBA1C 6.3 09/23/2021  ? MICROALBUR 0.7 09/23/2021  ? ? ?CT Head Wo Contrast ? ?Result Date: 09/21/2021 ?CLINICAL DATA:  Mental status change EXAM: CT HEAD WITHOUT CONTRAST TECHNIQUE: Contiguous axial images were obtained from the base of the skull through the vertex without  intravenous contrast. RADIATION DOSE REDUCTION: This exam was performed according to the departmental dose-optimization program which includes automated exposure control, adjustment of the mA and/or kV accordin

## 2021-09-24 ENCOUNTER — Encounter: Payer: Self-pay | Admitting: Internal Medicine

## 2021-09-24 DIAGNOSIS — Z0289 Encounter for other administrative examinations: Secondary | ICD-10-CM | POA: Insufficient documentation

## 2021-09-24 NOTE — Assessment & Plan Note (Signed)
Off hctz.  Has been taking bystolic.  Blood pressure elevated recently as outlined.  Did not tolerate hydralazine.  Will restart hctz.  Continue bystolic.  Follow pressures and pulse rate.  Treat anxiety.  Follow closely.  Recheck metabolic panel.  ?

## 2021-09-24 NOTE — Assessment & Plan Note (Signed)
Have referred back to surgery for evaluation.  See last note.  ?

## 2021-09-24 NOTE — Assessment & Plan Note (Signed)
Previous ER visit as outlined with concerns of tachycardia and expressive aphasia.  Have discussed concern regarding possible TIA.  On aspirin.  Will continue.  Have discussed further w/up and evaluation.  Have discussed referral to cardiology, given persistent intermittent episodes of tachycardia  - need to confirm no arrhythmia.  (question of need for zio monitor, echo, etc).  Also referral to neurology has been placed.  She has an appt 10/11/21 with neurology.  Will follow up with scheduling cardiology appt.  Has not been scheduled.  Discussed anxiety/stress - aggravating symptoms.  Treat with zoloft as outlined.  Follow closely.  Need to get better control of blood pressure.  ?

## 2021-09-24 NOTE — Assessment & Plan Note (Signed)
Low carb diet and exercise.  On metformin.  Follow met b and a1c.  ?

## 2021-09-24 NOTE — Assessment & Plan Note (Signed)
Increased stress as outlined.  Discussed.  When gets stressed and experiences increased anxiety, this is when she notices not being able to think as well or respond as quickly or as well.  Discussed the need to treat.  She has previously been taking zoloft prn.  Have her take daily - '25mg'$  q day x 1 week and then '50mg'$  q day.  Follow closely.  Get her back in soon to reassess.  ?

## 2021-09-24 NOTE — Assessment & Plan Note (Signed)
Crestor. Low cholesterol diet and exercise.  Follow lipid panel and liver function tests.  

## 2021-09-24 NOTE — Assessment & Plan Note (Signed)
Discussed FMLA form to be completed.  Information obtained.  Form to be completed.  ?

## 2021-09-24 NOTE — Assessment & Plan Note (Signed)
Has noticed intermittent headache as outlined.  Recent CT unrevealing.  Check esr.  Treat anxiety.  Get better control of blood pressure.  ?

## 2021-09-28 ENCOUNTER — Encounter: Payer: Self-pay | Admitting: Internal Medicine

## 2021-09-30 ENCOUNTER — Ambulatory Visit: Payer: No Typology Code available for payment source | Admitting: Internal Medicine

## 2021-10-01 ENCOUNTER — Telehealth: Payer: Self-pay | Admitting: Internal Medicine

## 2021-10-01 NOTE — Telephone Encounter (Signed)
Called to inquire dates needed for FMLA, time out of work or intermittent, concerning blood pressure. Left message to call office. ?

## 2021-10-01 NOTE — Telephone Encounter (Signed)
Completed as much of form as I could some question I would not complete without PCP approval (intermittent leave) patient asking for up to 4 times per month if BP becomes high and she has symptoms, headache, dizziness to leave work and seek medical attention. Also asking intermittent for office visits for BP. Patient also ask for continuous for the dates of 09/21/21 until 09/29/21 hypertension seen in ED for hypertension and then visit with PCP 09/23/2021. Giving form s back to PCP. ?  ?

## 2021-10-02 NOTE — Telephone Encounter (Signed)
Patient returned your call . Informed patient that her paperwork was sent to [employer] and an additional copy was up front to pick up. ?

## 2021-10-02 NOTE — Telephone Encounter (Signed)
Form completed. In box.  Needs a f/u appt with me in 3 weeks.  ?

## 2021-10-02 NOTE — Telephone Encounter (Signed)
faxed to [employer] at [phone] ?Copy made for pt to p/u ? ?

## 2021-10-08 ENCOUNTER — Other Ambulatory Visit: Payer: Self-pay

## 2021-10-14 ENCOUNTER — Other Ambulatory Visit: Payer: Self-pay

## 2021-11-02 ENCOUNTER — Other Ambulatory Visit: Payer: Self-pay

## 2021-11-03 ENCOUNTER — Other Ambulatory Visit: Payer: Self-pay

## 2021-11-03 ENCOUNTER — Encounter: Payer: Self-pay | Admitting: Internal Medicine

## 2021-11-03 MED ORDER — SERTRALINE HCL 50 MG PO TABS
50.0000 mg | ORAL_TABLET | Freq: Every day | ORAL | 3 refills | Status: DC
  Filled 2021-11-03: qty 30, 30d supply, fill #0
  Filled 2021-12-10: qty 30, 30d supply, fill #1
  Filled 2022-01-21: qty 30, 30d supply, fill #2
  Filled 2022-03-04: qty 30, 30d supply, fill #3

## 2021-11-12 ENCOUNTER — Telehealth: Payer: Self-pay | Admitting: Internal Medicine

## 2021-11-12 NOTE — Telephone Encounter (Signed)
Lm to call office to schedule a follow up with Dr Nicki Reaper.

## 2021-11-23 ENCOUNTER — Other Ambulatory Visit: Payer: Self-pay

## 2021-11-24 ENCOUNTER — Other Ambulatory Visit: Payer: Self-pay

## 2021-12-10 ENCOUNTER — Other Ambulatory Visit: Payer: Self-pay

## 2021-12-11 ENCOUNTER — Other Ambulatory Visit: Payer: Self-pay

## 2021-12-14 ENCOUNTER — Other Ambulatory Visit: Payer: Self-pay

## 2021-12-21 ENCOUNTER — Other Ambulatory Visit: Payer: Self-pay

## 2021-12-21 MED ORDER — ERGOCALCIFEROL 1.25 MG (50000 UT) PO CAPS
ORAL_CAPSULE | ORAL | 0 refills | Status: DC
  Filled 2021-12-21: qty 8, 60d supply, fill #0

## 2022-01-21 ENCOUNTER — Other Ambulatory Visit: Payer: Self-pay

## 2022-01-27 ENCOUNTER — Encounter: Payer: Self-pay | Admitting: Internal Medicine

## 2022-01-27 DIAGNOSIS — E1165 Type 2 diabetes mellitus with hyperglycemia: Secondary | ICD-10-CM

## 2022-01-27 DIAGNOSIS — E785 Hyperlipidemia, unspecified: Secondary | ICD-10-CM

## 2022-01-27 DIAGNOSIS — I10 Essential (primary) hypertension: Secondary | ICD-10-CM

## 2022-01-29 NOTE — Telephone Encounter (Signed)
Orders placed for labs.  Ok to change to CPE.

## 2022-01-29 NOTE — Telephone Encounter (Signed)
Ok to change to CPE.  I have ordered labs.

## 2022-02-08 ENCOUNTER — Telehealth: Payer: Self-pay | Admitting: Internal Medicine

## 2022-02-08 DIAGNOSIS — Z1231 Encounter for screening mammogram for malignant neoplasm of breast: Secondary | ICD-10-CM

## 2022-02-08 NOTE — Telephone Encounter (Signed)
Received notification that she is overdue a mammogram.  Need to schedule.  Has upcoming appt scheduled.  I have placed the order for the mammogram.

## 2022-02-10 ENCOUNTER — Other Ambulatory Visit: Payer: Self-pay

## 2022-02-10 ENCOUNTER — Encounter: Payer: Self-pay | Admitting: Internal Medicine

## 2022-02-10 ENCOUNTER — Other Ambulatory Visit (HOSPITAL_COMMUNITY)
Admission: RE | Admit: 2022-02-10 | Discharge: 2022-02-10 | Disposition: A | Discharge status: Home / Self Care | Payer: No Typology Code available for payment source | Source: Ambulatory Visit | Attending: Internal Medicine | Admitting: Internal Medicine

## 2022-02-10 ENCOUNTER — Ambulatory Visit (INDEPENDENT_AMBULATORY_CARE_PROVIDER_SITE_OTHER): Payer: No Typology Code available for payment source | Admitting: Internal Medicine

## 2022-02-10 VITALS — BP 130/80 | HR 65 | Temp 97.7°F | Resp 14 | Ht 60.0 in | Wt 200.0 lb

## 2022-02-10 DIAGNOSIS — Z1211 Encounter for screening for malignant neoplasm of colon: Secondary | ICD-10-CM | POA: Diagnosis not present

## 2022-02-10 DIAGNOSIS — Z0000 Encounter for general adult medical examination without abnormal findings: Secondary | ICD-10-CM | POA: Diagnosis not present

## 2022-02-10 DIAGNOSIS — Z124 Encounter for screening for malignant neoplasm of cervix: Secondary | ICD-10-CM | POA: Insufficient documentation

## 2022-02-10 DIAGNOSIS — K649 Unspecified hemorrhoids: Secondary | ICD-10-CM

## 2022-02-10 DIAGNOSIS — R21 Rash and other nonspecific skin eruption: Secondary | ICD-10-CM

## 2022-02-10 DIAGNOSIS — K439 Ventral hernia without obstruction or gangrene: Secondary | ICD-10-CM

## 2022-02-10 DIAGNOSIS — Z1231 Encounter for screening mammogram for malignant neoplasm of breast: Secondary | ICD-10-CM

## 2022-02-10 DIAGNOSIS — F439 Reaction to severe stress, unspecified: Secondary | ICD-10-CM

## 2022-02-10 DIAGNOSIS — R4701 Aphasia: Secondary | ICD-10-CM

## 2022-02-10 DIAGNOSIS — I10 Essential (primary) hypertension: Secondary | ICD-10-CM

## 2022-02-10 DIAGNOSIS — E1165 Type 2 diabetes mellitus with hyperglycemia: Secondary | ICD-10-CM

## 2022-02-10 DIAGNOSIS — E785 Hyperlipidemia, unspecified: Secondary | ICD-10-CM

## 2022-02-10 MED ORDER — NYSTATIN 100000 UNIT/GM EX POWD
1.0000 | Freq: Two times a day (BID) | CUTANEOUS | 0 refills | Status: DC
  Filled 2022-02-10: qty 60, 15d supply, fill #0

## 2022-02-10 NOTE — Progress Notes (Signed)
Patient ID: Margaret Mercado, female   DOB: 18-Nov-1955, 66 y.o.   MRN: [id number]   Subjective:    Patient ID: Margaret Mercado, female    DOB: 1955/09/26, 66 y.o.   MRN: [id number]   Patient here for  Chief Complaint  Patient presents with   Annual Exam   .   HPI Reports she is doing relatively well.  Taking bystolic and hctz.  (Hctz averaging 3x/week).  Blood pressure doing better.  No chest pain reported.  Breathing stable.  No increased cough or congestion.  No acid reflux reported.  Saw neurology 12/09/21 - episode of expressive aphasia 09/2021.  Negative head CT.  MRI brain - no acute abnormality.  Brain atrophy - frontal lobe predominant - per report.  Recommended EEG.  On zoloft.  Previous sharp pains - kegel exercises helped.  Has problems with hemorrhoids.  Occasionally bleed.  Request referral to Dr Bary Castilla for evaluation.    Past Medical History:  Diagnosis Date   Abnormal echocardiogram 01/2009   Mild LVH, EF >55%, no regional wall motion abnormalities, normal RV, mild aortic insufficiency, no aortic stenosis.   Anxiety    Heart murmur    Hyperlipidemia    Hypertension    Edema with Norvasc. Unable to tolerate Lisinopril/ HCTZ.   Hypokalemia    Manifested by lip and finger tingling   Palpitations    Event monitor 01/2009 with no significant arrhythmias   TIA (transient ischemic attack) 01/2009   CT and MRI of head showed no evidence for stroke. Carotid dopplers showed no significant plaque   Ventral hernia    Past Surgical History:  Procedure Laterality Date   CRYOABLATION     Of uterus   UMBILICAL HERNIA REPAIR  2010   Dr Nicholes Stairs   URETHRAL DILATION     removal of bladder polyps   Family History  Problem Relation Age of Onset   Hypertension Mother    Arrhythmia Mother        Atrial fibrillation   Hypertension Father    Valvular heart disease Father    Hypertension Brother    Hypertension Paternal Grandmother    Coronary artery disease Neg Hx         Premature   Breast cancer Neg Hx    Colon cancer Neg Hx    Social History   Socioeconomic History   Marital status: Married    Spouse name: Not on file   Number of children: 2   Years of education: Not on file   Highest education level: Not on file  Occupational History   Occupation: Horticulturist, commercial    Comment: full time  Tobacco Use   Smoking status: Never   Smokeless tobacco: Never  Vaping Use   Vaping Use: Never used  Substance and Sexual Activity   Alcohol use: No    Alcohol/week: 0.0 standard drinks of alcohol   Drug use: No   Sexual activity: Not on file  Other Topics Concern   Not on file  Social History Narrative   Married   Gets regular exercise   Social Determinants of Health   Financial Resource Strain: Not on file  Food Insecurity: Not on file  Transportation Needs: Not on file  Physical Activity: Not on file  Stress: Not on file  Social Connections: Not on file     Review of Systems  Constitutional:  Negative for activity change and appetite change.  HENT:  Negative for congestion, sinus pressure and  sore throat.   Eyes:  Negative for pain and visual disturbance.  Respiratory:  Negative for cough, chest tightness and shortness of breath.   Cardiovascular:  Negative for chest pain and palpitations.       No increased swelling  Gastrointestinal:  Negative for abdominal pain, diarrhea, nausea and vomiting.  Genitourinary:  Negative for difficulty urinating and dysuria.  Musculoskeletal:  Negative for joint swelling and myalgias.  Skin:  Negative for color change and rash.  Neurological:  Negative for dizziness, light-headedness and headaches.  Hematological:  Negative for adenopathy. Does not bruise/bleed easily.  Psychiatric/Behavioral:  Negative for agitation and dysphoric mood.        Objective:     BP 130/80 (BP Location: Left Arm, Patient Position: Sitting, Cuff Size: Normal)   Pulse 65   Temp 97.7 F (36.5 C) (Oral)   Resp 14   Ht  5' (1.524 m)   Wt 200 lb (90.7 kg)   SpO2 97%   BMI 39.06 kg/m  Wt Readings from Last 3 Encounters:  02/10/22 200 lb (90.7 kg)  09/23/21 204 lb (92.5 kg)  09/21/21 204 lb 2.3 oz (92.6 kg)    Physical Exam Vitals reviewed.  Constitutional:      General: She is not in acute distress.    Appearance: Normal appearance. She is well-developed.  HENT:     Head: Normocephalic and atraumatic.     Right Ear: External ear normal.     Left Ear: External ear normal.  Eyes:     General: No scleral icterus.       Right eye: No discharge.        Left eye: No discharge.     Conjunctiva/sclera: Conjunctivae normal.  Neck:     Thyroid: No thyromegaly.  Cardiovascular:     Rate and Rhythm: Normal rate and regular rhythm.  Pulmonary:     Effort: No tachypnea, accessory muscle usage or respiratory distress.     Breath sounds: Normal breath sounds. No decreased breath sounds or wheezing.  Chest:  Breasts:    Right: No inverted nipple, mass, nipple discharge or tenderness (no axillary adenopathy).     Left: No inverted nipple, mass, nipple discharge or tenderness (no axilarry adenopathy).  Abdominal:     General: Bowel sounds are normal.     Palpations: Abdomen is soft.     Tenderness: There is no abdominal tenderness.  Genitourinary:    Comments: Normal external genitalia.  Vaginal vault without lesions.  Cervix identified.  Pap smear performed.  Could not appreciate any adnexal masses or tenderness.   Musculoskeletal:        General: No swelling or tenderness.     Cervical back: Neck supple.  Lymphadenopathy:     Cervical: No cervical adenopathy.  Skin:    Findings: No erythema or rash.  Neurological:     Mental Status: She is alert and oriented to person, place, and time.  Psychiatric:        Mood and Affect: Mood normal.        Behavior: Behavior normal.      Outpatient Encounter Medications as of 02/10/2022  Medication Sig   aspirin 81 MG EC tablet Take 81 mg by mouth daily.      ergocalciferol (VITAMIN D2) 1.25 MG (50000 UT) capsule TAKE 1 CAPSULE BY MOUTH ONCE A WEEK FOR 8 DOSES   hydrochlorothiazide (MICROZIDE) 12.5 MG capsule Take 1 capsule (12.5 mg total) by mouth daily.   meloxicam (MOBIC) 7.5 MG  tablet Take 7.5 mg by mouth daily.   nystatin (MYCOSTATIN/NYSTOP) powder Apply 1 Application topically 2 (two) times daily.   rosuvastatin (CRESTOR) 10 MG tablet Take 1 tablet (10 mg total) by mouth daily.   sertraline (ZOLOFT) 50 MG tablet Take 1 tablet (50 mg total) by mouth daily.   [DISCONTINUED] nebivolol (BYSTOLIC) 2.5 MG tablet TAKE 1/2 TABLET BY MOUTH DAILY   No facility-administered encounter medications on file as of 02/10/2022.     Lab Results  Component Value Date   WBC 6.5 09/21/2021   HGB 13.5 09/21/2021   HCT 41.1 09/21/2021   PLT 260 09/21/2021   GLUCOSE 141 (H) 09/23/2021   CHOL 255 (H) 09/23/2021   TRIG 275.0 (H) 09/23/2021   HDL 46.70 09/23/2021   LDLDIRECT 174.0 09/23/2021   LDLCALC 43 04/25/2020   ALT 14 09/23/2021   AST 15 09/23/2021   NA 137 09/23/2021   K 3.9 09/23/2021   CL 102 09/23/2021   CREATININE 0.64 09/23/2021   BUN 11 09/23/2021   CO2 27 09/23/2021   TSH 4.83 01/30/2021   INR 1.0 02/26/2021   HGBA1C 6.3 09/23/2021   MICROALBUR 0.7 09/23/2021    CT Head Wo Contrast  Result Date: 09/21/2021 CLINICAL DATA:  Mental status change EXAM: CT HEAD WITHOUT CONTRAST TECHNIQUE: Contiguous axial images were obtained from the base of the skull through the vertex without intravenous contrast. RADIATION DOSE REDUCTION: This exam was performed according to the departmental dose-optimization program which includes automated exposure control, adjustment of the mA and/or kV according to patient size and/or use of iterative reconstruction technique. COMPARISON:  Head CT dated February 26, 2021 FINDINGS: Brain: Unchanged atrophy, most pronounced in the frontal lobes. no evidence of acute infarction, hemorrhage, hydrocephalus, extra-axial  collection or mass lesion/mass effect. Vascular: No hyperdense vessel or unexpected calcification. Skull: Normal. Negative for fracture or focal lesion. Sinuses/Orbits: No acute finding. Other: None. IMPRESSION: No acute intracranial abnormality. Electronically Signed   By: Yetta Glassman M.D.   On: 09/21/2021 15:03       Assessment & Plan:   Problem List Items Addressed This Visit     Breast cancer screening    Schedule mammogram. Phone number given.       Relevant Orders   MM 3D SCREEN BREAST BILATERAL   Essential hypertension    Taking bystolic and hctz as outline.  Did not tolerated hydralazine.  Follow pressures. Follow metabolic panel.       Expressive aphasia    Saw neurology.  Recommended EEG      Healthcare maintenance    Physical today 02/10/22.  PAP 02/10/22.  Schedule mammogram.  Discussed Cologuard.        Hemorrhoids    Occasional bleeding.  Request referral to Dr Bary Castilla.       Relevant Orders   Ambulatory referral to General Surgery   Hyperlipidemia LDL goal <70    Crestor.   Low cholesterol diet and exercise.  Follow lipid panel and liver function tests.        Rash    Nystatin powder.       Stress    Overall appears to be handling things relatively well.  Follow.       Type 2 diabetes mellitus with hyperglycemia (HCC)    Low carb diet and exercise.  On metformin.  Follow met b and a1c.       Ventral hernia    Request referral to Dr Bary Castilla for evaluation.  Relevant Orders   Ambulatory referral to General Surgery   Other Visit Diagnoses     Routine general medical examination at a health care facility    -  Primary   Cervical cancer screening       Relevant Orders   Cytology - PAP( [place]) (Completed)   Colon cancer screening            Margaret Pheasant, MD

## 2022-02-10 NOTE — Assessment & Plan Note (Addendum)
Physical today 02/10/22.  PAP 02/10/22.  Schedule mammogram.  Discussed Cologuard.

## 2022-02-12 ENCOUNTER — Encounter: Payer: Self-pay | Admitting: Internal Medicine

## 2022-02-12 ENCOUNTER — Other Ambulatory Visit: Payer: Self-pay

## 2022-02-12 ENCOUNTER — Other Ambulatory Visit: Payer: Self-pay | Admitting: Internal Medicine

## 2022-02-12 MED ORDER — NEBIVOLOL HCL 2.5 MG PO TABS
ORAL_TABLET | ORAL | 1 refills | Status: DC
  Filled 2022-02-12 – 2022-02-16 (×2): qty 45, 90d supply, fill #0
  Filled 2022-04-26 – 2022-05-13 (×2): qty 45, 90d supply, fill #1

## 2022-02-16 ENCOUNTER — Other Ambulatory Visit: Payer: Self-pay

## 2022-02-16 LAB — CYTOLOGY - PAP
Comment: NEGATIVE
Diagnosis: NEGATIVE
High risk HPV: NEGATIVE

## 2022-02-16 NOTE — Telephone Encounter (Signed)
Please confirm with her what dose she needs sent in.  If needs the whole tablet, can resend rx and cancel the 1/2

## 2022-02-21 ENCOUNTER — Encounter: Payer: Self-pay | Admitting: Internal Medicine

## 2022-02-21 DIAGNOSIS — R21 Rash and other nonspecific skin eruption: Secondary | ICD-10-CM | POA: Insufficient documentation

## 2022-02-21 DIAGNOSIS — K649 Unspecified hemorrhoids: Secondary | ICD-10-CM | POA: Insufficient documentation

## 2022-02-21 NOTE — Assessment & Plan Note (Signed)
Taking bystolic and hctz as outline.  Did not tolerated hydralazine.  Follow pressures. Follow metabolic panel.

## 2022-02-21 NOTE — Assessment & Plan Note (Signed)
Saw neurology.  Recommended EEG

## 2022-02-21 NOTE — Assessment & Plan Note (Signed)
Crestor. Low cholesterol diet and exercise.  Follow lipid panel and liver function tests.  

## 2022-02-21 NOTE — Assessment & Plan Note (Signed)
Occasional bleeding.  Request referral to Dr Bary Castilla.

## 2022-02-21 NOTE — Assessment & Plan Note (Signed)
Overall appears to be handling things relatively well.  Follow.   

## 2022-02-21 NOTE — Assessment & Plan Note (Signed)
Nystatin powder.

## 2022-02-21 NOTE — Assessment & Plan Note (Signed)
Schedule mammogram. Phone number given.

## 2022-02-21 NOTE — Assessment & Plan Note (Signed)
Low carb diet and exercise.  On metformin.  Follow met b and a1c.  

## 2022-02-21 NOTE — Assessment & Plan Note (Signed)
Request referral to Dr Bary Castilla for evaluation.

## 2022-02-26 ENCOUNTER — Other Ambulatory Visit: Payer: No Typology Code available for payment source

## 2022-03-04 ENCOUNTER — Other Ambulatory Visit: Payer: Self-pay

## 2022-04-06 ENCOUNTER — Other Ambulatory Visit: Payer: Self-pay | Admitting: General Surgery

## 2022-04-06 DIAGNOSIS — K439 Ventral hernia without obstruction or gangrene: Secondary | ICD-10-CM

## 2022-04-08 NOTE — Addendum Note (Signed)
Addended by: Robert Bellow on: 04/08/2022 02:03 PM   Modules accepted: Orders

## 2022-04-26 ENCOUNTER — Other Ambulatory Visit: Payer: Self-pay | Admitting: Internal Medicine

## 2022-04-26 ENCOUNTER — Other Ambulatory Visit: Payer: Self-pay

## 2022-04-27 ENCOUNTER — Other Ambulatory Visit: Payer: Self-pay

## 2022-04-27 ENCOUNTER — Other Ambulatory Visit: Payer: Self-pay | Admitting: Internal Medicine

## 2022-04-28 ENCOUNTER — Other Ambulatory Visit: Payer: Self-pay | Admitting: Internal Medicine

## 2022-04-28 ENCOUNTER — Other Ambulatory Visit: Payer: Self-pay

## 2022-04-29 ENCOUNTER — Other Ambulatory Visit: Payer: Self-pay

## 2022-04-29 ENCOUNTER — Other Ambulatory Visit: Payer: Self-pay | Admitting: Internal Medicine

## 2022-04-30 ENCOUNTER — Telehealth: Payer: Self-pay | Admitting: Internal Medicine

## 2022-04-30 ENCOUNTER — Other Ambulatory Visit: Payer: Self-pay

## 2022-04-30 DIAGNOSIS — K439 Ventral hernia without obstruction or gangrene: Secondary | ICD-10-CM

## 2022-04-30 DIAGNOSIS — R1084 Generalized abdominal pain: Secondary | ICD-10-CM

## 2022-04-30 MED FILL — Sertraline HCl Tab 50 MG: ORAL | 30 days supply | Qty: 30 | Fill #0 | Status: AC

## 2022-04-30 NOTE — Telephone Encounter (Signed)
Pt advised - agreeable to CT Confirmed no allergy to shellfish/iodine

## 2022-04-30 NOTE — Telephone Encounter (Signed)
Notify Margaret Mercado that DrByrnett had notified me that given insurance issues, he is not the surgeon that would do her surgery.  He had wanted me to schedule a CT of her abd and pelvis to better evaluate her hernia.  If agreeable, let me know and I will order.  Confirm no allergy to shellfish or iodine.

## 2022-05-01 NOTE — Addendum Note (Signed)
Addended by: Alisa Graff on: 05/01/2022 10:44 AM   Modules accepted: Orders

## 2022-05-01 NOTE — Telephone Encounter (Signed)
Order placed for CT scan.  Someone should be calling her with an appt date and time.  Also she confirm no allergy to iodine or shellfish, please als confirm no allergy to contrast dye.  (I forgot to mention to ask about this).

## 2022-05-02 ENCOUNTER — Other Ambulatory Visit: Payer: Self-pay

## 2022-05-03 NOTE — Telephone Encounter (Signed)
Per pt - no allergy to shellfish/iodine/contrast

## 2022-05-13 ENCOUNTER — Other Ambulatory Visit: Payer: Self-pay

## 2022-05-14 ENCOUNTER — Other Ambulatory Visit: Payer: Self-pay

## 2022-05-21 ENCOUNTER — Ambulatory Visit: Admission: RE | Admit: 2022-05-21 | Payer: No Typology Code available for payment source | Source: Ambulatory Visit

## 2022-06-11 ENCOUNTER — Ambulatory Visit: Admission: RE | Admit: 2022-06-11 | Payer: No Typology Code available for payment source | Source: Ambulatory Visit

## 2022-06-11 ENCOUNTER — Ambulatory Visit
Admission: RE | Admit: 2022-06-11 | Discharge: 2022-06-11 | Disposition: A | Discharge status: Home / Self Care | Payer: No Typology Code available for payment source | Source: Ambulatory Visit | Attending: Internal Medicine | Admitting: Internal Medicine

## 2022-06-11 DIAGNOSIS — R1084 Generalized abdominal pain: Secondary | ICD-10-CM | POA: Diagnosis present

## 2022-06-11 DIAGNOSIS — K439 Ventral hernia without obstruction or gangrene: Secondary | ICD-10-CM | POA: Insufficient documentation

## 2022-06-11 LAB — POCT I-STAT CREATININE: Creatinine, Ser: 0.6 mg/dL (ref 0.44–1.00)

## 2022-06-11 MED ORDER — IOHEXOL 300 MG/ML  SOLN
100.0000 mL | Freq: Once | INTRAMUSCULAR | Status: AC | PRN
  Administered 2022-06-11: 100 mL via INTRAVENOUS

## 2022-06-15 ENCOUNTER — Telehealth: Payer: Self-pay | Admitting: *Deleted

## 2022-06-15 DIAGNOSIS — N9489 Other specified conditions associated with female genital organs and menstrual cycle: Secondary | ICD-10-CM

## 2022-06-15 NOTE — Telephone Encounter (Signed)
Received a call from [place] with [facility]:  CT shows a large cystic lesion in the right adnexa measuring 14.5x9.1cm with a associated fluid-filled serpignous tubular structure. Findings are favored to reflect a large right ovarian cystic lesion with hydrosalpinx.   Suggest OBGYN consult & further evaluation by pelvic MRI with and without contrast  I have pended the MRI, but did not finish the order.

## 2022-06-16 NOTE — Telephone Encounter (Signed)
Order placed for CA 125 and for gyn referral.  She has other labs ordered.  Needs all drawn.  Pt prefers to come in Friday 06/18/22 am. Please schedule lab appt and message her with appt date and time.

## 2022-06-17 ENCOUNTER — Telehealth: Payer: Self-pay

## 2022-06-17 ENCOUNTER — Ambulatory Visit: Payer: No Typology Code available for payment source | Admitting: Internal Medicine

## 2022-06-17 NOTE — Telephone Encounter (Signed)
Pt scheduled. Message sent to advise

## 2022-06-17 NOTE — Telephone Encounter (Signed)
Mychart msg sent to pt per Dr Nicki Reaper to advise pt of lab appointment

## 2022-06-18 ENCOUNTER — Other Ambulatory Visit (INDEPENDENT_AMBULATORY_CARE_PROVIDER_SITE_OTHER): Payer: 59

## 2022-06-18 DIAGNOSIS — E1165 Type 2 diabetes mellitus with hyperglycemia: Secondary | ICD-10-CM | POA: Diagnosis not present

## 2022-06-18 DIAGNOSIS — I10 Essential (primary) hypertension: Secondary | ICD-10-CM | POA: Diagnosis not present

## 2022-06-18 DIAGNOSIS — N9489 Other specified conditions associated with female genital organs and menstrual cycle: Secondary | ICD-10-CM

## 2022-06-18 DIAGNOSIS — E785 Hyperlipidemia, unspecified: Secondary | ICD-10-CM

## 2022-06-18 LAB — LIPID PANEL
Cholesterol: 256 mg/dL — ABNORMAL HIGH (ref 0–200)
HDL: 41.6 mg/dL (ref >39.00)
NonHDL: 214.87
Total CHOL/HDL Ratio: 6
Triglycerides: 280 mg/dL — ABNORMAL HIGH (ref 0.0–149.0)
VLDL: 56 mg/dL — ABNORMAL HIGH (ref 0.0–40.0)

## 2022-06-18 LAB — TSH: TSH: 9 u[IU]/mL — ABNORMAL HIGH (ref 0.35–5.50)

## 2022-06-18 LAB — BASIC METABOLIC PANEL
BUN: 15 mg/dL (ref 6–23)
CO2: 26 mEq/L (ref 19–32)
Calcium: 9.4 mg/dL (ref 8.4–10.5)
Chloride: 104 mEq/L (ref 96–112)
Creatinine, Ser: 0.71 mg/dL (ref 0.40–1.20)
GFR: 89.41 mL/min (ref >60.00)
Glucose, Bld: 127 mg/dL — ABNORMAL HIGH (ref 70–99)
Potassium: 4 mEq/L (ref 3.5–5.1)
Sodium: 140 mEq/L (ref 135–145)

## 2022-06-18 LAB — LDL CHOLESTEROL, DIRECT: Direct LDL: 160 mg/dL

## 2022-06-18 LAB — HEPATIC FUNCTION PANEL
ALT: 12 U/L (ref 0–35)
AST: 13 U/L (ref 0–37)
Albumin: 4.1 g/dL (ref 3.5–5.2)
Alkaline Phosphatase: 61 U/L (ref 39–117)
Bilirubin, Direct: 0.1 mg/dL (ref 0.0–0.3)
Total Bilirubin: 0.4 mg/dL (ref 0.2–1.2)
Total Protein: 6.4 g/dL (ref 6.0–8.3)

## 2022-06-18 LAB — HEMOGLOBIN A1C: Hgb A1c MFr Bld: 6.1 % (ref 4.6–6.5)

## 2022-06-18 NOTE — Addendum Note (Signed)
Addended by: Neta Ehlers on: 06/18/2022 07:56 AM   Modules accepted: Orders

## 2022-06-18 NOTE — Addendum Note (Signed)
Addended by: Neta Ehlers on: 06/18/2022 07:59 AM   Modules accepted: Orders

## 2022-06-21 ENCOUNTER — Encounter: Payer: Self-pay | Admitting: Internal Medicine

## 2022-06-21 ENCOUNTER — Other Ambulatory Visit: Payer: Self-pay

## 2022-06-21 DIAGNOSIS — E039 Hypothyroidism, unspecified: Secondary | ICD-10-CM

## 2022-06-21 LAB — CA 125: CA 125: 298 U/mL — ABNORMAL HIGH (ref <35)

## 2022-06-21 MED ORDER — LEVOTHYROXINE SODIUM 50 MCG PO TABS
50.0000 ug | ORAL_TABLET | Freq: Every day | ORAL | 3 refills | Status: AC
  Filled 2022-06-21: qty 90, 90d supply, fill #0
  Filled 2022-09-24: qty 90, 90d supply, fill #1
  Filled 2022-10-04 – 2022-10-27 (×2): qty 90, 90d supply, fill #2

## 2022-06-22 ENCOUNTER — Other Ambulatory Visit: Payer: Self-pay

## 2022-06-22 MED ORDER — ROSUVASTATIN CALCIUM 10 MG PO TABS
10.0000 mg | ORAL_TABLET | Freq: Every day | ORAL | 1 refills | Status: DC
  Filled 2022-06-22: qty 90, 90d supply, fill #0
  Filled 2022-09-24: qty 90, 90d supply, fill #1

## 2022-07-06 ENCOUNTER — Other Ambulatory Visit: Payer: Self-pay | Admitting: Obstetrics and Gynecology

## 2022-07-06 DIAGNOSIS — R971 Elevated cancer antigen 125 [CA 125]: Secondary | ICD-10-CM

## 2022-07-06 DIAGNOSIS — N9489 Other specified conditions associated with female genital organs and menstrual cycle: Secondary | ICD-10-CM | POA: Diagnosis not present

## 2022-07-07 ENCOUNTER — Ambulatory Visit
Admission: RE | Admit: 2022-07-07 | Discharge: 2022-07-07 | Disposition: A | Discharge status: Home / Self Care | Payer: 59 | Source: Ambulatory Visit | Attending: Obstetrics and Gynecology | Admitting: Obstetrics and Gynecology

## 2022-07-07 DIAGNOSIS — R1909 Other intra-abdominal and pelvic swelling, mass and lump: Secondary | ICD-10-CM | POA: Diagnosis not present

## 2022-07-07 DIAGNOSIS — R971 Elevated cancer antigen 125 [CA 125]: Secondary | ICD-10-CM | POA: Insufficient documentation

## 2022-07-07 DIAGNOSIS — N9489 Other specified conditions associated with female genital organs and menstrual cycle: Secondary | ICD-10-CM | POA: Diagnosis not present

## 2022-07-07 DIAGNOSIS — N83291 Other ovarian cyst, right side: Secondary | ICD-10-CM | POA: Diagnosis not present

## 2022-07-08 ENCOUNTER — Telehealth: Payer: Self-pay

## 2022-07-08 NOTE — Telephone Encounter (Signed)
Referral received and records reviewed. Called and left voicemail to return call for scheduling.

## 2022-07-13 MED FILL — Sertraline HCl Tab 50 MG: ORAL | 30 days supply | Qty: 30 | Fill #1 | Status: AC

## 2022-07-14 ENCOUNTER — Inpatient Hospital Stay: Payer: 59 | Attending: Obstetrics and Gynecology | Admitting: Obstetrics and Gynecology

## 2022-07-14 ENCOUNTER — Ambulatory Visit
Admission: RE | Admit: 2022-07-14 | Discharge: 2022-07-14 | Disposition: A | Discharge status: Home / Self Care | Payer: 59 | Source: Ambulatory Visit | Attending: Nurse Practitioner | Admitting: Nurse Practitioner

## 2022-07-14 ENCOUNTER — Inpatient Hospital Stay: Payer: 59

## 2022-07-14 ENCOUNTER — Other Ambulatory Visit: Payer: Self-pay

## 2022-07-14 VITALS — BP 132/66 | HR 68 | Temp 97.8°F | Resp 19 | Wt 198.1 lb

## 2022-07-14 DIAGNOSIS — Z8673 Personal history of transient ischemic attack (TIA), and cerebral infarction without residual deficits: Secondary | ICD-10-CM | POA: Diagnosis not present

## 2022-07-14 DIAGNOSIS — E039 Hypothyroidism, unspecified: Secondary | ICD-10-CM | POA: Diagnosis not present

## 2022-07-14 DIAGNOSIS — Z6838 Body mass index (BMI) 38.0-38.9, adult: Secondary | ICD-10-CM | POA: Insufficient documentation

## 2022-07-14 DIAGNOSIS — R4701 Aphasia: Secondary | ICD-10-CM | POA: Diagnosis not present

## 2022-07-14 DIAGNOSIS — R3915 Urgency of urination: Secondary | ICD-10-CM | POA: Diagnosis not present

## 2022-07-14 DIAGNOSIS — N9489 Other specified conditions associated with female genital organs and menstrual cycle: Secondary | ICD-10-CM

## 2022-07-14 DIAGNOSIS — R0609 Other forms of dyspnea: Secondary | ICD-10-CM | POA: Insufficient documentation

## 2022-07-14 DIAGNOSIS — Z79899 Other long term (current) drug therapy: Secondary | ICD-10-CM | POA: Insufficient documentation

## 2022-07-14 DIAGNOSIS — I10 Essential (primary) hypertension: Secondary | ICD-10-CM | POA: Diagnosis not present

## 2022-07-14 DIAGNOSIS — Z79890 Hormone replacement therapy (postmenopausal): Secondary | ICD-10-CM | POA: Insufficient documentation

## 2022-07-14 DIAGNOSIS — R971 Elevated cancer antigen 125 [CA 125]: Secondary | ICD-10-CM | POA: Diagnosis not present

## 2022-07-14 DIAGNOSIS — R194 Change in bowel habit: Secondary | ICD-10-CM | POA: Diagnosis not present

## 2022-07-14 DIAGNOSIS — E669 Obesity, unspecified: Secondary | ICD-10-CM | POA: Insufficient documentation

## 2022-07-14 DIAGNOSIS — K439 Ventral hernia without obstruction or gangrene: Secondary | ICD-10-CM

## 2022-07-14 DIAGNOSIS — Z01818 Encounter for other preprocedural examination: Secondary | ICD-10-CM | POA: Diagnosis not present

## 2022-07-14 DIAGNOSIS — I361 Nonrheumatic tricuspid (valve) insufficiency: Secondary | ICD-10-CM | POA: Diagnosis not present

## 2022-07-14 DIAGNOSIS — E785 Hyperlipidemia, unspecified: Secondary | ICD-10-CM | POA: Insufficient documentation

## 2022-07-14 DIAGNOSIS — R011 Cardiac murmur, unspecified: Secondary | ICD-10-CM | POA: Diagnosis not present

## 2022-07-14 DIAGNOSIS — E1165 Type 2 diabetes mellitus with hyperglycemia: Secondary | ICD-10-CM | POA: Insufficient documentation

## 2022-07-14 DIAGNOSIS — D3911 Neoplasm of uncertain behavior of right ovary: Secondary | ICD-10-CM | POA: Insufficient documentation

## 2022-07-14 LAB — CBC WITH DIFFERENTIAL/PLATELET
Abs Immature Granulocytes: 0.02 10*3/uL (ref 0.00–0.07)
Basophils Absolute: 0.1 10*3/uL (ref 0.0–0.1)
Basophils Relative: 1 %
Eosinophils Absolute: 0.2 10*3/uL (ref 0.0–0.5)
Eosinophils Relative: 3 %
HCT: 38.3 % (ref 36.0–46.0)
Hemoglobin: 12.9 g/dL (ref 12.0–15.0)
Immature Granulocytes: 0 %
Lymphocytes Relative: 26 %
Lymphs Abs: 1.7 10*3/uL (ref 0.7–4.0)
MCH: 31.8 pg (ref 26.0–34.0)
MCHC: 33.7 g/dL (ref 30.0–36.0)
MCV: 94.3 fL (ref 80.0–100.0)
Monocytes Absolute: 0.4 10*3/uL (ref 0.1–1.0)
Monocytes Relative: 6 %
Neutro Abs: 4.1 10*3/uL (ref 1.7–7.7)
Neutrophils Relative %: 64 %
Platelets: 219 10*3/uL (ref 150–400)
RBC: 4.06 MIL/uL (ref 3.87–5.11)
RDW: 12.8 % (ref 11.5–15.5)
WBC: 6.4 10*3/uL (ref 4.0–10.5)
nRBC: 0 % (ref 0.0–0.2)

## 2022-07-14 LAB — TSH: TSH: 1.194 u[IU]/mL (ref 0.350–4.500)

## 2022-07-14 NOTE — Progress Notes (Signed)
Gynecologic Oncology Consult Visit   Referring Provider: Dr. Leafy Ro  Chief Complaint: adnexal Mass, elevated ca 125  Subjective:  Margaret Mercado is a 67 y.o. female who is seen in consultation from Dr. Leafy Ro for adnexal mass and elevated ca 125.   Patient was seen by PCP for abdominal pain that was thought to be related to a known hernia. 06/11/22 CT showed adnexal mass on right measuring 14.5 x 9.1 cm with fluid filled serpiginous tubular structure. Possible focal fluid in the fundal portion of the endometrial canal. Large fat containing ventral hernia with a 2cm aperture width. She was seen by Dr. Leafy Ro.   06/18/22- CA 125: 298.   07/07/22- US Pelvis Uterus: Measurements: 8.6 x 5.4 x 5.8 cm = volume: 140 mL Endometrium: 10.3 mm.  Right ovary: not visualized. 16.1 x 9.9 x 9.3 cm cystic right adnexal mass incompletely visualized Left ovary: not visualized No abnormal free fluid.   06/18/22 HmgA1c - 6.1  She has noticed some changes in caliber of stool. She has urinary urgency at baseline but feels it is worse over past year.   Problem List: Patient Active Problem List   Diagnosis Date Noted   Rash 02/21/2022   Hemorrhoids 02/21/2022   Encounter for completion of form with patient 09/24/2021   Headache 09/23/2021   Expressive aphasia 09/13/2021   Breast cancer screening 02/08/2021   Hematoma 01/18/2021   Leg pain 01/18/2021   Type 2 diabetes mellitus with hyperglycemia ([place]) 05/27/2020   Tricuspid valve insufficiency 03/28/2019   Heart murmur 11/08/2018   History of TIA (transient ischemic attack) 11/08/2018   Abnormal mammogram of right breast 05/03/2018   DOE (dyspnea on exertion) 12/13/2017   Stress 12/13/2017   Hyperglycemia 12/12/2017   Healthcare maintenance 05/17/2016   Abdominal pain, left lower quadrant 08/19/2013   Ventral hernia 08/19/2013   Pyuria 08/19/2013   Sleep apnea 06/17/2012   FLUSHING 02/14/2009   Hyperlipidemia LDL goal <70 01/30/2009    Essential hypertension 01/30/2009   CARDIAC ARRHYTHMIA 01/30/2009   Transient cerebral ischemia 01/30/2009    Past Medical History: Past Medical History:  Diagnosis Date   Abnormal echocardiogram 01/2009   Mild LVH, EF >55%, no regional wall motion abnormalities, normal RV, mild aortic insufficiency, no aortic stenosis.   Anxiety    Heart murmur    Hyperlipidemia    Hypertension    Edema with Norvasc. Unable to tolerate Lisinopril/ HCTZ.   Hypokalemia    Manifested by lip and finger tingling   Palpitations    Event monitor 01/2009 with no significant arrhythmias   TIA (transient ischemic attack) 01/2009   CT and MRI of head showed no evidence for stroke. Carotid dopplers showed no significant plaque   Ventral hernia     Past Surgical History: Past Surgical History:  Procedure Laterality Date   CRYOABLATION     Of uterus   UMBILICAL HERNIA REPAIR  2010   Dr Nicholes Stairs   URETHRAL DILATION     removal of bladder polyps    Past Gynecologic History:  Menarche: age 52 Post menopausal   OB History:  OB History  No obstetric history on file.  G2P2  Family History: Family History  Problem Relation Age of Onset   Hypertension Mother    Arrhythmia Mother        Atrial fibrillation   Hypertension Father    Valvular heart disease Father    Hypertension Brother    Hypertension Paternal Grandmother    Coronary artery  disease Neg Hx        Premature   Breast cancer Neg Hx    Colon cancer Neg Hx     Social History: Social History   Socioeconomic History   Marital status: Married    Spouse name: Not on file   Number of children: 2   Years of education: Not on file   Highest education level: Not on file  Occupational History   Occupation: Horticulturist, commercial    Comment: full time  Tobacco Use   Smoking status: Never   Smokeless tobacco: Never  Vaping Use   Vaping Use: Never used  Substance and Sexual Activity   Alcohol use: No    Alcohol/week: 0.0 standard  drinks of alcohol   Drug use: No   Sexual activity: Yes    Birth control/protection: None  Other Topics Concern   Not on file  Social History Narrative   Married   Gets regular exercise   Social Determinants of Health   Financial Resource Strain: Not on file  Food Insecurity: Not on file  Transportation Needs: Not on file  Physical Activity: Not on file  Stress: Not on file  Social Connections: Not on file  Intimate Partner Violence: Not on file    Allergies: Allergies  Allergen Reactions   Vicodin [Hydrocodone-Acetaminophen] Anaphylaxis   Amlodipine Besylate    Cardizem [Diltiazem Hcl] Other (See Comments)    Flushing    Erythromycin Other (See Comments)    Chest heaviness    Hydrocodone-Acetaminophen    Levaquin [Levofloxacin In D5w] Other (See Comments)    Rapid heart beat   Lisinopril Other (See Comments)    Difficulty breathing and rash   Macrobid [Nitrofurantoin Macrocrystal] Other (See Comments)    Wheezing and headache   Oxycodone-Acetaminophen    Penicillins Rash   Sulfonamide Derivatives Rash    Current Medications: Current Outpatient Medications  Medication Sig Dispense Refill   aspirin 81 MG EC tablet Take 81 mg by mouth daily.       ergocalciferol (VITAMIN D2) 1.25 MG (50000 UT) capsule TAKE 1 CAPSULE BY MOUTH ONCE A WEEK FOR 8 DOSES 8 capsule 0   hydrALAZINE (APRESOLINE) 25 MG tablet Take 25 mg by mouth 2 (two) times daily.     hydrochlorothiazide (MICROZIDE) 12.5 MG capsule Take 1 capsule (12.5 mg total) by mouth daily. 30 capsule 1   levothyroxine (SYNTHROID) 50 MCG tablet Take 1 tablet (50 mcg total) by mouth daily. 90 tablet 3   nebivolol (BYSTOLIC) 2.5 MG tablet TAKE 1/2 TABLET BY MOUTH DAILY 45 tablet 1   rosuvastatin (CRESTOR) 10 MG tablet Take 1 tablet (10 mg total) by mouth daily. 90 tablet 1   sertraline (ZOLOFT) 50 MG tablet Take 1 tablet (50 mg total) by mouth daily. 30 tablet 3   HYDROcodone-acetaminophen (HYCET) 7.5-325 mg/15 ml  solution Take 25 mg by mouth 2 (two) times daily. (Patient not taking: Reported on 07/14/2022)     meloxicam (MOBIC) 7.5 MG tablet Take 7.5 mg by mouth daily. (Patient not taking: Reported on 07/14/2022)     nystatin (MYCOSTATIN/NYSTOP) powder Apply 1 Application topically 2 (two) times daily. (Patient not taking: Reported on 07/14/2022) 60 g 0   No current facility-administered medications for this visit.    Review of Systems General: negative for fevers, changes in weight or night sweats Skin: negative for changes in moles or sores or rash Eyes: negative for changes in vision HEENT: negative for change in hearing, tinnitus, voice changes  Pulmonary: negative for dyspnea, orthopnea, productive cough, wheezing Cardiac: negative for palpitations, pain Gastrointestinal: negative for nausea, vomiting, constipation, diarrhea, hematemesis, hematochezia Genitourinary/Sexual: negative for dysuria, retention, hematuria, incontinence Ob/Gyn:  negative for abnormal bleeding, or pain Musculoskeletal: negative for pain, joint pain, back pain Hematology: negative for easy bruising, abnormal bleeding Neurologic/Psych: negative for headaches, seizures, paralysis, weakness, numbness   Objective:  Physical Examination:  BP 132/66   Pulse 68   Temp 97.8 F (36.6 C)   Resp 19   Wt 198 lb 1.6 oz (89.9 kg)   SpO2 98%   BMI 38.69 kg/m     Performance status = 1   GENERAL: Patient is a well appearing female in no acute distress HEENT:  PERRL, neck supple with midline trachea.  NODES:  No cervical, supraclavicular, axillary, or inguinal lymphadenopathy palpated.  LUNGS:  Normal respiratory  ABDOMEN:  Soft, nontender, nondistended. Large pelvic mass extending from the pelvis to the umbilicus. 4-5 cm supraumbilical mass due to hernia. Non-reducible. Well healed transverse incision. No ascites or hepatosplenomegaly appreciated.  BACK: No spinal tenderness. No CVAT.  EXTREMITIES:  No peripheral edema.    NEURO:  Nonfocal. Well oriented.  Appropriate affect.  Pelvic: EGBUS: no lesions Cervix: unable to visualize given mass.  Vagina: no lesions, no discharge or bleeding Uterus: unable to determine size due to the mass; nontender Adnexa: Large palpable mass on the right standing into the cul-de-sac.  Mobile on palpation.  Left adnexa is unremarkable Rectovaginal: confirmatory.  No evidence of rectal mucosal involvement    Lab Review Labs on site today: CBC and TSH ordered  Lab Results  Component Value Date   WBC 6.5 09/21/2021   HGB 13.5 09/21/2021   HCT 41.1 09/21/2021   MCV 94.3 09/21/2021   PLT 260 09/21/2021     Chemistry      Component Value Date/Time   NA 140 06/18/2022 0738   K 4.0 06/18/2022 0738   CL 104 06/18/2022 0738   CO2 26 06/18/2022 0738   BUN 15 06/18/2022 0738   CREATININE 0.71 06/18/2022 0738      Component Value Date/Time   CALCIUM 9.4 06/18/2022 0738   ALKPHOS 61 06/18/2022 0738   AST 13 06/18/2022 0738   ALT 12 06/18/2022 0738   BILITOT 0.4 06/18/2022 0738     Lab Results  Component Value Date   TSH 9.00 (H) 06/18/2022     Radiologic Imaging: 06/11/2022 Narrative & Impression  CLINICAL DATA:  Hernia suspected, abdominal wall increases postprandial and with exertion   EXAM: CT ABDOMEN AND PELVIS WITH CONTRAST   TECHNIQUE: Multidetector CT imaging of the abdomen and pelvis was performed using the standard protocol following bolus administration of intravenous contrast.   RADIATION DOSE REDUCTION: This exam was performed according to the departmental dose-optimization program which includes automated exposure control, adjustment of the mA and/or kV according to patient size and/or use of iterative reconstruction technique.   CONTRAST:  186m OMNIPAQUE IOHEXOL 300 MG/ML  SOLN   COMPARISON:  CT March 11, 2005   FINDINGS: Lower chest: Solid 3 mm subpleural left lower lobe pulmonary nodule on image 6/3 stable dating back to  March 11, 2005 compatible with a benign finding.   Hepatobiliary: No suspicious hepatic lesion. Gallbladder is unremarkable. No biliary ductal dilation.   Pancreas: No pancreatic ductal dilation or evidence of acute inflammation.   Spleen: No splenomegaly.   Adrenals/Urinary Tract: Bilateral adrenal glands appear normal. Mild prominence of the right collecting system and ureter to  the level of a large cystic lesion in the right adnexa left kidney is unremarkable. Urinary bladder is unremarkable for degree of distension.   Stomach/Bowel: Stomach is unremarkable for degree of distension. No pathologic dilation of small or large bowel. Left-sided colonic diverticulosis without findings of acute diverticulitis.   Vascular/Lymphatic: Aortic atherosclerosis. No pathologically enlarged abdominal pelvic lymph nodes.   Reproductive: Large cystic lesion in the right adnexa measures 14.5 x 9.1 cm on image 64/2 with a associated fluid-filled serpiginous tubular structure. Possible focal fluid in the fundal portion of the endometrial canal. Left adnexa is unremarkable.   Other: Large fat containing ventral hernia with a 2 cm aperture width.   Musculoskeletal: Multilevel degenerative changes spine. Degenerative change of the bilateral hips.   IMPRESSION: 1. Large cystic lesion in the right adnexa measuring 14.5 x 9.1 cm with a associated fluid-filled serpiginous tubular structure. Findings are favored to reflect a large right ovarian cystic lesion with hydrosalpinx. Suggest Ob-gyn consult and further evaluation by pelvic MRI with and without contrast. 2. Mild prominence of the right collecting system and ureter to the level of the large cystic lesion in the right adnexa but with symmetric enhancement and excretion of contrast indicative of a minimally obstructive process. 3. Large fat containing ventral hernia with a 2 cm aperture width. 4.  Aortic Atherosclerosis (ICD10-I70.0).    These results will be called to the ordering clinician or representative by the Radiologist Assistant, and communication documented in the PACS or [employer].     Electronically Signed   By: Dahlia Bailiff M.D.   On: 06/12/2022 13:28      Assessment:  GALA PADOVANO is a 67 y.o. female diagnosed with large complex adnexal mass with elevated CA125 and abnormal uterine finding on CT scan. Differential diagnosis includes benign versus malignant ovarian cancer; endometrial malignancy given abnormal uterine findings on imaging with either metastatic disease to the ovary or benign ovarian cystic mass.   Symptomatic nonreducible hernia s/p prior hernia repair  Hypothyroidism, on replacement, asymptomatic  Medical co-morbidities complicating care: Body mass index is 38.69 kg/m. Mild LVH, HTN, h/o TIA 2010  Plan:   Problem List Items Addressed This Visit       Other   Ventral hernia   Relevant Orders   Ambulatory referral to General Surgery   Other Visit Diagnoses     Adnexal mass    -  Primary   Relevant Orders   CBC with Differential/Platelet   TSH   DG Chest 2 View   EKG 12-Lead   Ambulatory referral to General Surgery      We discussed options for management and recommended surgery.  We do need to coordinate surgical team and we referred her to Dr. Christian Mate for evaluation of the hernia and hernia repair options.  Typically when hysterectomy is performed I prefer to avoid permanent mesh but will defer to Dr. Christian Mate regarding this issue. I will also reach out to Dr. Leafy Ro and her team for assistance.  If possible we can attempt a minimally invasive approach with a hand-assisted port and either robotic or laparoscopy.  Control drainage would need to be performed in order to avoid laparotomy.  In addition laparotomy may be required in order to perform the procedure.  She does have medical issues however hypertension seems to be a well controlled.  Recent diagnosis of  hypothyroidism now on replacement with no concerning symptoms -repeat TSH ordered.  History of mild LVH and TIA no concerning current symptoms.  Preoperative CBC, EKG and chest x-ray will be obtained  Suggested return to clinic in  2 weeks.  At that visit we will finalize all surgical plans and review the consent.  Tentative plan is for surgery on August 04, 2022.  The patient's diagnosis, an outline of the further diagnostic and laboratory studies which will be required, the recommendation for surgery, and alternatives were discussed with her and her accompanying family members.  All questions were answered to their satisfaction.  A total of 80 minutes were spent with the patient/family today; >50% was spent in education, counseling and coordination of care for large complex adnexal mass with elevated CA125 and abnormal uterine finding on CT scan, and ventral hernia.   Ang[place] Gaetana Michaelis, MD    CC:  Dr. Leafy Ro

## 2022-07-15 ENCOUNTER — Encounter: Payer: Self-pay | Admitting: Internal Medicine

## 2022-07-15 ENCOUNTER — Ambulatory Visit (INDEPENDENT_AMBULATORY_CARE_PROVIDER_SITE_OTHER): Payer: 59 | Admitting: Internal Medicine

## 2022-07-15 VITALS — BP 150/90 | HR 80 | Temp 97.8°F | Resp 16 | Ht 61.0 in | Wt 195.2 lb

## 2022-07-15 DIAGNOSIS — Z01818 Encounter for other preprocedural examination: Secondary | ICD-10-CM

## 2022-07-15 DIAGNOSIS — E1165 Type 2 diabetes mellitus with hyperglycemia: Secondary | ICD-10-CM | POA: Diagnosis not present

## 2022-07-15 DIAGNOSIS — E785 Hyperlipidemia, unspecified: Secondary | ICD-10-CM

## 2022-07-15 DIAGNOSIS — K439 Ventral hernia without obstruction or gangrene: Secondary | ICD-10-CM

## 2022-07-15 DIAGNOSIS — I10 Essential (primary) hypertension: Secondary | ICD-10-CM

## 2022-07-15 DIAGNOSIS — F439 Reaction to severe stress, unspecified: Secondary | ICD-10-CM | POA: Diagnosis not present

## 2022-07-15 DIAGNOSIS — N9489 Other specified conditions associated with female genital organs and menstrual cycle: Secondary | ICD-10-CM | POA: Diagnosis not present

## 2022-07-15 DIAGNOSIS — G4730 Sleep apnea, unspecified: Secondary | ICD-10-CM | POA: Diagnosis not present

## 2022-07-15 DIAGNOSIS — R739 Hyperglycemia, unspecified: Secondary | ICD-10-CM

## 2022-07-15 NOTE — Progress Notes (Signed)
Subjective:    Patient ID: Margaret Mercado, female    DOB: 1955/08/12, 67 y.o.   MRN: [id number]  Patient here for  Chief Complaint  Patient presents with   Medical Management of Chronic Issues    HPI Here to follow up regarding hypertension, hypercholesterolemia and recent finding of adnexal mass.  Just saw Dr Theora Gianotti yesterday.  Planning for surgery - coordinate hernia repair.  Discussed today.  She denies any chest pain or sob.  No cough or congestion.  Some intermittent diarrhea.  States may have loose stool every 2-3 days.  Discussed benefiber. She is walking.  Taking her blood pressure medication qod.  Tolerating and reports is controlling her blood pressure.     Past Medical History:  Diagnosis Date   Abnormal echocardiogram 01/2009   Mild LVH, EF >55%, no regional wall motion abnormalities, normal RV, mild aortic insufficiency, no aortic stenosis.   Anxiety    Heart murmur    Hyperlipidemia    Hypertension    Edema with Norvasc. Unable to tolerate Lisinopril/ HCTZ.   Hypokalemia    Manifested by lip and finger tingling   Palpitations    Event monitor 01/2009 with no significant arrhythmias   TIA (transient ischemic attack) 01/2009   CT and MRI of head showed no evidence for stroke. Carotid dopplers showed no significant plaque   Ventral hernia    Past Surgical History:  Procedure Laterality Date   CRYOABLATION     Of uterus   UMBILICAL HERNIA REPAIR  2010   Dr Nicholes Stairs   URETHRAL DILATION     removal of bladder polyps   Family History  Problem Relation Age of Onset   Hypertension Mother    Arrhythmia Mother        Atrial fibrillation   Hypertension Father    Valvular heart disease Father    Hypertension Brother    Hypertension Paternal Grandmother    Coronary artery disease Neg Hx        Premature   Breast cancer Neg Hx    Colon cancer Neg Hx    Social History   Socioeconomic History   Marital status: Married    Spouse name: Not on file   Number of  children: 2   Years of education: Not on file   Highest education level: Not on file  Occupational History   Occupation: Horticulturist, commercial    Comment: full time  Tobacco Use   Smoking status: Never   Smokeless tobacco: Never  Vaping Use   Vaping Use: Never used  Substance and Sexual Activity   Alcohol use: No    Alcohol/week: 0.0 standard drinks of alcohol   Drug use: No   Sexual activity: Yes    Birth control/protection: None  Other Topics Concern   Not on file  Social History Narrative   Married   Gets regular exercise   Social Determinants of Health   Financial Resource Strain: Not on file  Food Insecurity: Not on file  Transportation Needs: Not on file  Physical Activity: Not on file  Stress: Not on file  Social Connections: Not on file     Review of Systems  Constitutional:  Negative for appetite change and unexpected weight change.  HENT:  Negative for congestion and sinus pressure.   Respiratory:  Negative for cough, chest tightness and shortness of breath.   Cardiovascular:  Negative for chest pain, palpitations and leg swelling.  Gastrointestinal:  Negative for nausea and vomiting.  Does report some diarrhea as outlined.  Bloating.    Genitourinary:  Negative for difficulty urinating and dysuria.  Musculoskeletal:  Negative for joint swelling and myalgias.  Skin:  Negative for color change and rash.  Neurological:  Negative for dizziness and headaches.  Psychiatric/Behavioral:  Negative for agitation and dysphoric mood.        Objective:     BP (!) 150/90   Pulse 80   Temp 97.8 F (36.6 C)   Resp 16   Ht '5\' 1"'$  (1.549 m)   Wt 195 lb 3.2 oz (88.5 kg)   SpO2 97%   BMI 36.88 kg/m  Wt Readings from Last 3 Encounters:  07/15/22 195 lb 3.2 oz (88.5 kg)  07/14/22 198 lb 1.6 oz (89.9 kg)  02/10/22 200 lb (90.7 kg)    Physical Exam Vitals reviewed.  Constitutional:      General: She is not in acute distress.    Appearance: Normal  appearance.  HENT:     Head: Normocephalic and atraumatic.     Right Ear: External ear normal.     Left Ear: External ear normal.  Eyes:     General: No scleral icterus.       Right eye: No discharge.        Left eye: No discharge.     Conjunctiva/sclera: Conjunctivae normal.  Neck:     Thyroid: No thyromegaly.  Cardiovascular:     Rate and Rhythm: Normal rate and regular rhythm.  Pulmonary:     Effort: No respiratory distress.     Breath sounds: Normal breath sounds. No wheezing.  Abdominal:     General: Bowel sounds are normal.     Palpations: Abdomen is soft.     Tenderness: There is no abdominal tenderness.  Musculoskeletal:        General: No swelling or tenderness.     Cervical back: Neck supple. No tenderness.  Lymphadenopathy:     Cervical: No cervical adenopathy.  Skin:    Findings: No erythema or rash.  Neurological:     Mental Status: She is alert.  Psychiatric:        Mood and Affect: Mood normal.        Behavior: Behavior normal.      Outpatient Encounter Medications as of 07/15/2022  Medication Sig   aspirin 81 MG EC tablet Take 81 mg by mouth daily.     hydrochlorothiazide (MICROZIDE) 12.5 MG capsule Take 1 capsule (12.5 mg total) by mouth daily.   levothyroxine (SYNTHROID) 50 MCG tablet Take 1 tablet (50 mcg total) by mouth daily.   nebivolol (BYSTOLIC) 2.5 MG tablet TAKE 1/2 TABLET BY MOUTH DAILY   rosuvastatin (CRESTOR) 10 MG tablet Take 1 tablet (10 mg total) by mouth daily.   sertraline (ZOLOFT) 50 MG tablet Take 1 tablet (50 mg total) by mouth daily.   [DISCONTINUED] ergocalciferol (VITAMIN D2) 1.25 MG (50000 UT) capsule TAKE 1 CAPSULE BY MOUTH ONCE A WEEK FOR 8 DOSES   [DISCONTINUED] hydrALAZINE (APRESOLINE) 25 MG tablet Take 25 mg by mouth 2 (two) times daily.   [DISCONTINUED] HYDROcodone-acetaminophen (HYCET) 7.5-325 mg/15 ml solution Take 25 mg by mouth 2 (two) times daily. (Patient not taking: Reported on 07/14/2022)   [DISCONTINUED] meloxicam  (MOBIC) 7.5 MG tablet Take 7.5 mg by mouth daily. (Patient not taking: Reported on 07/14/2022)   [DISCONTINUED] nystatin (MYCOSTATIN/NYSTOP) powder Apply 1 Application topically 2 (two) times daily. (Patient not taking: Reported on 07/14/2022)   No facility-administered encounter medications  on file as of 07/15/2022.     Lab Results  Component Value Date   WBC 6.4 07/14/2022   HGB 12.9 07/14/2022   HCT 38.3 07/14/2022   PLT 219 07/14/2022   GLUCOSE 127 (H) 06/18/2022   CHOL 256 (H) 06/18/2022   TRIG 280.0 (H) 06/18/2022   HDL 41.60 06/18/2022   LDLDIRECT 160.0 06/18/2022   LDLCALC 43 04/25/2020   ALT 12 06/18/2022   AST 13 06/18/2022   NA 140 06/18/2022   K 4.0 06/18/2022   CL 104 06/18/2022   CREATININE 0.71 06/18/2022   BUN 15 06/18/2022   CO2 26 06/18/2022   TSH 1.194 07/14/2022   INR 1.0 02/26/2021   HGBA1C 6.1 06/18/2022   MICROALBUR 0.7 09/23/2021    DG Chest 2 View  Result Date: 07/15/2022 CLINICAL DATA:  Pelvic mass preop imaging EXAM: CHEST - 2 VIEW COMPARISON:  01/21/2009 FINDINGS: Stable cardiomediastinal silhouette. Aortic atherosclerotic calcification. No focal consolidation, pleural effusion, or pneumothorax. No acute osseous abnormality. IMPRESSION: No active cardiopulmonary disease. Electronically Signed   By: Placido Sou M.D.   On: 07/15/2022 02:24       Assessment & Plan:  Pre-op evaluation -     EKG 12-Lead  Essential hypertension Assessment & Plan: Has not been taking bystolic regularly.  Taking on averaging qod.  States this dosing controls her blood pressure.  Follow pressures.  Send in readings. If persistent elevation may have to go back to daily dosing. Follow pressures and pulse.    Hyperglycemia Assessment & Plan: Low carb diet and exercise.  Follow met b and a1c.    Hyperlipidemia LDL goal <70 Assessment & Plan: Crestor.   Low cholesterol diet and exercise.  Follow lipid panel and liver function tests.     Stress Assessment &  Plan: Overall appears to be handling things relatively well.  Follow.    Type 2 diabetes mellitus with hyperglycemia, without long-term current use of insulin (HCC) Assessment & Plan: Low carb diet and exercise.  Follow met b and a1c.    Ventral hernia without obstruction or gangrene Assessment & Plan: Planning for repair when has gyn surgery.  Planning to see surgery.     Adnexal mass Assessment & Plan: Planning for removal.  Saw gyn. EKG - SR - no acute ischemic changes.  Will need close intra op and post op monitoring of her blood pressure and pulse to avoid extremes.     Sleep apnea, unspecified type Assessment & Plan: Has a history of sleep apnea.  Untreated sleep apnea.  She has previously declined further testing.        Einar Pheasant, MD

## 2022-07-18 ENCOUNTER — Encounter: Payer: Self-pay | Admitting: Internal Medicine

## 2022-07-18 DIAGNOSIS — N9489 Other specified conditions associated with female genital organs and menstrual cycle: Secondary | ICD-10-CM | POA: Insufficient documentation

## 2022-07-18 NOTE — Assessment & Plan Note (Signed)
Has a history of sleep apnea.  Untreated sleep apnea.  She has previously declined further testing.

## 2022-07-18 NOTE — Assessment & Plan Note (Signed)
Crestor. Low cholesterol diet and exercise.  Follow lipid panel and liver function tests.  

## 2022-07-18 NOTE — Assessment & Plan Note (Addendum)
Low carb diet and exercise.  Follow met b and a1c.   

## 2022-07-18 NOTE — Assessment & Plan Note (Signed)
Low carb diet and exercise.  Follow met b and a1c.   

## 2022-07-18 NOTE — Assessment & Plan Note (Addendum)
Planning for removal.  Saw gyn. EKG - SR - no acute ischemic changes.  Will need close intra op and post op monitoring of her blood pressure and pulse to avoid extremes.

## 2022-07-18 NOTE — Assessment & Plan Note (Signed)
Planning for repair when has gyn surgery.  Planning to see surgery.

## 2022-07-18 NOTE — Assessment & Plan Note (Signed)
Overall appears to be handling things relatively well.  Follow.   

## 2022-07-18 NOTE — Assessment & Plan Note (Signed)
Has not been taking bystolic regularly.  Taking on averaging qod.  States this dosing controls her blood pressure.  Follow pressures.  Send in readings. If persistent elevation may have to go back to daily dosing. Follow pressures and pulse.

## 2022-07-19 NOTE — Progress Notes (Unsigned)
Patient ID: MARIGOLD MOM, female   DOB: 09-Nov-1955, 67 y.o.   MRN: [id number]  Chief Complaint:  Recurrent ventral hernia  History of Present Illness Margaret Mercado is a 67 y.o. female with a reported recurrent AA wall hernia, likely initially incisional, repaired reportedly with mesh in 2008.  Repair seemed to have lasted a few years, however she sought repair during [place] in 2020.  It was reducible until the last 6 mos, and she reports has grown larger.  In evaluating with CT scan while planning repair in 2023, she was noted to have a rather large adnexal/ovarian mass.  Now in planning resection of this cystic mass, we are coordinating repair of the recurrent hernia, potentially with mesh resection.     Past Medical History Past Medical History:  Diagnosis Date   Abnormal echocardiogram 01/2009   Mild LVH, EF >55%, no regional wall motion abnormalities, normal RV, mild aortic insufficiency, no aortic stenosis.   Anxiety    Heart murmur    Hyperlipidemia    Hypertension    Edema with Norvasc. Unable to tolerate Lisinopril/ HCTZ.   Hypokalemia    Manifested by lip and finger tingling   Palpitations    Event monitor 01/2009 with no significant arrhythmias   TIA (transient ischemic attack) 01/2009   CT and MRI of head showed no evidence for stroke. Carotid dopplers showed no significant plaque   Ventral hernia       Past Surgical History:  Procedure Laterality Date   CRYOABLATION     Of uterus   UMBILICAL HERNIA REPAIR  2010   Dr Nicholes Stairs   URETHRAL DILATION     removal of bladder polyps    Allergies  Allergen Reactions   Vicodin [Hydrocodone-Acetaminophen] Anaphylaxis   Amlodipine Besylate    Cardizem [Diltiazem Hcl] Other (See Comments)    Flushing    Erythromycin Other (See Comments)    Chest heaviness    Hydrocodone-Acetaminophen    Levaquin [Levofloxacin In D5w] Other (See Comments)    Rapid heart beat   Lisinopril Other (See Comments)    Difficulty breathing  and rash   Macrobid [Nitrofurantoin Macrocrystal] Other (See Comments)    Wheezing and headache   Oxycodone-Acetaminophen    Penicillins Rash   Sulfonamide Derivatives Rash    Current Outpatient Medications  Medication Sig Dispense Refill   aspirin 81 MG EC tablet Take 81 mg by mouth daily.       hydrochlorothiazide (MICROZIDE) 12.5 MG capsule Take 1 capsule (12.5 mg total) by mouth daily. 30 capsule 1   levothyroxine (SYNTHROID) 50 MCG tablet Take 1 tablet (50 mcg total) by mouth daily. 90 tablet 3   nebivolol (BYSTOLIC) 2.5 MG tablet TAKE 1/2 TABLET BY MOUTH DAILY 45 tablet 1   rosuvastatin (CRESTOR) 10 MG tablet Take 1 tablet (10 mg total) by mouth daily. 90 tablet 1   sertraline (ZOLOFT) 50 MG tablet Take 1 tablet (50 mg total) by mouth daily. 30 tablet 3   No current facility-administered medications for this visit.    Family History Family History  Problem Relation Age of Onset   Hypertension Mother    Arrhythmia Mother        Atrial fibrillation   Hypertension Father    Valvular heart disease Father    Hypertension Brother    Hypertension Paternal Grandmother    Coronary artery disease Neg Hx        Premature   Breast cancer Neg Hx    Colon  cancer Neg Hx       Social History Social History   Tobacco Use   Smoking status: Never   Smokeless tobacco: Never  Vaping Use   Vaping Use: Never used  Substance Use Topics   Alcohol use: No    Alcohol/week: 0.0 standard drinks of alcohol   Drug use: No        Review of Systems  Constitutional: Negative.   HENT: Negative.    Eyes: Negative.   Respiratory: Negative.    Cardiovascular: Negative.   Gastrointestinal:  Positive for constipation and diarrhea. Negative for blood in stool and melena.  Genitourinary:  Positive for frequency and urgency.  Skin: Negative.   Neurological: Negative.   Psychiatric/Behavioral: Negative.       Physical Exam Blood pressure (!) 152/76, pulse 66, temperature 97.8 F (36.6  C), temperature source Oral, height '5\' 1"'$  (1.549 m), weight 198 lb 3.2 oz (89.9 kg), SpO2 96 %. Last Weight  Most recent update: 07/20/2022  3:22 PM    Weight  89.9 kg (198 lb 3.2 oz)             CONSTITUTIONAL: Well developed, and nourished, appropriately responsive and aware without distress.  Obese body habitus.   EYES: Sclera non-icteric.   EARS, NOSE, MOUTH AND THROAT: The oropharynx is clear. Oral mucosa is pink and moist.    Hearing is intact to voice.  NECK: Trachea is midline, and there is no jugular venous distension.  LYMPH NODES:  Lymph nodes in the neck are not appreciated. RESPIRATORY:  Lungs are clear, and breath sounds are equal bilaterally.  Normal respiratory effort without pathologic use of accessory muscles. CARDIOVASCULAR: Heart is regular in rate and rhythm.   Well perfused.  GI: The abdomen is obese and notable for a transverse epigastric scar with underlying mass c/w incarcerated hernia,  I cannot quantify the underlying fascial defect.  No appreciable diastasis.  Soft, non-tender.  I did not appreciate hepatosplenomegaly.  MUSCULOSKELETAL:  Symmetrical muscle tone appreciated in all four extremities.    SKIN: Skin turgor is normal. No pathologic skin lesions appreciated.  NEUROLOGIC:  Motor and sensation appear grossly normal.  Cranial nerves are grossly without defect. PSYCH:  Alert and oriented to person, place and time. Affect is appropriate for situation.  Data Reviewed I have personally reviewed what is currently available of the patient's imaging, recent labs and medical records.   Labs:     Latest Ref Rng & Units 07/14/2022    4:07 PM 09/21/2021    2:12 PM 02/26/2021   10:29 AM  CBC  WBC 4.0 - 10.5 K/uL 6.4  6.5  6.7   Hemoglobin 12.0 - 15.0 g/dL 12.9  13.5  13.1   Hematocrit 36.0 - 46.0 % 38.3  41.1  37.6   Platelets 150 - 400 K/uL 219  260  239       Latest Ref Rng & Units 06/18/2022    7:38 AM 06/11/2022    4:08 PM 09/23/2021    7:54 AM  CMP   Glucose 70 - 99 mg/dL 127   141   BUN 6 - 23 mg/dL 15   11   Creatinine 0.40 - 1.20 mg/dL 0.71  0.60  0.64   Sodium 135 - 145 mEq/L 140   137   Potassium 3.5 - 5.1 mEq/L 4.0   3.9   Chloride 96 - 112 mEq/L 104   102   CO2 19 - 32 mEq/L 26   27  Calcium 8.4 - 10.5 mg/dL 9.4   9.4   Total Protein 6.0 - 8.3 g/dL 6.4   7.0   Total Bilirubin 0.2 - 1.2 mg/dL 0.4   0.5   Alkaline Phos 39 - 117 U/L 61   41   AST 0 - 37 U/L 13   15   ALT 0 - 35 U/L 12   14     Imaging: Radiological images reviewed:  CLINICAL DATA:  Hernia suspected, abdominal wall increases postprandial and with exertion   EXAM: CT ABDOMEN AND PELVIS WITH CONTRAST   TECHNIQUE: Multidetector CT imaging of the abdomen and pelvis was performed using the standard protocol following bolus administration of intravenous contrast.   RADIATION DOSE REDUCTION: This exam was performed according to the departmental dose-optimization program which includes automated exposure control, adjustment of the mA and/or kV according to patient size and/or use of iterative reconstruction technique.   CONTRAST:  132m OMNIPAQUE IOHEXOL 300 MG/ML  SOLN   COMPARISON:  CT March 11, 2005   FINDINGS: Lower chest: Solid 3 mm subpleural left lower lobe pulmonary nodule on image 6/3 stable dating back to March 11, 2005 compatible with a benign finding.   Hepatobiliary: No suspicious hepatic lesion. Gallbladder is unremarkable. No biliary ductal dilation.   Pancreas: No pancreatic ductal dilation or evidence of acute inflammation.   Spleen: No splenomegaly.   Adrenals/Urinary Tract: Bilateral adrenal glands appear normal. Mild prominence of the right collecting system and ureter to the level of a large cystic lesion in the right adnexa left kidney is unremarkable. Urinary bladder is unremarkable for degree of distension.   Stomach/Bowel: Stomach is unremarkable for degree of distension. No pathologic dilation of small or  large bowel. Left-sided colonic diverticulosis without findings of acute diverticulitis.   Vascular/Lymphatic: Aortic atherosclerosis. No pathologically enlarged abdominal pelvic lymph nodes.   Reproductive: Large cystic lesion in the right adnexa measures 14.5 x 9.1 cm on image 64/2 with a associated fluid-filled serpiginous tubular structure. Possible focal fluid in the fundal portion of the endometrial canal. Left adnexa is unremarkable.   Other: Large fat containing ventral hernia with a 2 cm aperture width.   Musculoskeletal: Multilevel degenerative changes spine. Degenerative change of the bilateral hips.   IMPRESSION: 1. Large cystic lesion in the right adnexa measuring 14.5 x 9.1 cm with a associated fluid-filled serpiginous tubular structure. Findings are favored to reflect a large right ovarian cystic lesion with hydrosalpinx. Suggest Ob-gyn consult and further evaluation by pelvic MRI with and without contrast. 2. Mild prominence of the right collecting system and ureter to the level of the large cystic lesion in the right adnexa but with symmetric enhancement and excretion of contrast indicative of a minimally obstructive process. 3. Large fat containing ventral hernia with a 2 cm aperture width. 4.  Aortic Atherosclerosis (ICD10-I70.0).   These results will be called to the ordering clinician or representative by the Radiologist Assistant, and communication documented in the PACS or [employer]     Electronically Signed   By: JDahlia BailiffM.D.   On: 06/12/2022 13:28   Within last 24 hrs: No results found.  Assessment    Recurrent AA wall hernia, < 3 cm defect.  Incarcerated without obstruction.  Patient Active Problem List   Diagnosis Date Noted   Adnexal mass 07/18/2022   Rash 02/21/2022   Hemorrhoids 02/21/2022   Encounter for completion of form with patient 09/24/2021   Headache 09/23/2021   Expressive aphasia 09/13/2021   Breast  cancer  screening 02/08/2021   Hematoma 01/18/2021   Leg pain 01/18/2021   Type 2 diabetes mellitus with hyperglycemia ([place]) 05/27/2020   Tricuspid valve insufficiency 03/28/2019   Heart murmur 11/08/2018   History of TIA (transient ischemic attack) 11/08/2018   Abnormal mammogram of right breast 05/03/2018   DOE (dyspnea on exertion) 12/13/2017   Stress 12/13/2017   Hyperglycemia 12/12/2017   Healthcare maintenance 05/17/2016   Abdominal pain, left lower quadrant 08/19/2013   Ventral hernia 08/19/2013   Pyuria 08/19/2013   Sleep apnea 06/17/2012   FLUSHING 02/14/2009   Hyperlipidemia LDL goal <70 01/30/2009   Essential hypertension 01/30/2009   CARDIAC ARRHYTHMIA 01/30/2009   Transient cerebral ischemia 01/30/2009    Plan    My thoughts are that the fascial defect could be an extraction site without creating another abdominal wall defect.  Initial peritoneal entry could be done without sacrificing opportunity for pneumoperitoneum by utilizing a wound protecting Gel point.  Then definitive repair would await the extraction of the specimen and be completed in closure.  Will d/w Dr. Theora Gianotti.  Codes:  [phone] repair of incarcerated recurrent AA wall fascial defect, <3cm.  Possibly [id number] - removal of mesh.  I discussed possibility of incarceration, strangulation, enlargement in size over time, and the risk of emergency surgery in the face of strangulation.  Also discussed the risk of surgery including recurrence which can be up to 30% in the case of complex hernias, use of prosthetic materials (mesh) and the increased risk of infxn, post-op infxn and the possible need for re-operation and removal of mesh if used, possibility of post-op SBO or ileus, and the risks of general anesthetic including MI, CVA, sudden death or even reaction to anesthetic medications. The patient and family understands the risks, any and all questions were answered to the patient's satisfaction.   Face-to-face time spent  with the patient and accompanying care providers(if present) was 45 minutes, with more than 50% of the time spent counseling, educating, and coordinating care of the patient.    These notes generated with voice recognition software. I apologize for typographical errors.  [name] M.D., FACS 07/20/2022, 4:00 PM

## 2022-07-20 ENCOUNTER — Encounter: Payer: Self-pay | Admitting: Surgery

## 2022-07-20 ENCOUNTER — Ambulatory Visit: Payer: 59 | Admitting: Surgery

## 2022-07-20 VITALS — BP 152/76 | HR 66 | Temp 97.8°F | Ht 61.0 in | Wt 198.2 lb

## 2022-07-20 DIAGNOSIS — K439 Ventral hernia without obstruction or gangrene: Secondary | ICD-10-CM

## 2022-07-20 NOTE — Patient Instructions (Signed)
Our surgery scheduler Pamala Hurry will call you within 24-48 hours to get you scheduled. If you have not heard from her after 48 hours, please call our office. Have the blue sheet available when she calls to write down important information.   If you have any concerns or questions, please feel free to call our office.   Ventral Hernia  A ventral hernia is a bulge of tissue from inside the abdomen that pushes through a weak area of the muscles that form the front wall of the abdomen. The tissues inside the abdomen are inside a sac (peritoneum). These tissues include the small intestine, large intestine, and the fatty tissue that covers the intestines (omentum). Sometimes, the bulge that forms a hernia contains intestines. Other hernias contain only fat. Ventral hernias do not go away without surgical treatment. There are several types of ventral hernias. You may have: A hernia at an incision site from previous abdominal surgery (incisional hernia). A hernia just above the belly button (epigastric hernia), or at the belly button (umbilical hernia). These types of hernias can develop from heavy lifting or straining. A hernia that comes and goes (reducible hernia). It may be visible only when you lift or strain. This type of hernia can be pushed back into the abdomen (reduced). A hernia that traps abdominal tissue inside the hernia (incarcerated hernia). This type of hernia does not reduce. A hernia that cuts off blood flow to the tissues inside the hernia (strangulated hernia). The tissues can start to die if this happens. This is a very painful bulge that cannot be reduced. A strangulated hernia is a medical emergency. What are the causes? This condition is caused by abdominal tissue putting pressure on an area of weakness in the abdominal muscles. What increases the risk? The following factors may make you more likely to develop this condition: Being age 83 or older. Being overweight or obese. Having  had previous abdominal surgery, especially if there was an infection after surgery. Having had an injury to the abdominal wall. Frequently lifting or pushing heavy objects. Having had several pregnancies. Having a buildup of fluid inside the abdomen (ascites). Straining to have a bowel movement or to urinate. Having frequent coughing episodes. What are the signs or symptoms? The only symptom of a ventral hernia may be a painless bulge in the abdomen. A reducible hernia may be visible only when you strain, cough, or lift. Other symptoms may include: Dull pain. A feeling of pressure. Signs and symptoms of a strangulated hernia may include: Increasing pain. Nausea and vomiting. Pain when pressing on the hernia. The skin over the hernia turning red or purple. Constipation. Blood in the stool (feces). How is this diagnosed? This condition may be diagnosed based on: Your symptoms. Your medical history. A physical exam. You may be asked to cough or strain while standing. These actions increase the pressure inside your abdomen and force the hernia through the opening in your muscles. Your health care provider may try to reduce the hernia by gently pushing the hernia back in. Imaging studies, such as an ultrasound or CT scan. How is this treated? This condition is treated with surgery. If you have a strangulated hernia, surgery is done as soon as possible. If your hernia is small and not incarcerated, you may be asked to lose some weight before surgery. Follow these instructions at home: Follow instructions from your health care provider about eating or drinking restrictions. If you are overweight, your health care provider may recommend  that you increase your activity level and eat a healthier diet. Do not lift anything that is heavier than 10 lb (4.5 kg), or the limit that you are told, until your health care provider says that it is safe. Return to your normal activities as told by your  health care provider. Ask your health care provider what activities are safe for you. You may need to avoid activities that increase pressure on your hernia. Take over-the-counter and prescription medicines only as told by your health care provider. Keep all follow-up visits. This is important. Contact a health care provider if: Your hernia gets larger. Your hernia becomes painful. Get help right away if: Your hernia becomes increasingly painful. You have pain along with any of the following: Changes in skin color in the area of the hernia. Nausea. Vomiting. Fever. These symptoms may represent a serious problem that is an emergency. Do not wait to see if the symptoms will go away. Get medical help right away. Call your local emergency services (911 in the [place] Do not drive yourself to the hospital. Summary A ventral hernia is a bulge of tissue from inside the abdomen that pushes through a weak area of the muscles that form the front wall of the abdomen. This condition is treated with surgery, which may be urgent depending on your hernia. Do not lift anything that is heavier than 10 lb (4.5 kg), and follow activity instructions from your health care provider. This information is not intended to replace advice given to you by your health care provider. Make sure you discuss any questions you have with your health care provider. Document Revised: 01/18/2020 Document Reviewed: 01/18/2020 Elsevier Patient Education  [place].

## 2022-07-21 ENCOUNTER — Telehealth: Payer: Self-pay | Admitting: Surgery

## 2022-07-21 NOTE — Telephone Encounter (Signed)
Left message for patient to call, please inform her of the following scheduled surgery with Dr. Christian Mate.  This is a combined case with Dr. Theora Gianotti (primary surgeon on case).    Pre-Admission date/time, and Surgery date at [facility].  Surgery Date: 08/04/22 Preadmission Testing Date: 07/29/22 (phone 8a-1p)  Also patient will need to call at [phone], between 1-3:00pm the day before surgery, to find out what time to arrive for surgery.

## 2022-07-23 NOTE — Telephone Encounter (Signed)
Left another message for patient to call us.

## 2022-07-26 NOTE — Telephone Encounter (Signed)
This is a combined case with Dr. Gershon Crane office.  To date, patient has yet to call us back so that we can provide surgery info to her, left another message.

## 2022-07-28 ENCOUNTER — Telehealth: Payer: Self-pay | Admitting: *Deleted

## 2022-07-28 ENCOUNTER — Inpatient Hospital Stay: Payer: 59 | Attending: Obstetrics and Gynecology | Admitting: Obstetrics and Gynecology

## 2022-07-28 ENCOUNTER — Encounter: Payer: Self-pay | Admitting: Obstetrics and Gynecology

## 2022-07-28 VITALS — BP 139/76 | HR 76 | Temp 98.7°F | Resp 20 | Wt 196.7 lb

## 2022-07-28 DIAGNOSIS — R971 Elevated cancer antigen 125 [CA 125]: Secondary | ICD-10-CM | POA: Insufficient documentation

## 2022-07-28 DIAGNOSIS — Z7189 Other specified counseling: Secondary | ICD-10-CM

## 2022-07-28 DIAGNOSIS — D398 Neoplasm of uncertain behavior of other specified female genital organs: Secondary | ICD-10-CM

## 2022-07-28 DIAGNOSIS — R3915 Urgency of urination: Secondary | ICD-10-CM | POA: Insufficient documentation

## 2022-07-28 DIAGNOSIS — D3911 Neoplasm of uncertain behavior of right ovary: Secondary | ICD-10-CM | POA: Diagnosis not present

## 2022-07-28 DIAGNOSIS — N9489 Other specified conditions associated with female genital organs and menstrual cycle: Secondary | ICD-10-CM

## 2022-07-28 DIAGNOSIS — K439 Ventral hernia without obstruction or gangrene: Secondary | ICD-10-CM

## 2022-07-28 NOTE — Patient Instructions (Signed)
DIVISION OF GYNECOLOGIC ONCOLOGY BOWEL PREP   The following instructions are extremely important to prepare for your surgery. Please follow them carefully   Step 1: Liquid Diet Instructions              Clear Liquid Diet for GYN Oncology Patients Day Before Surgery The day before your scheduled surgery DO NOT EAT any solid foods.  We do want you to drink enough liquids, but NO MILK products.  We do not want you to be dehydrated.  Clear liquids are defined as no milk products and no pieces of any solid food. Drink at least 64 oz. of fluid.  The following are all approved for you to drink the day before you surgery. Chicken, Beef or Vegetable Broth (bouillon or consomm) - NO BROTH AFTER MIDNIGHT Plain Jello  (no fruit) Water Strained lemonade or fruit punch Gatorade (any flavor) CLEAR Ensure or Boost Breeze Fruit juices without pulp, such as apple, grape, or cranberry juice Clear sodas - NO SODA AFTER MIDNIGHT Ice Pops without bits of fruit or fruit pulp Honey Tea or coffee without milk or cream                 Any foods not on the above list should be avoided                                                                                             Step 2: Laxatives           The evening before surgery:   Time: around 5pm   Follow these instructions carefully.   Administer 1 Dulcolax suppository according to manufacturer instructions on the box. You will need to purchase this laxative at a pharmacy or grocery store.    Individual responses to laxatives vary; this prep may cause multiple bowel movements. It often works in 30 minutes and may take as long as 3 hours. Stay near an available bathroom.    It is important to stay hydrated. Ensure you are still drinking clear liquids.     IMPORTANT: FOR YOUR SAFETY, WE WILL HAVE TO CANCEL YOUR SURGERY IF YOU DO NOT FOLLOW THESE INSTRUCTIONS.   Do not eat anything after midnight (including gum or candy) prior to your  surgery. Avoid drinking carbonated beverages after midnight. You can have clear liquids up until one hour before you arrive at the hospital. "Nothing by mouth" means no liquids, gum, candy, etc for one hour before your arrival time.      Laparoscopy Laparoscopy is a procedure to diagnose diseases in the abdomen. During the procedure, a thin, lighted, pencil-sized instrument called a laparoscope is inserted into the abdomen through an incision. The laparoscope allows your health care provider to look at the organs inside your body. LET [facility] CARE PROVIDER KNOW ABOUT: Any allergies you have. All medicines you are taking, including vitamins, herbs, eye drops, creams, and over-the-counter medicines. Previous problems you or members of your family have had with the use of anesthetics. Any blood disorders you have. Previous surgeries you have had. Medical conditions you have. RISKS AND COMPLICATIONS  Generally, this  is a safe procedure. However, problems can occur, which may include: Infection. Bleeding. Damage to other organs. Allergic reaction to the anesthetics used during the procedure. BEFORE THE PROCEDURE Do not eat or drink anything after midnight on the night before the procedure or as directed by your health care provider. Ask your health care provider about: Changing or stopping your regular medicines. Taking medicines such as aspirin and ibuprofen. These medicines can thin your blood. Do not take these medicines before your procedure if your health care provider instructs you not to. Plan to have someone take you home after the procedure. PROCEDURE You may be given a medicine to help you relax (sedative). You will be given a medicine to make you sleep (general anesthetic). Your abdomen will be inflated with a gas. This will make your organs easier to see. Small incisions will be made in your abdomen. A laparoscope and other small instruments will be inserted into the abdomen  through the incisions. A tissue sample may be removed from an organ in the abdomen for examination. The instruments will be removed from the abdomen. The gas will be released. The incisions will be closed with stitches (sutures). AFTER THE PROCEDURE  Your blood pressure, heart rate, breathing rate, and blood oxygen level will be monitored often until the medicines you were given have worn off.   This information is not intended to replace advice given to you by your health care provider. Make sure you discuss any questions you have with your health care provider.                                             Bowel Symptoms After Surgery After gynecologic surgery, women often have temporary changes in bowel function (constipation and gas pain).  Following are tips to help prevent and treat common bowel problems.  It also tells you when to call the doctor.  This is important because some symptoms might be a sign of a more serious bowel problem such as obstruction (bowel blockage).  These problems are rare but can happen after gynecologic surgery.   Besides surgery, what can temporarily affect bowel function? 1. Dietary changes   2. Decreased physical activity   3.Antibiotics   4. Pain medication   How can I prevent constipation (three days or more without a stool)? Include fiber in your diet: whole grains, raw or dried fruits & vegetables, prunes, prune/pear juiceDrink at least 8 glasses of liquid (preferably water) every day Avoid: Gas forming foods such as broccoli, beans, peas, salads, cabbage, sweet potatoes Greasy, fatty, or fried foods Activity helps bowel function return to normal, walk around the house at least 3-4 times each day for 15 minutes or longer, if tolerated.  Rocking in a rocking chair is preferable to sitting still. Stool softeners: these are not laxatives, but serve to soften the stool to avoid straining.  Take 2-4 times a day until normal bowel function returns          Examples: Colace or generic equivalent (Docusate) Bulk laxatives: provide a concentrated source of fiber.  They do not stimulate the bowel.  Take 1-2 times each day until normal bowel function return.              Examples: Citrucel, Metamucil, Fiberal, Fibercon   What can I take for "Gas Pains"? Simethicone (Mylicon, Gas-X, Maalox-Gas, Mylanta-Gas) take 3-4  times a day Maalox Regular - take 3-4 times a day Mylanta Regular - take 3-4 times a day   What can I take if I become constipated? Start with stool softeners and add additional laxatives below as needed to have a bowel movement every 1-2 days  Stool softeners 1-2 tablets, 2 times a day Senokot 1-2 tablets, 1-2 times a day Glycerin suppository can soften hard stool take once a day Bisacodyl suppository once a day  Milk of Magnesia 30 mL 1-2 times a day Fleets or tap water enema    What can I do for nausea?  Limit most solid foods for 24-48 hours Continue eating small frequent amounts of liquids and/or bland soft foods Toast, crackers, cooked cereal (grits, cream of wheat, rice) Benadryl: a mild anti-nausea medicine can be obtained without a prescription. May cause drowsiness, especially if taken with narcotic pain medicines Contact provider for prescription nausea medication     What can I do, or take for diarrhea (more than five loose stools per day)? Drink plenty of clear fluids to prevent dehydration May take Kaopectate, Pepto-Bismol, Imodium, or probiotics for 1-2 days Anusol or Preparation-H can be helpful for hemorrhoids and irritated tissue around anus   When should I call the doctor?             CONSTIPATION:  Not relieved after three days following the above program VOMITING: That contains blood, "coffee ground" material More the three times/hour and unable to keep down nausea medication for more than eight hours With dry mouth, dark or strong urine, feeling light-headed, dizzy, or confused With severe abdominal pain  or bloating for more than 24 hours DIARRHEA: That continues for more then 24-48 hours despite treatment That contains blood or tarry material With dry mouth, dark or strong urine, feeling light~headed, dizzy, or confused FEVER: 101 F or higher along with nausea, vomiting, gas pain, diarrhea UNABLE TO: Pass gas from rectum for more than 24 hours Tolerate liquids by mouth for more than 24 hours        Laparoscopic Hysterectomy, Care After Refer to this sheet in the next few weeks. These instructions provide you with information on caring for yourself after your procedure. Your health care provider may also give you more specific instructions. Your treatment has been planned according to current medical practices, but problems sometimes occur. Call your health care provider if you have any problems or questions after your procedure. What can I expect after the procedure? Pain and bruising at the incision sites. You will be given pain medicine to control it. Menopausal symptoms such as hot flashes, night sweats, and insomnia if your ovaries were removed. Sore throat from the breathing tube that was inserted during surgery. Follow these instructions at home: Only take over-the-counter or prescription medicines for pain, discomfort, or fever as directed by your health care provider. Do not take aspirin. It can cause bleeding. Do not drive when taking pain medicine. Follow your health care provider's advice regarding diet, exercise, lifting, driving, and general activities. Resume your usual diet as directed and allowed. Get plenty of rest and sleep. Do not douche, use tampons, or have sexual intercourse for at least 6 weeks, or until your health care provider gives you permission. Change your bandages (dressings) as directed by your health care provider. Monitor your temperature and notify your health care provider of a fever. Take showers instead of baths for 2-3 weeks. Do not drink alcohol  until your health care provider gives you  permission. If you develop constipation, you may take a mild laxative with your health care provider's permission. Bran foods may help with constipation problems. Drinking enough fluids to keep your urine clear or pale yellow may help as well. Try to have someone home with you for 1-2 weeks to help around the house. Keep all of your follow-up appointments as directed by your health care provider. Contact a health care provider if: You have swelling, redness, or increasing pain around your incision sites. You have pus coming from your incision. You notice a bad smell coming from your incision. Your incision breaks open. You feel dizzy or lightheaded. You have pain or bleeding when you urinate. You have persistent diarrhea. You have persistent nausea and vomiting. You have abnormal vaginal discharge. You have a rash. You have any type of abnormal reaction or develop an allergy to your medicine. You have poor pain control with your prescribed medicine. Get help right away if: You have chest pain or shortness of breath. You have severe abdominal pain that is not relieved with pain medicine. You have pain or swelling in your legs. This information is not intended to replace advice given to you by your health care provider. Make sure you discuss any questions you have with your health care provider. Document Released: 03/21/2013 Document Revised: 11/06/2015 Document Reviewed: 12/19/2012 [employer] Interactive Patient Education  2017 [employer].

## 2022-07-28 NOTE — Progress Notes (Signed)
Gynecologic Oncology Interval Visit   Referring Provider: Dr. Leafy Ro  Chief Complaint: adnexal Mass, elevated ca 125  Subjective:  Margaret Mercado is a 67 y.o. female who is seen in consultation from Dr. Leafy Ro for adnexal mass and elevated ca 125.   She presents today for her preop evaluation.   She saw Dr. Christian Mate on 07/20/2022 and he discussed hernia repair at the time of surgery.   CXR 07/14/2022  IMPRESSION: No active cardiopulmonary disease EKG 07/15/2022 EKG - SR/SB with no acute ischemic changes. Patient notified of results by Dr. Einar Pheasant.    Gynecologic Oncology History   Patient was seen by PCP for abdominal pain that was thought to be related to a known hernia. 06/11/22 CT showed adnexal mass on right measuring 14.5 x 9.1 cm with fluid filled serpiginous tubular structure. Possible focal fluid in the fundal portion of the endometrial canal. Large fat containing ventral hernia with a 2cm aperture width. She was seen by Dr. Leafy Ro.   06/18/22- CA 125: 298.   07/07/22- US Pelvis Uterus: Measurements: 8.6 x 5.4 x 5.8 cm = volume: 140 mL Endometrium: 10.3 mm.  Right ovary: not visualized. 16.1 x 9.9 x 9.3 cm cystic right adnexal mass incompletely visualized Left ovary: not visualized No abnormal free fluid.   06/18/22 HmgA1c - 6.1  She has noticed some changes in caliber of stool. She has urinary urgency at baseline but feels it is worse over past year.   Problem List: Patient Active Problem List   Diagnosis Date Noted   Adnexal mass 07/18/2022   Rash 02/21/2022   Hemorrhoids 02/21/2022   Encounter for completion of form with patient 09/24/2021   Headache 09/23/2021   Expressive aphasia 09/13/2021   Breast cancer screening 02/08/2021   Hematoma 01/18/2021   Leg pain 01/18/2021   Type 2 diabetes mellitus with hyperglycemia ([place]) 05/27/2020   Tricuspid valve insufficiency 03/28/2019   Heart murmur 11/08/2018   History of TIA (transient ischemic attack) 11/08/2018    Abnormal mammogram of right breast 05/03/2018   DOE (dyspnea on exertion) 12/13/2017   Stress 12/13/2017   Hyperglycemia 12/12/2017   Healthcare maintenance 05/17/2016   Abdominal pain, left lower quadrant 08/19/2013   Ventral hernia 08/19/2013   Pyuria 08/19/2013   Sleep apnea 06/17/2012   FLUSHING 02/14/2009   Hyperlipidemia LDL goal <70 01/30/2009   Essential hypertension 01/30/2009   CARDIAC ARRHYTHMIA 01/30/2009   Transient cerebral ischemia 01/30/2009    Past Medical History: Past Medical History:  Diagnosis Date   Abnormal echocardiogram 01/2009   Mild LVH, EF >55%, no regional wall motion abnormalities, normal RV, mild aortic insufficiency, no aortic stenosis.   Adnexal mass    Anxiety    Dyspnea    Heart murmur    Hemorrhoids    Hyperlipidemia    Hypertension    Edema with Norvasc. Unable to tolerate Lisinopril/ HCTZ.   Hypokalemia    Manifested by lip and finger tingling   Hypothyroidism    Palpitations    Event monitor 01/2009 with no significant arrhythmias   Sleep apnea    does not use cpap   TIA (transient ischemic attack) 01/2009   CT and MRI of head showed no evidence for stroke. Carotid dopplers showed no significant plaque   Transient cerebral ischemia    Tricuspid valve insufficiency    Ventral hernia     Past Surgical History: Past Surgical History:  Procedure Laterality Date   CRYOABLATION     Of uterus  UMBILICAL HERNIA REPAIR  2010   Dr Nicholes Stairs   URETHRAL DILATION     removal of bladder polyps    Past Gynecologic History:  Menarche: age 15 Post menopausal   OB History:  OB History  No obstetric history on file.  G2P2  Family History: Family History  Problem Relation Age of Onset   Hypertension Mother    Arrhythmia Mother        Atrial fibrillation   Hypertension Father    Valvular heart disease Father    Hypertension Brother    Hypertension Paternal Grandmother    Coronary artery disease Neg Hx        Premature    Breast cancer Neg Hx    Colon cancer Neg Hx     Social History: Social History   Socioeconomic History   Marital status: Married    Spouse name: Not on file   Number of children: 2   Years of education: Not on file   Highest education level: Not on file  Occupational History   Occupation: Horticulturist, commercial    Comment: full time  Tobacco Use   Smoking status: Never    Passive exposure: Never   Smokeless tobacco: Never  Vaping Use   Vaping Use: Never used  Substance and Sexual Activity   Alcohol use: No    Alcohol/week: 0.0 standard drinks of alcohol   Drug use: No   Sexual activity: Yes    Birth control/protection: None  Other Topics Concern   Not on file  Social History Narrative   Married   Gets regular exercise   Social Determinants of Health   Financial Resource Strain: Not on file  Food Insecurity: Not on file  Transportation Needs: Not on file  Physical Activity: Not on file  Stress: Not on file  Social Connections: Not on file  Intimate Partner Violence: Not on file    Allergies: Allergies  Allergen Reactions   Vicodin [Hydrocodone-Acetaminophen] Anaphylaxis   Amlodipine Besylate    Cardizem [Diltiazem Hcl] Other (See Comments)    Flushing    Erythromycin Other (See Comments)    Chest heaviness    Hydrocodone-Acetaminophen    Levaquin [Levofloxacin In D5w] Other (See Comments)    Rapid heart beat   Lisinopril Other (See Comments)    Difficulty breathing and rash   Macrobid [Nitrofurantoin Macrocrystal] Other (See Comments)    Wheezing and headache   Oxycodone-Acetaminophen    Sulfonamide Derivatives Rash    Current Medications: No current facility-administered medications for this visit.   No current outpatient medications on file.   Facility-Administered Medications Ordered in Other Visits  Medication Dose Route Frequency Provider Last Rate Last Admin   0.9 %  sodium chloride infusion   Intravenous Continuous Darrin Nipper, MD 10  mL/hr at 08/04/22 0711 New Bag at 08/04/22 0711   bupivacaine liposome (EXPAREL) 1.3 % injection 266 mg  20 mL Infiltration Once [name], MD       ceFAZolin (ANCEF) 2-4 GM/100ML-% IVPB            ceFAZolin (ANCEF) IVPB 2g/100 mL premix  2 g Intravenous On Call to OR Verlon Au, NP       Chlorhexidine Gluconate Cloth 2 % PADS 6 each  6 each Topical Once [name], MD       And   Chlorhexidine Gluconate Cloth 2 % PADS 6 each  6 each Topical Once [name], MD       Chlorhexidine  Gluconate Cloth 2 % PADS 6 each  6 each Topical Once Verlon Au, NP       And   Chlorhexidine Gluconate Cloth 2 % PADS 6 each  6 each Topical Once Verlon Au, NP       famotidine (PEPCID) 20 MG tablet            famotidine (PEPCID) tablet 20 mg  20 mg Oral Once Karen Kitchens, NP       lactated ringers infusion   Intravenous Continuous Arita Miss, MD       metroNIDAZOLE (FLAGYL) IVPB 500 mg  500 mg Intravenous On Call to OR Verlon Au, NP        Review of Systems General:  no complaints Skin: no complaints Eyes: no complaints HEENT: no complaints Breasts: no complaints Pulmonary: no complaints Cardiac: no complaints Gastrointestinal: no complaints Genitourinary/Sexual: no complaints Ob/Gyn: no complaints Musculoskeletal: no complaints Hematology: no complaints Neurologic/Psych: no complaints    Objective:  Physical Examination:  BP 139/76   Pulse 76   Temp 98.7 F (37.1 C)   Resp 20   Wt 196 lb 11.2 oz (89.2 kg)   SpO2 98%   BMI 37.17 kg/m     Performance status = 1   GENERAL: Patient is a well appearing female in no acute distress. Accompanied.  HEART: regular rate and rhythm  LUNGS:  Normal respiratory effort.  ABDOMEN:  Soft, nontender, nondistended. Large pelvic mass extending from the pelvis to the umbilicus. 4-5 cm supraumbilical mass due to hernia. Non-reducible. Well healed transverse incision. No ascites or hepatosplenomegaly appreciated.   EXTREMITIES:  No peripheral edema.   NEURO:  Nonfocal. Well oriented.  Appropriate affect.  Pelvic: deferred  Lab Review Labs on site today:  Lab Results  Component Value Date   WBC 6.4 07/14/2022   HGB 12.9 07/14/2022   HCT 38.3 07/14/2022   MCV 94.3 07/14/2022   PLT 219 07/14/2022     Chemistry      Component Value Date/Time   NA 140 08/02/2022 1438   K 4.1 08/02/2022 1438   CL 106 08/02/2022 1438   CO2 27 08/02/2022 1438   BUN 18 08/02/2022 1438   CREATININE 0.65 08/02/2022 1438      Component Value Date/Time   CALCIUM 8.9 08/02/2022 1438   ALKPHOS 61 06/18/2022 0738   AST 13 06/18/2022 0738   ALT 12 06/18/2022 0738   BILITOT 0.4 06/18/2022 0738     Lab Results  Component Value Date   TSH 1.194 07/14/2022     Radiologic Imaging: 06/11/2022 Narrative & Impression  CLINICAL DATA:  Hernia suspected, abdominal wall increases postprandial and with exertion   EXAM: CT ABDOMEN AND PELVIS WITH CONTRAST   TECHNIQUE: Multidetector CT imaging of the abdomen and pelvis was performed using the standard protocol following bolus administration of intravenous contrast.   RADIATION DOSE REDUCTION: This exam was performed according to the departmental dose-optimization program which includes automated exposure control, adjustment of the mA and/or kV according to patient size and/or use of iterative reconstruction technique.   CONTRAST:  162m OMNIPAQUE IOHEXOL 300 MG/ML  SOLN   COMPARISON:  CT March 11, 2005   FINDINGS: Lower chest: Solid 3 mm subpleural left lower lobe pulmonary nodule on image 6/3 stable dating back to March 11, 2005 compatible with a benign finding.   Hepatobiliary: No suspicious hepatic lesion. Gallbladder is unremarkable. No biliary ductal dilation.   Pancreas: No pancreatic ductal dilation or evidence  of acute inflammation.   Spleen: No splenomegaly.   Adrenals/Urinary Tract: Bilateral adrenal glands appear normal.  Mild prominence of the right collecting system and ureter to the level of a large cystic lesion in the right adnexa left kidney is unremarkable. Urinary bladder is unremarkable for degree of distension.   Stomach/Bowel: Stomach is unremarkable for degree of distension. No pathologic dilation of small or large bowel. Left-sided colonic diverticulosis without findings of acute diverticulitis.   Vascular/Lymphatic: Aortic atherosclerosis. No pathologically enlarged abdominal pelvic lymph nodes.   Reproductive: Large cystic lesion in the right adnexa measures 14.5 x 9.1 cm on image 64/2 with a associated fluid-filled serpiginous tubular structure. Possible focal fluid in the fundal portion of the endometrial canal. Left adnexa is unremarkable.   Other: Large fat containing ventral hernia with a 2 cm aperture width.   Musculoskeletal: Multilevel degenerative changes spine. Degenerative change of the bilateral hips.   IMPRESSION: 1. Large cystic lesion in the right adnexa measuring 14.5 x 9.1 cm with a associated fluid-filled serpiginous tubular structure. Findings are favored to reflect a large right ovarian cystic lesion with hydrosalpinx. Suggest Ob-gyn consult and further evaluation by pelvic MRI with and without contrast. 2. Mild prominence of the right collecting system and ureter to the level of the large cystic lesion in the right adnexa but with symmetric enhancement and excretion of contrast indicative of a minimally obstructive process. 3. Large fat containing ventral hernia with a 2 cm aperture width. 4.  Aortic Atherosclerosis (ICD10-I70.0).   These results will be called to the ordering clinician or representative by the Radiologist Assistant, and communication documented in the PACS or [employer].     Electronically Signed   By: Dahlia Bailiff M.D.   On: 06/12/2022 13:28      Assessment:  Margaret Mercado is a 67 y.o. female diagnosed with large complex  adnexal mass with elevated CA125 and abnormal uterine finding on CT scan. Differential diagnosis includes benign versus malignant ovarian cancer; endometrial malignancy given abnormal uterine findings on imaging with either metastatic disease to the ovary or benign ovarian cystic mass.   Symptomatic nonreducible hernia s/p prior hernia repair  Hypothyroidism, on replacement, asymptomatic  Medical co-morbidities complicating care: Body mass index is 37.17 kg/m. Mild LVH, HTN, h/o TIA 2010  Plan:   Problem List Items Addressed This Visit       Other   Adnexal mass - Primary   Ventral hernia   Other Visit Diagnoses     Counseling and coordination of care           We discussed options for management and recommended surgery.  Dr. Christian Mate assess her for the hernia and hernia repair options.  He is able to assist with her surgery. We plan to attempt a minimally invasive approach with a hand-assisted port and either robotic or laparoscopy.  Control drainage would need to be performed in order to avoid laparotomy.  In addition laparotomy may be required in order to perform the procedure.  She was consented today for robotic assisted hysterectomy with bilateral salpingo-oophorectomy and possible staging based on frozen section intraoperative evaluation to include omentectomy lymph node dissection and biopsies if needed.  Possible laparotomy.  Hernia repair will be contacted by Dr. Christian Mate  Hypertension seems to be a well controlled.    H/o hypothyroidism now on replacement with no concerning symptoms -repeat TSH normal.   History of mild LVH and TIA no concerning current symptoms.  Preoperative EKG and chest x-ray  reassuring.   The risks of surgery were discussed in detail and she understands these risk to include but not limited to: infection, wound separation, hernia, vaginal cuff separation, injury to adjacent organs such as bowel, bladder, blood vessels, ureters, and/or nerves,  bleeding which may require blood transfusion, anesthesia risk, thromboembolic events, possible death, unforeseen complications, possible need for re-exploration, medical complications such as heart attack, stroke, and pneumonia, and if staging performed the risk of lymphedema and lymphocyst. The patient will receive DVT and antibiotic prophylaxis as indicated. She voiced a clear understanding. She had the opportunity to ask questions and electronic informed consent was obtained today.   Perioperative  Anticoagulation  VTE Risk Plan: High Risk: 4 Points: age, BMI. One pre-op dose UFH, then continuous post-op dosing with LMWH or UFH while inpatient AND SCDs.  We reached out to Dr. Christian Mate and he will see patient on 07/29/22 to discuss her concerns regarding hernia repair.   Suggested return to clinic in 4-6 weeks postoperatively.   The patient's diagnosis, an outline of the further diagnostic and laboratory studies which will be required, the recommendation for surgery, and alternatives were discussed with her and her accompanying family members.  All questions were answered to their satisfaction.  A total of 80 minutes were spent with the patient/family today; >50% was spent in education, counseling and coordination of care for large complex adnexal mass with elevated CA125 and abnormal uterine finding on CT scan, and ventral hernia.   Beckey Rutter, DNP, AGNP-C, [place] at [facility] [phone] (clinic)  I personally interviewed and examined the patient. Agreed with the above/below plan of care. Patient/family questions were answered.  Santiago Glad, MD  CC:  Dr. Leafy Ro

## 2022-07-28 NOTE — Telephone Encounter (Signed)
Form completed and signed, faxed and conformation received. Call to patient to inform her that it was faxed and a copy is at front desk for her to pick up. I had to leave her a voice mail regarding this

## 2022-07-28 NOTE — Telephone Encounter (Signed)
FMLA form received and completed and sent for physician signature

## 2022-07-29 ENCOUNTER — Ambulatory Visit: Payer: Self-pay | Admitting: Surgery

## 2022-07-29 ENCOUNTER — Telehealth: Payer: Self-pay | Admitting: *Deleted

## 2022-07-29 ENCOUNTER — Encounter
Admission: RE | Admit: 2022-07-29 | Discharge: 2022-07-29 | Disposition: A | Discharge status: Home / Self Care | Payer: 59 | Source: Ambulatory Visit | Attending: Obstetrics and Gynecology | Admitting: Obstetrics and Gynecology

## 2022-07-29 ENCOUNTER — Encounter: Payer: Self-pay | Admitting: Surgery

## 2022-07-29 ENCOUNTER — Ambulatory Visit: Payer: 59 | Admitting: Surgery

## 2022-07-29 VITALS — BP 132/73 | HR 67 | Temp 98.0°F | Ht 61.0 in | Wt 195.0 lb

## 2022-07-29 DIAGNOSIS — Z01812 Encounter for preprocedural laboratory examination: Secondary | ICD-10-CM

## 2022-07-29 DIAGNOSIS — Z79899 Other long term (current) drug therapy: Secondary | ICD-10-CM

## 2022-07-29 DIAGNOSIS — K439 Ventral hernia without obstruction or gangrene: Secondary | ICD-10-CM | POA: Diagnosis not present

## 2022-07-29 DIAGNOSIS — I10 Essential (primary) hypertension: Secondary | ICD-10-CM

## 2022-07-29 HISTORY — DX: Hypothyroidism, unspecified: E03.9

## 2022-07-29 HISTORY — DX: Sleep apnea, unspecified: G47.30

## 2022-07-29 HISTORY — DX: Unspecified hemorrhoids: K64.9

## 2022-07-29 HISTORY — DX: Transient cerebral ischemic attack, unspecified: G45.9

## 2022-07-29 HISTORY — DX: Rheumatic tricuspid insufficiency: I07.1

## 2022-07-29 HISTORY — DX: Dyspnea, unspecified: R06.00

## 2022-07-29 HISTORY — DX: Other specified conditions associated with female genital organs and menstrual cycle: N94.89

## 2022-07-29 NOTE — Patient Instructions (Signed)
Your procedure is scheduled on:08-04-22 Wednesday Report to the Registration Desk on the 1st floor of the [facility].Then proceed to the 2nd floor Surgery Desk To find out your arrival time, please call [phone] between 1PM - 3PM on:08-03-22 Tuesday If your arrival time is 6:00 am, do not arrive before that time as the [place] entrance doors do not open until 6:00 am.  REMEMBER: Instructions that are not followed completely may result in serious medical risk, up to and including death; or upon the discretion of your surgeon and anesthesiologist your surgery may need to be rescheduled.  Do not eat food OR drink any liquids after midnight the night before surgery.  No gum chewing or hard candies  One week prior to surgery: Stop Anti-inflammatories (NSAIDS) such as Advil, Aleve, Ibuprofen, Motrin, Naproxen, Naprosyn and Aspirin based products such as Excedrin, Goody's Powder, BC Powder.You may however, continue to take Tylenol if needed for pain up until the day of surgery. Stop ANY OVER THE COUNTER supplements/vitamins NOW (07-29-22) until after surgery.   Last dose of 81 mg Aspirin was on 07-23-22   TAKE ONLY THESE MEDICATIONS THE MORNING OF SURGERY WITH A SIP OF WATER: -levothyroxine (SYNTHROID)  -nebivolol (BYSTOLIC)  -rosuvastatin (CRESTOR)  -sertraline (ZOLOFT)   No Alcohol for 24 hours before or after surgery.  No Smoking including e-cigarettes for 24 hours before surgery.  No chewable tobacco products for at least 6 hours before surgery.  No nicotine patches on the day of surgery.  Do not use any "recreational" drugs for at least a week (preferably 2 weeks) before your surgery.  Please be advised that the combination of cocaine and anesthesia may have negative outcomes, up to and including death. If you test positive for cocaine, your surgery will be cancelled.  On the morning of surgery brush your teeth with toothpaste and water, you may rinse your mouth with mouthwash  if you wish. Do not swallow any toothpaste or mouthwash.  Use CHG Soap as directed on instruction sheet.  Do not wear jewelry, make-up, hairpins, clips or nail polish.  Do not wear lotions, powders, or perfumes.   Do not shave body hair from the neck down 48 hours before surgery.  Contact lenses, hearing aids and dentures may not be worn into surgery.  Do not bring valuables to the hospital. [facility] is not responsible for any missing/lost belongings or valuables.   Notify your doctor if there is any change in your medical condition (cold, fever, infection).  Wear comfortable clothing (specific to your surgery type) to the hospital.  After surgery, you can help prevent lung complications by doing breathing exercises.  Take deep breaths and cough every 1-2 hours. Your doctor may order a device called an Incentive Spirometer to help you take deep breaths. When coughing or sneezing, hold a pillow firmly against your incision with both hands. This is called "splinting." Doing this helps protect your incision. It also decreases belly discomfort.  If you are being admitted to the hospital overnight, leave your suitcase in the car. After surgery it may be brought to your room.  In case of increased patient census, it may be necessary for you, the patient, to continue your postoperative care in the Same Day Surgery department.  If you are being discharged the day of surgery, you will not be allowed to drive home. You will need a responsible individual to drive you home and stay with you for 24 hours after surgery.   If  you are taking public transportation, you will need to have a responsible individual with you.  Please call the [place] Dept. at [phone] if you have any questions about these instructions.  Surgery Visitation Policy:  Patients undergoing a surgery or procedure may have two family members or support persons with them as long as the person is not  COVID-19 positive or experiencing its symptoms.   Due to an increase in RSV and influenza rates and associated hospitalizations, children ages 55 and under will not be able to visit patients in [facility]. Masks continue to be strongly recommended.     Preparing for Surgery with CHLORHEXIDINE GLUCONATE (CHG) Soap  Chlorhexidine Gluconate (CHG) Soap  o An antiseptic cleaner that kills germs and bonds with the skin to continue killing germs even after washing  o Used for showering the night before surgery and morning of surgery  Before surgery, you can play an important role by reducing the number of germs on your skin.  CHG (Chlorhexidine gluconate) soap is an antiseptic cleanser which kills germs and bonds with the skin to continue killing germs even after washing.  Please do not use if you have an allergy to CHG or antibacterial soaps. If your skin becomes reddened/irritated stop using the CHG.  1. Shower the NIGHT BEFORE SURGERY and the MORNING OF SURGERY with CHG soap.  2. If you choose to wash your hair, wash your hair first as usual with your normal shampoo.  3. After shampooing, rinse your hair and body thoroughly to remove the shampoo.  4. Use CHG as you would any other liquid soap. You can apply CHG directly to the skin and wash gently with a scrungie or a clean washcloth.  5. Apply the CHG soap to your body only from the neck down. Do not use on open wounds or open sores. Avoid contact with your eyes, ears, mouth, and genitals (private parts). Wash face and genitals (private parts) with your normal soap.  6. Wash thoroughly, paying special attention to the area where your surgery will be performed.  7. Thoroughly rinse your body with warm water.  8. Do not shower/wash with your normal soap after using and rinsing off the CHG soap.  9. Pat yourself dry with a clean towel.  10. Wear clean pajamas to bed the night before surgery.  12. Place clean sheets on  your bed the night of your first shower and do not sleep with pets.  13. Shower again with the CHG soap on the day of surgery prior to arriving at the hospital.  14. Do not apply any deodorants/lotions/powders.  15. Please wear clean clothes to the hospital.

## 2022-07-29 NOTE — H&P (Signed)
Gynecologic Oncology H&P  Chief Complaint: Adnexal Mass  HPI: Margaret Mercado is a 67 y.o. female who is seen in consultation from Dr. Leafy Ro for adnexal mass and elevated ca 125.   Patient was seen by PCP for abdominal pain that was thought to be related to a known hernia. 06/11/22 CT showed adnexal mass on right measuring 14.5 x 9.1 cm with fluid filled serpiginous tubular structure. Possible focal fluid in the fundal portion of the endometrial canal. Large fat containing ventral hernia with a 2cm aperture width. She was seen by Dr. Leafy Ro.    06/18/22- CA 125: 298.    07/07/22- US Pelvis Uterus: Measurements: 8.6 x 5.4 x 5.8 cm = volume: 140 mL Endometrium: 10.3 mm.  Right ovary: not visualized. 16.1 x 9.9 x 9.3 cm cystic right adnexal mass incompletely visualized Left ovary: not visualized No abnormal free fluid.    06/18/22 HmgA1c - 6.1   She has noticed some changes in caliber of stool. She has urinary urgency at baseline but feels it is worse over past year.   She saw Dr. Christian Mate on 07/20/2022 and he discussed hernia repair at the time of surgery.    CXR 07/14/2022  IMPRESSION: No active cardiopulmonary disease EKG 07/15/2022 EKG - SR/SB with no acute ischemic changes. Patient notified of results by Dr. Einar Pheasant.   Past Medical History:  Diagnosis Date   Abnormal echocardiogram 01/2009   Mild LVH, EF >55%, no regional wall motion abnormalities, normal RV, mild aortic insufficiency, no aortic stenosis.   Adnexal mass    Anxiety    Dyspnea    Heart murmur    Hemorrhoids    Hyperlipidemia    Hypertension    Edema with Norvasc. Unable to tolerate Lisinopril/ HCTZ.   Hypokalemia    Manifested by lip and finger tingling   Hypothyroidism    Palpitations    Event monitor 01/2009 with no significant arrhythmias   Sleep apnea    does not use cpap   TIA (transient ischemic attack) 01/2009   CT and MRI of head showed no evidence for stroke. Carotid dopplers showed no  significant plaque   Transient cerebral ischemia    Tricuspid valve insufficiency    Ventral hernia    Past Surgical History:  Procedure Laterality Date   CRYOABLATION     Of uterus   UMBILICAL HERNIA REPAIR  2010   Dr Nicholes Stairs   URETHRAL DILATION     removal of bladder polyps   Family History  Problem Relation Age of Onset   Hypertension Mother    Arrhythmia Mother        Atrial fibrillation   Hypertension Father    Valvular heart disease Father    Hypertension Brother    Hypertension Paternal Grandmother    Coronary artery disease Neg Hx        Premature   Breast cancer Neg Hx    Colon cancer Neg Hx    Social History:  reports that she has never smoked. She has never been exposed to tobacco smoke. She has never used smokeless tobacco. She reports that she does not drink alcohol and does not use drugs.  Allergies:  Allergies  Allergen Reactions   Vicodin [Hydrocodone-Acetaminophen] Anaphylaxis   Amlodipine Besylate    Cardizem [Diltiazem Hcl] Other (See Comments)    Flushing    Erythromycin Other (See Comments)    Chest heaviness    Hydrocodone-Acetaminophen    Levaquin [Levofloxacin In D5w] Other (See Comments)  Rapid heart beat   Lisinopril Other (See Comments)    Difficulty breathing and rash   Macrobid [Nitrofurantoin Macrocrystal] Other (See Comments)    Wheezing and headache   Oxycodone-Acetaminophen    Sulfonamide Derivatives Rash   (Not in a hospital admission)  No results found for this or any previous visit (from the past 48 hour(s)). No results found.  Review of Systems  Constitutional:  Negative for chills and fever.  HENT:  Negative for hearing loss, nosebleeds, sore throat and tinnitus.   Respiratory:  Negative for cough, shortness of breath and wheezing.   Cardiovascular:  Negative for chest pain, palpitations and leg swelling.  Gastrointestinal:  Negative for abdominal pain, anal bleeding, blood in stool, constipation, diarrhea, nausea  and vomiting.  Genitourinary:  Negative for dysuria, urgency and vaginal bleeding.  Musculoskeletal:  Negative for back pain and myalgias.  Skin:  Negative for rash.  Allergic/Immunologic: Negative for environmental allergies.  Neurological:  Negative for dizziness, weakness and headaches.  Hematological:  Negative for adenopathy. Does not bruise/bleed easily.  Psychiatric/Behavioral:  Negative for agitation and confusion. The patient is not nervous/anxious.     Blood pressure 139/76, pulse 76, temperature 98.7 F (37.1 C), resp. rate 20, weight 196 lb 11.2 oz (89.2 kg), SpO2 98 %.  GENERAL: Patient is a well appearing female in no acute distress. Accompanied.  HEART: regular rate and rhythm  LUNGS:  Normal respiratory effort.  ABDOMEN:  Soft, nontender, nondistended. Large pelvic mass extending from the pelvis to the umbilicus. 4-5 cm supraumbilical mass due to hernia. Non-reducible. Well healed transverse incision. No ascites or hepatosplenomegaly appreciated.  EXTREMITIES:  No peripheral edema.   NEURO:  Nonfocal. Well oriented.  Appropriate affect.   Assessment/Plan We discussed possible differential diagnosis including benign vs malignant ovarian cancer, endometrial malignancy given abnormal uterine findings on imaging with either metastatic disease to the ovary or benign ovarian cystic mass.   We discussed options for management and recommended surgery.  Dr. Christian Mate assess her for the hernia and hernia repair options.  He is able to assist with her surgery. We plan to attempt a minimally invasive approach with a hand-assisted port and either robotic or laparoscopy.  Control drainage would need to be performed in order to avoid laparotomy.  In addition laparotomy may be required in order to perform the procedure.   She was consented today for robotic assisted hysterectomy with bilateral salpingo-oophorectomy and possible staging based on frozen section intraoperative evaluation to  include omentectomy lymph node dissection and biopsies if needed.  Possible laparotomy.  Hernia repair will be contacted by Dr. Christian Mate  The risks of surgery were discussed in detail and she understands these risk to include but not limited to: infection, wound separation, hernia, vaginal cuff separation, injury to adjacent organs such as bowel, bladder, blood vessels, ureters, and/or nerves, bleeding which may require blood transfusion, anesthesia risk, thromboembolic events, possible death, unforeseen complications, possible need for re-exploration, medical complications such as heart attack, stroke, and pneumonia, and if staging performed the risk of lymphedema and lymphocyst. The patient will receive DVT and antibiotic prophylaxis as indicated. She voiced a clear understanding. She had the opportunity to ask questions and electronic informed consent was obtained today.    Hypertension seems to be a well controlled.     H/o hypothyroidism now on replacement with no concerning symptoms -repeat TSH normal.    History of mild LVH and TIA no concerning current symptoms.  Preoperative EKG and chest x-ray reassuring  Verlon Au, NP 07/29/2022, 4:26 PM  I personally had a face to face interaction and evaluated the patient jointly with the NP, Ms. Beckey Rutter.  I have reviewed her history and available records and have performed the key portions of the physical exam HEENT, general, abdominal exam, and neurologic exam with my findings confirming those documented above by the APP.  I have discussed the case with the APP and the patient.  I agree with the above documentation, assessment and plan which was fully formulated by me.  Counseling was completed by me.   I personally saw the patient and performed a substantive portion of this encounter in conjunction with the listed APP as documented above.  Angeles Gaetana Michaelis, MD

## 2022-07-29 NOTE — H&P (View-Only) (Signed)
Here to discuss updated plans for surgery.  We reviewed the possible scenarios for her hernia repair.  Depending upon what incisions/extraction sites are necessary for her hysterectomy and bilateral salpingo-oophorectomy and cystic ovarian mass removal, will determine primarily what needs to be closed at completion.  She has a small 2 cm recurrent defect.  She is certain she has had mesh repair back in 2010.  I have no idea where this is and it does not seem to be a current issue.  I reassured her that our goals and hernia repair is not removal of the old mesh and I believe she understands that that does not what we are about.  If it does complicate things we will clearly remove it in order to reapproximate her midline abdominal wall after her operation.  If everything remains minimally invasive we may still utilize a primary repair reinforced with mesh.  We may just complete a primary repair and avoid any risks of additional mesh depending upon how much diminished abdominal wall tension is present following her surgery.  Also discussed that surgery risks include recurrence which can be up to 30% in the case of complex hernias, use of prosthetic materials (mesh) and the increased risk of infection and the possible need for re-operation and removal of mesh, possibility of post-op SBO or ileus, and the risks of general anesthetic. The patient, and those present, appear to understand the risks, any and all questions were answered to the patient's satisfaction.  No guarantees were ever expressed or implied.

## 2022-07-29 NOTE — Telephone Encounter (Signed)
Received FMLA for patient husband. Form completed and sent for signature

## 2022-07-29 NOTE — Progress Notes (Signed)
Here to discuss updated plans for surgery.  We reviewed the possible scenarios for her hernia repair.  Depending upon what incisions/extraction sites are necessary for her hysterectomy and bilateral salpingo-oophorectomy and cystic ovarian mass removal, will determine primarily what needs to be closed at completion.  She has a small 2 cm recurrent defect.  She is certain she has had mesh repair back in 2010.  I have no idea where this is and it does not seem to be a current issue.  I reassured her that our goals and hernia repair is not removal of the old mesh and I believe she understands that that does not what we are about.  If it does complicate things we will clearly remove it in order to reapproximate her midline abdominal wall after her operation.  If everything remains minimally invasive we may still utilize a primary repair reinforced with mesh.  We may just complete a primary repair and avoid any risks of additional mesh depending upon how much diminished abdominal wall tension is present following her surgery.  Also discussed that surgery risks include recurrence which can be up to 30% in the case of complex hernias, use of prosthetic materials (mesh) and the increased risk of infection and the possible need for re-operation and removal of mesh, possibility of post-op SBO or ileus, and the risks of general anesthetic. The patient, and those present, appear to understand the risks, any and all questions were answered to the patient's satisfaction.  No guarantees were ever expressed or implied.

## 2022-07-29 NOTE — Telephone Encounter (Signed)
Surgery information reviewed with patient at her visit today.

## 2022-07-29 NOTE — H&P (View-Only) (Signed)
Gynecologic Oncology H&P  Chief Complaint: Adnexal Mass  HPI: Margaret Mercado is a 67 y.o. female who is seen in consultation from Dr. Leafy Ro for adnexal mass and elevated ca 125.   Patient was seen by PCP for abdominal pain that was thought to be related to a known hernia. 06/11/22 CT showed adnexal mass on right measuring 14.5 x 9.1 cm with fluid filled serpiginous tubular structure. Possible focal fluid in the fundal portion of the endometrial canal. Large fat containing ventral hernia with a 2cm aperture width. She was seen by Dr. Leafy Ro.    06/18/22- CA 125: 298.    07/07/22- US Pelvis Uterus: Measurements: 8.6 x 5.4 x 5.8 cm = volume: 140 mL Endometrium: 10.3 mm.  Right ovary: not visualized. 16.1 x 9.9 x 9.3 cm cystic right adnexal mass incompletely visualized Left ovary: not visualized No abnormal free fluid.    06/18/22 HmgA1c - 6.1   She has noticed some changes in caliber of stool. She has urinary urgency at baseline but feels it is worse over past year.   She saw Dr. Christian Mate on 07/20/2022 and he discussed hernia repair at the time of surgery.    CXR 07/14/2022  IMPRESSION: No active cardiopulmonary disease EKG 07/15/2022 EKG - SR/SB with no acute ischemic changes. Patient notified of results by Dr. Einar Pheasant.   Past Medical History:  Diagnosis Date   Abnormal echocardiogram 01/2009   Mild LVH, EF >55%, no regional wall motion abnormalities, normal RV, mild aortic insufficiency, no aortic stenosis.   Adnexal mass    Anxiety    Dyspnea    Heart murmur    Hemorrhoids    Hyperlipidemia    Hypertension    Edema with Norvasc. Unable to tolerate Lisinopril/ HCTZ.   Hypokalemia    Manifested by lip and finger tingling   Hypothyroidism    Palpitations    Event monitor 01/2009 with no significant arrhythmias   Sleep apnea    does not use cpap   TIA (transient ischemic attack) 01/2009   CT and MRI of head showed no evidence for stroke. Carotid dopplers showed no  significant plaque   Transient cerebral ischemia    Tricuspid valve insufficiency    Ventral hernia    Past Surgical History:  Procedure Laterality Date   CRYOABLATION     Of uterus   UMBILICAL HERNIA REPAIR  2010   Dr Nicholes Stairs   URETHRAL DILATION     removal of bladder polyps   Family History  Problem Relation Age of Onset   Hypertension Mother    Arrhythmia Mother        Atrial fibrillation   Hypertension Father    Valvular heart disease Father    Hypertension Brother    Hypertension Paternal Grandmother    Coronary artery disease Neg Hx        Premature   Breast cancer Neg Hx    Colon cancer Neg Hx    Social History:  reports that she has never smoked. She has never been exposed to tobacco smoke. She has never used smokeless tobacco. She reports that she does not drink alcohol and does not use drugs.  Allergies:  Allergies  Allergen Reactions   Vicodin [Hydrocodone-Acetaminophen] Anaphylaxis   Amlodipine Besylate    Cardizem [Diltiazem Hcl] Other (See Comments)    Flushing    Erythromycin Other (See Comments)    Chest heaviness    Hydrocodone-Acetaminophen    Levaquin [Levofloxacin In D5w] Other (See Comments)  Rapid heart beat   Lisinopril Other (See Comments)    Difficulty breathing and rash   Macrobid [Nitrofurantoin Macrocrystal] Other (See Comments)    Wheezing and headache   Oxycodone-Acetaminophen    Sulfonamide Derivatives Rash   (Not in a hospital admission)  No results found for this or any previous visit (from the past 48 hour(s)). No results found.  Review of Systems  Constitutional:  Negative for chills and fever.  HENT:  Negative for hearing loss, nosebleeds, sore throat and tinnitus.   Respiratory:  Negative for cough, shortness of breath and wheezing.   Cardiovascular:  Negative for chest pain, palpitations and leg swelling.  Gastrointestinal:  Negative for abdominal pain, anal bleeding, blood in stool, constipation, diarrhea, nausea  and vomiting.  Genitourinary:  Negative for dysuria, urgency and vaginal bleeding.  Musculoskeletal:  Negative for back pain and myalgias.  Skin:  Negative for rash.  Allergic/Immunologic: Negative for environmental allergies.  Neurological:  Negative for dizziness, weakness and headaches.  Hematological:  Negative for adenopathy. Does not bruise/bleed easily.  Psychiatric/Behavioral:  Negative for agitation and confusion. The patient is not nervous/anxious.     Blood pressure 139/76, pulse 76, temperature 98.7 F (37.1 C), resp. rate 20, weight 196 lb 11.2 oz (89.2 kg), SpO2 98 %.  GENERAL: Patient is a well appearing female in no acute distress. Accompanied.  HEART: regular rate and rhythm  LUNGS:  Normal respiratory effort.  ABDOMEN:  Soft, nontender, nondistended. Large pelvic mass extending from the pelvis to the umbilicus. 4-5 cm supraumbilical mass due to hernia. Non-reducible. Well healed transverse incision. No ascites or hepatosplenomegaly appreciated.  EXTREMITIES:  No peripheral edema.   NEURO:  Nonfocal. Well oriented.  Appropriate affect.   Assessment/Plan We discussed possible differential diagnosis including benign vs malignant ovarian cancer, endometrial malignancy given abnormal uterine findings on imaging with either metastatic disease to the ovary or benign ovarian cystic mass.   We discussed options for management and recommended surgery.  Dr. Christian Mate assess her for the hernia and hernia repair options.  He is able to assist with her surgery. We plan to attempt a minimally invasive approach with a hand-assisted port and either robotic or laparoscopy.  Control drainage would need to be performed in order to avoid laparotomy.  In addition laparotomy may be required in order to perform the procedure.   She was consented today for robotic assisted hysterectomy with bilateral salpingo-oophorectomy and possible staging based on frozen section intraoperative evaluation to  include omentectomy lymph node dissection and biopsies if needed.  Possible laparotomy.  Hernia repair will be contacted by Dr. Christian Mate  The risks of surgery were discussed in detail and she understands these risk to include but not limited to: infection, wound separation, hernia, vaginal cuff separation, injury to adjacent organs such as bowel, bladder, blood vessels, ureters, and/or nerves, bleeding which may require blood transfusion, anesthesia risk, thromboembolic events, possible death, unforeseen complications, possible need for re-exploration, medical complications such as heart attack, stroke, and pneumonia, and if staging performed the risk of lymphedema and lymphocyst. The patient will receive DVT and antibiotic prophylaxis as indicated. She voiced a clear understanding. She had the opportunity to ask questions and electronic informed consent was obtained today.    Hypertension seems to be a well controlled.     H/o hypothyroidism now on replacement with no concerning symptoms -repeat TSH normal.    History of mild LVH and TIA no concerning current symptoms.  Preoperative EKG and chest x-ray reassuring  Verlon Au, NP 07/29/2022, 4:26 PM  I personally had a face to face interaction and evaluated the patient jointly with the NP, Ms. Beckey Rutter.  I have reviewed her history and available records and have performed the key portions of the physical exam HEENT, general, abdominal exam, and neurologic exam with my findings confirming those documented above by the APP.  I have discussed the case with the APP and the patient.  I agree with the above documentation, assessment and plan which was fully formulated by me.  Counseling was completed by me.   I personally saw the patient and performed a substantive portion of this encounter in conjunction with the listed APP as documented above.  Angeles Gaetana Michaelis, MD

## 2022-07-29 NOTE — Patient Instructions (Signed)
If you have any concerns or questions, please feel free to call our office.   Laparoscopic Ventral Hernia Repair Laparoscopic ventral hernia repairis a procedure to fix a bulge of tissue that pushes through a weak area of muscle in the abdomen (ventral hernia). A ventral hernia may be: Above the belly button. This is called an epigastric hernia. At the belly button. This is called an umbilical hernia. At the incision site from previous abdominal surgery. This is called an incisional hernia. You may have this procedure as emergency surgery if part of your intestine gets trapped inside the hernia and starts to lose its blood supply (strangulation). Laparoscopic surgery is done through small incisions using a thin surgical telescope with a light and camera (laparoscope). During surgery, your surgeon will use images from the laparoscope to guide the procedure. A mesh screen will be placed in the hernia to close the opening and strengthen the abdominal wall. Tell a health care provider about: Any allergies you have. All medicines you are taking, including vitamins, herbs, eye drops, creams, and over-the-counter medicines. Any problems you or family members have had with anesthetic medicines. Any blood disorders you have. Any surgeries you have had. Any medical conditions you have. Whether you are pregnant or may be pregnant. What are the risks? Generally, this is a safe procedure. However, problems may occur, including: Infection. Bleeding. Damage to nearby structures or organs in the abdomen. Trouble urinating or having a bowel movement after surgery. Blood clots. The hernia coming back after surgery. Fluid buildup in the area of the hernia. In some cases, your health care provider may need to switch from a laparoscopic procedure to a procedure that is done through a single, larger incision in the abdomen (open procedure). You may need an open procedure if: You have a hernia that is difficult  to repair. Your organs are hard to see with the laparoscope. You have bleeding problems during the laparoscopic procedure. What happens before the procedure? Staying hydrated Follow instructions from your health care provider about hydration, which may include: Up to 2 hours before the procedure - you may continue to drink clear liquids, such as water, clear fruit juice, black coffee, and plain tea.  Eating and drinking restrictions Follow instructions from your health care provider about eating and drinking, which may include: 8 hours before the procedure - stop eating heavy meals or foods, such as meat, fried foods, or fatty foods. 6 hours before the procedure - stop eating light meals or foods, such as toast or cereal. 6 hours before the procedure - stop drinking milk or drinks that contain milk. 2 hours before the procedure - stop drinking clear liquids. Medicines Ask your health care provider about: Changing or stopping your regular medicines. This is especially important if you are taking diabetes medicines or blood thinners. Taking medicines such as aspirin and ibuprofen. These medicines can thin your blood. Do not take these medicines unless your health care provider tells you to take them. Taking over-the-counter medicines, vitamins, herbs, and supplements. Tests You may need to have tests before the procedure, such as: Blood tests. Urine tests. Abdominal ultrasound. Chest X-ray. Electrocardiogram (ECG). General instructions You may be asked to take a laxative or do an enema to empty your bowel before surgery (bowel prep). Do not use any products that contain nicotine or tobacco for at least 4 weeks before the procedure. These products include cigarettes, chewing tobacco, and vaping devices, such as e-cigarettes. If you need help quitting, ask   your health care provider. Ask your health care provider: How your surgery site will be marked. What steps will be taken to help  prevent infection. These steps may include: Removing hair at the surgery site. Washing skin with a germ-killing soap. Receiving antibiotic medicine. Plan to have a responsible adult take you home from the hospital or clinic. If you will be going home right after the procedure, plan to have a responsible adult care for you for the time you are told. This is important. What happens during the procedure?  An IV will be inserted into one of your veins. You will be given one or more of the following: A medicine to help you relax (sedative). A medicine to numb the area (local anesthetic). A medicine to make you fall asleep (general anesthetic). A small incision will be made in your abdomen. A hollow metal tube (trocar) will be placed through the incision. A tube will be placed through the trocar to inflate your abdomen with carbon dioxide. This makes it easier for your surgeon to see inside your abdomen during the repair. A laparoscope will be inserted into your abdomen through the trocar. The laparoscope will send images to a monitor in the operating room. Other trocars will be put through other small incisions in your abdomen. The surgical instruments needed for the procedure will be placed through these trocars. The tissue or intestines that make up the hernia will be moved back into place. The edges of the hernia may be stitched (sutured) together. A piece of mesh will be used to close the hernia. Sutures, clips, or staples will be used to keep the mesh in place. A bandage (dressing) or skin glue will be put over the incisions. The procedure may vary among health care providers and hospitals. What happens after the procedure? Your blood pressure, heart rate, breathing rate, and blood oxygen level will be monitored until you leave the hospital or clinic. You will continue to receive fluids and medicines through an IV. Your IV will be removed when you can drink clear fluids. You will be given  pain medicine as needed. You will be encouraged to get up and walk around as soon as possible. You will be shown how to do deep breathing exercises to help prevent a lung infection. If you were given a sedative during the procedure, it can affect you for several hours. Do not drive or operate machinery until your health care provider says that it is safe. Summary Laparoscopic ventral hernia is an operation to fix a hernia using small incisions. Tell your health care provider about other medical conditions that you have and about all the medicines that you are taking. Follow instructions from your health care provider about eating and drinking before the procedure. Plan to have a responsible adult take you home from the hospital or clinic. After the procedure, you will be encouraged to walk as soon as possible. You will also be taught how to do deep breathing exercises. This information is not intended to replace advice given to you by your health care provider. Make sure you discuss any questions you have with your health care provider. Document Revised: 01/18/2020 Document Reviewed: 01/18/2020 Elsevier Patient Education  2023 [employer]  

## 2022-07-29 NOTE — Telephone Encounter (Signed)
FMLA completed and patient caled to let her know it is ready, I had to leave voice mail message

## 2022-07-30 ENCOUNTER — Other Ambulatory Visit: Payer: 59

## 2022-08-02 ENCOUNTER — Encounter
Admission: RE | Admit: 2022-08-02 | Discharge: 2022-08-02 | Disposition: A | Discharge status: Home / Self Care | Payer: 59 | Source: Ambulatory Visit | Attending: Obstetrics and Gynecology | Admitting: Obstetrics and Gynecology

## 2022-08-02 ENCOUNTER — Other Ambulatory Visit
Admission: RE | Admit: 2022-08-02 | Discharge: 2022-08-02 | Disposition: A | Discharge status: Home / Self Care | Payer: 59 | Source: Ambulatory Visit | Attending: Urgent Care | Admitting: Urgent Care

## 2022-08-02 DIAGNOSIS — I10 Essential (primary) hypertension: Secondary | ICD-10-CM | POA: Insufficient documentation

## 2022-08-02 DIAGNOSIS — Z79899 Other long term (current) drug therapy: Secondary | ICD-10-CM | POA: Insufficient documentation

## 2022-08-02 DIAGNOSIS — Z01812 Encounter for preprocedural laboratory examination: Secondary | ICD-10-CM | POA: Diagnosis not present

## 2022-08-02 LAB — BASIC METABOLIC PANEL
Anion gap: 7 (ref 5–15)
BUN: 18 mg/dL (ref 8–23)
CO2: 27 mmol/L (ref 22–32)
Calcium: 8.9 mg/dL (ref 8.9–10.3)
Chloride: 106 mmol/L (ref 98–111)
Creatinine, Ser: 0.65 mg/dL (ref 0.44–1.00)
GFR, Estimated: >60 mL/min (ref >60)
Glucose, Bld: 113 mg/dL — ABNORMAL HIGH (ref 70–99)
Potassium: 4.1 mmol/L (ref 3.5–5.1)
Sodium: 140 mmol/L (ref 135–145)

## 2022-08-02 LAB — TYPE AND SCREEN
ABO/RH(D): O POS
Antibody Screen: NEGATIVE

## 2022-08-04 ENCOUNTER — Ambulatory Visit: Payer: 59 | Admitting: Urgent Care

## 2022-08-04 ENCOUNTER — Other Ambulatory Visit: Payer: Self-pay

## 2022-08-04 ENCOUNTER — Observation Stay
Admission: RE | Admit: 2022-08-04 | Discharge: 2022-08-05 | Disposition: A | Discharge status: Home / Self Care | Payer: 59 | Attending: Obstetrics and Gynecology | Admitting: Obstetrics and Gynecology

## 2022-08-04 ENCOUNTER — Encounter: Payer: Self-pay | Admitting: Obstetrics and Gynecology

## 2022-08-04 ENCOUNTER — Encounter
Admission: RE | Disposition: A | Discharge status: Home / Self Care | Payer: Self-pay | Source: Home / Self Care | Attending: Obstetrics and Gynecology

## 2022-08-04 DIAGNOSIS — K420 Umbilical hernia with obstruction, without gangrene: Secondary | ICD-10-CM | POA: Diagnosis present

## 2022-08-04 DIAGNOSIS — N736 Female pelvic peritoneal adhesions (postinfective): Secondary | ICD-10-CM | POA: Diagnosis not present

## 2022-08-04 DIAGNOSIS — N83201 Unspecified ovarian cyst, right side: Secondary | ICD-10-CM | POA: Insufficient documentation

## 2022-08-04 DIAGNOSIS — K439 Ventral hernia without obstruction or gangrene: Secondary | ICD-10-CM | POA: Diagnosis not present

## 2022-08-04 DIAGNOSIS — E039 Hypothyroidism, unspecified: Secondary | ICD-10-CM | POA: Insufficient documentation

## 2022-08-04 DIAGNOSIS — N83299 Other ovarian cyst, unspecified side: Secondary | ICD-10-CM | POA: Diagnosis present

## 2022-08-04 DIAGNOSIS — N838 Other noninflammatory disorders of ovary, fallopian tube and broad ligament: Secondary | ICD-10-CM | POA: Diagnosis not present

## 2022-08-04 DIAGNOSIS — C541 Malignant neoplasm of endometrium: Principal | ICD-10-CM

## 2022-08-04 DIAGNOSIS — I10 Essential (primary) hypertension: Secondary | ICD-10-CM | POA: Insufficient documentation

## 2022-08-04 DIAGNOSIS — Z79899 Other long term (current) drug therapy: Secondary | ICD-10-CM | POA: Diagnosis not present

## 2022-08-04 DIAGNOSIS — Z6836 Body mass index (BMI) 36.0-36.9, adult: Secondary | ICD-10-CM | POA: Diagnosis not present

## 2022-08-04 DIAGNOSIS — K430 Incisional hernia with obstruction, without gangrene: Secondary | ICD-10-CM | POA: Diagnosis not present

## 2022-08-04 DIAGNOSIS — Z8673 Personal history of transient ischemic attack (TIA), and cerebral infarction without residual deficits: Secondary | ICD-10-CM | POA: Insufficient documentation

## 2022-08-04 DIAGNOSIS — N9489 Other specified conditions associated with female genital organs and menstrual cycle: Secondary | ICD-10-CM

## 2022-08-04 DIAGNOSIS — E669 Obesity, unspecified: Secondary | ICD-10-CM | POA: Diagnosis not present

## 2022-08-04 HISTORY — PX: VENTRAL HERNIA REPAIR: SHX424

## 2022-08-04 HISTORY — PX: ROBOTIC ASSISTED TOTAL HYSTERECTOMY WITH BILATERAL SALPINGO OOPHERECTOMY: SHX6086

## 2022-08-04 HISTORY — PX: CYSTOSCOPY: SHX5120

## 2022-08-04 LAB — CBC
HCT: 37.2 % (ref 36.0–46.0)
Hemoglobin: 12.4 g/dL (ref 12.0–15.0)
MCH: 31.7 pg (ref 26.0–34.0)
MCHC: 33.3 g/dL (ref 30.0–36.0)
MCV: 95.1 fL (ref 80.0–100.0)
Platelets: 221 10*3/uL (ref 150–400)
RBC: 3.91 MIL/uL (ref 3.87–5.11)
RDW: 12.9 % (ref 11.5–15.5)
WBC: 10.8 10*3/uL — ABNORMAL HIGH (ref 4.0–10.5)
nRBC: 0 % (ref 0.0–0.2)

## 2022-08-04 LAB — BASIC METABOLIC PANEL
Anion gap: 7 (ref 5–15)
BUN: 13 mg/dL (ref 8–23)
CO2: 24 mmol/L (ref 22–32)
Calcium: 8.4 mg/dL — ABNORMAL LOW (ref 8.9–10.3)
Chloride: 103 mmol/L (ref 98–111)
Creatinine, Ser: 0.75 mg/dL (ref 0.44–1.00)
GFR, Estimated: >60 mL/min (ref >60)
Glucose, Bld: 188 mg/dL — ABNORMAL HIGH (ref 70–99)
Potassium: 3.7 mmol/L (ref 3.5–5.1)
Sodium: 134 mmol/L — ABNORMAL LOW (ref 135–145)

## 2022-08-04 LAB — ABO/RH: ABO/RH(D): O POS

## 2022-08-04 SURGERY — HYSTERECTOMY, TOTAL, ROBOT-ASSISTED, LAPAROSCOPIC, WITH BILATERAL SALPINGO-OOPHORECTOMY
Anesthesia: General

## 2022-08-04 MED ORDER — LACTATED RINGERS IV SOLN: INTRAVENOUS | Status: DC | PRN

## 2022-08-04 MED ORDER — DEXAMETHASONE SODIUM PHOSPHATE 10 MG/ML IJ SOLN
INTRAMUSCULAR | Status: AC
  Filled 2022-08-04: qty 1

## 2022-08-04 MED ORDER — ALUM & MAG HYDROXIDE-SIMETH 200-200-20 MG/5ML PO SUSP: 30.0000 mL | ORAL | Status: DC | PRN

## 2022-08-04 MED ORDER — FENTANYL CITRATE PF 50 MCG/ML IJ SOSY
100.0000 ug | PREFILLED_SYRINGE | Freq: Once | INTRAMUSCULAR | Status: AC
  Administered 2022-08-04: 50 ug via INTRAVENOUS

## 2022-08-04 MED ORDER — SUGAMMADEX SODIUM 200 MG/2ML IV SOLN
INTRAVENOUS | Status: DC | PRN
  Administered 2022-08-04: 200 mg via INTRAVENOUS

## 2022-08-04 MED ORDER — ONDANSETRON HCL 4 MG PO TABS
4.0000 mg | ORAL_TABLET | Freq: Four times a day (QID) | ORAL | Status: DC | PRN
  Administered 2022-08-05: 4 mg via ORAL
  Filled 2022-08-04: qty 1

## 2022-08-04 MED ORDER — PHENYLEPHRINE 80 MCG/ML (10ML) SYRINGE FOR IV PUSH (FOR BLOOD PRESSURE SUPPORT)
PREFILLED_SYRINGE | INTRAVENOUS | Status: AC
  Filled 2022-08-04: qty 10

## 2022-08-04 MED ORDER — CHLORHEXIDINE GLUCONATE 0.12 % MT SOLN
OROMUCOSAL | Status: AC
  Administered 2022-08-04: 15 mL via OROMUCOSAL
  Filled 2022-08-04: qty 15

## 2022-08-04 MED ORDER — CEFAZOLIN SODIUM-DEXTROSE 2-4 GM/100ML-% IV SOLN
2.0000 g | INTRAVENOUS | Status: AC
  Administered 2022-08-04 (×2): 2 g via INTRAVENOUS

## 2022-08-04 MED ORDER — KETAMINE HCL 50 MG/5ML IJ SOSY
PREFILLED_SYRINGE | INTRAMUSCULAR | Status: AC
  Filled 2022-08-04: qty 5

## 2022-08-04 MED ORDER — ROCURONIUM BROMIDE 100 MG/10ML IV SOLN
INTRAVENOUS | Status: DC | PRN
  Administered 2022-08-04 (×4): 20 mg via INTRAVENOUS
  Administered 2022-08-04: 30 mg via INTRAVENOUS
  Administered 2022-08-04: 50 mg via INTRAVENOUS
  Administered 2022-08-04 (×3): 20 mg via INTRAVENOUS

## 2022-08-04 MED ORDER — ROCURONIUM BROMIDE 10 MG/ML (PF) SYRINGE
PREFILLED_SYRINGE | INTRAVENOUS | Status: AC
  Filled 2022-08-04: qty 10

## 2022-08-04 MED ORDER — LIDOCAINE HCL (CARDIAC) PF 100 MG/5ML IV SOSY
PREFILLED_SYRINGE | INTRAVENOUS | Status: DC | PRN
  Administered 2022-08-04: 80 mg via INTRAVENOUS

## 2022-08-04 MED ORDER — METRONIDAZOLE 500 MG/100ML IV SOLN
500.0000 mg | INTRAVENOUS | Status: AC
  Administered 2022-08-04: 500 mg via INTRAVENOUS
  Filled 2022-08-04 (×2): qty 100

## 2022-08-04 MED ORDER — EPINEPHRINE 1 MG/10ML IJ SOSY
PREFILLED_SYRINGE | INTRAMUSCULAR | Status: DC | PRN
  Administered 2022-08-04: 10 mL

## 2022-08-04 MED ORDER — MENTHOL 3 MG MT LOZG: 1.0000 | LOZENGE | OROMUCOSAL | Status: DC | PRN

## 2022-08-04 MED ORDER — SIMETHICONE 80 MG PO CHEW
80.0000 mg | CHEWABLE_TABLET | Freq: Four times a day (QID) | ORAL | Status: DC | PRN
  Administered 2022-08-04 – 2022-08-05 (×2): 80 mg via ORAL
  Filled 2022-08-04 (×2): qty 1

## 2022-08-04 MED ORDER — METHYLENE BLUE 1 % INJ SOLN
INTRAVENOUS | Status: AC
  Filled 2022-08-04: qty 10

## 2022-08-04 MED ORDER — MIDAZOLAM HCL 2 MG/2ML IJ SOLN
INTRAMUSCULAR | Status: AC
  Filled 2022-08-04: qty 2

## 2022-08-04 MED ORDER — GABAPENTIN 300 MG PO CAPS: 300.0000 mg | ORAL_CAPSULE | ORAL | Status: AC

## 2022-08-04 MED ORDER — FENTANYL CITRATE (PF) 250 MCG/5ML IJ SOLN
INTRAMUSCULAR | Status: AC
  Filled 2022-08-04: qty 5

## 2022-08-04 MED ORDER — CHLORHEXIDINE GLUCONATE CLOTH 2 % EX PADS: 6.0000 | MEDICATED_PAD | Freq: Once | CUTANEOUS | Status: DC

## 2022-08-04 MED ORDER — CHLORHEXIDINE GLUCONATE 0.12 % MT SOLN: 15.0000 mL | Freq: Once | OROMUCOSAL | Status: AC

## 2022-08-04 MED ORDER — PROPOFOL 10 MG/ML IV BOLUS
INTRAVENOUS | Status: DC | PRN
  Administered 2022-08-04: 120 mg via INTRAVENOUS

## 2022-08-04 MED ORDER — LACTATED RINGERS IV SOLN: INTRAVENOUS | Status: DC

## 2022-08-04 MED ORDER — ONDANSETRON HCL 4 MG/2ML IJ SOLN: 4.0000 mg | Freq: Once | INTRAMUSCULAR | Status: DC | PRN

## 2022-08-04 MED ORDER — KETOROLAC TROMETHAMINE 30 MG/ML IJ SOLN
15.0000 mg | Freq: Four times a day (QID) | INTRAMUSCULAR | Status: AC
  Administered 2022-08-04 – 2022-08-05 (×2): 15 mg via INTRAVENOUS
  Filled 2022-08-04 (×2): qty 1

## 2022-08-04 MED ORDER — SEVOFLURANE IN SOLN
RESPIRATORY_TRACT | Status: AC
  Filled 2022-08-04: qty 250

## 2022-08-04 MED ORDER — MIDAZOLAM HCL 2 MG/2ML IJ SOLN
INTRAMUSCULAR | Status: DC | PRN
  Administered 2022-08-04: 2 mg via INTRAVENOUS

## 2022-08-04 MED ORDER — FENTANYL CITRATE (PF) 100 MCG/2ML IJ SOLN
INTRAMUSCULAR | Status: DC | PRN
  Administered 2022-08-04 (×4): 50 ug via INTRAVENOUS
  Administered 2022-08-04: 100 ug via INTRAVENOUS

## 2022-08-04 MED ORDER — BUPIVACAINE HCL (PF) 0.5 % IJ SOLN
INTRAMUSCULAR | Status: DC | PRN
  Administered 2022-08-04: 15 mL

## 2022-08-04 MED ORDER — 0.9 % SODIUM CHLORIDE (POUR BTL) OPTIME
TOPICAL | Status: DC | PRN
  Administered 2022-08-04: 150 mL

## 2022-08-04 MED ORDER — IBUPROFEN 600 MG PO TABS
600.0000 mg | ORAL_TABLET | Freq: Four times a day (QID) | ORAL | Status: DC
  Administered 2022-08-05: 600 mg via ORAL
  Filled 2022-08-04: qty 1

## 2022-08-04 MED ORDER — KETOROLAC TROMETHAMINE 30 MG/ML IJ SOLN
INTRAMUSCULAR | Status: DC | PRN
  Administered 2022-08-04: 30 mg via INTRAVENOUS

## 2022-08-04 MED ORDER — CEFAZOLIN SODIUM-DEXTROSE 2-4 GM/100ML-% IV SOLN: 2.0000 g | INTRAVENOUS | Status: DC

## 2022-08-04 MED ORDER — ASPIRIN 81 MG PO TBEC
81.0000 mg | DELAYED_RELEASE_TABLET | Freq: Every day | ORAL | Status: DC
  Administered 2022-08-05: 81 mg via ORAL
  Filled 2022-08-04: qty 1

## 2022-08-04 MED ORDER — HEPARIN SODIUM (PORCINE) 5000 UNIT/ML IJ SOLN
INTRAMUSCULAR | Status: AC
  Administered 2022-08-04: 5000 [IU] via SUBCUTANEOUS
  Filled 2022-08-04: qty 1

## 2022-08-04 MED ORDER — HYDROMORPHONE HCL 1 MG/ML IJ SOLN
0.5000 mg | INTRAMUSCULAR | Status: DC | PRN
  Administered 2022-08-04 – 2022-08-05 (×3): 0.5 mg via INTRAVENOUS
  Filled 2022-08-04 (×2): qty 0.5
  Filled 2022-08-04: qty 1

## 2022-08-04 MED ORDER — EPINEPHRINE PF 1 MG/ML IJ SOLN
INTRAMUSCULAR | Status: AC
  Filled 2022-08-04: qty 1

## 2022-08-04 MED ORDER — FENTANYL CITRATE (PF) 100 MCG/2ML IJ SOLN: 25.0000 ug | INTRAMUSCULAR | Status: DC | PRN

## 2022-08-04 MED ORDER — LEVOTHYROXINE SODIUM 50 MCG PO TABS
50.0000 ug | ORAL_TABLET | Freq: Every day | ORAL | Status: DC
  Administered 2022-08-05: 50 ug via ORAL
  Filled 2022-08-04: qty 1

## 2022-08-04 MED ORDER — OXYCODONE HCL 5 MG PO TABS: 5.0000 mg | ORAL_TABLET | Freq: Once | ORAL | Status: DC | PRN

## 2022-08-04 MED ORDER — CEFAZOLIN SODIUM 1 G IJ SOLR
INTRAMUSCULAR | Status: AC
  Filled 2022-08-04: qty 20

## 2022-08-04 MED ORDER — PROPOFOL 10 MG/ML IV BOLUS
INTRAVENOUS | Status: AC
  Filled 2022-08-04: qty 40

## 2022-08-04 MED ORDER — PHENYLEPHRINE 80 MCG/ML (10ML) SYRINGE FOR IV PUSH (FOR BLOOD PRESSURE SUPPORT)
PREFILLED_SYRINGE | INTRAVENOUS | Status: DC | PRN
  Administered 2022-08-04: 160 ug via INTRAVENOUS

## 2022-08-04 MED ORDER — FENTANYL CITRATE PF 50 MCG/ML IJ SOSY
PREFILLED_SYRINGE | INTRAMUSCULAR | Status: AC
  Filled 2022-08-04: qty 1

## 2022-08-04 MED ORDER — ACETAMINOPHEN 10 MG/ML IV SOLN: 1000.0000 mg | Freq: Once | INTRAVENOUS | Status: DC | PRN

## 2022-08-04 MED ORDER — ONDANSETRON HCL 4 MG/2ML IJ SOLN
4.0000 mg | Freq: Four times a day (QID) | INTRAMUSCULAR | Status: DC | PRN
  Administered 2022-08-04 (×2): 4 mg via INTRAVENOUS
  Filled 2022-08-04 (×2): qty 2

## 2022-08-04 MED ORDER — SERTRALINE HCL 25 MG PO TABS
50.0000 mg | ORAL_TABLET | ORAL | Status: DC
  Administered 2022-08-05: 50 mg via ORAL
  Filled 2022-08-04 (×3): qty 2

## 2022-08-04 MED ORDER — BUPIVACAINE HCL (PF) 0.5 % IJ SOLN
INTRAMUSCULAR | Status: AC
  Filled 2022-08-04: qty 30

## 2022-08-04 MED ORDER — ACETAMINOPHEN 10 MG/ML IV SOLN
INTRAVENOUS | Status: DC | PRN
  Administered 2022-08-04: 1000 mg via INTRAVENOUS

## 2022-08-04 MED ORDER — STERILE WATER FOR IRRIGATION IR SOLN
Status: DC | PRN
  Administered 2022-08-04: 1500 mL

## 2022-08-04 MED ORDER — DEXAMETHASONE SODIUM PHOSPHATE 10 MG/ML IJ SOLN
INTRAMUSCULAR | Status: DC | PRN
  Administered 2022-08-04: 10 mg via INTRAVENOUS

## 2022-08-04 MED ORDER — ORAL CARE MOUTH RINSE: 15.0000 mL | Freq: Once | OROMUCOSAL | Status: AC

## 2022-08-04 MED ORDER — GABAPENTIN 100 MG PO CAPS
100.0000 mg | ORAL_CAPSULE | Freq: Every day | ORAL | Status: DC
  Administered 2022-08-04: 100 mg via ORAL
  Filled 2022-08-04: qty 1

## 2022-08-04 MED ORDER — FENTANYL CITRATE (PF) 100 MCG/2ML IJ SOLN
INTRAMUSCULAR | Status: AC
  Filled 2022-08-04: qty 2

## 2022-08-04 MED ORDER — BUPIVACAINE LIPOSOME 1.3 % IJ SUSP
INTRAMUSCULAR | Status: AC
  Filled 2022-08-04: qty 20

## 2022-08-04 MED ORDER — BUPIVACAINE HCL (PF) 0.25 % IJ SOLN
INTRAMUSCULAR | Status: AC
  Filled 2022-08-04: qty 30

## 2022-08-04 MED ORDER — EPHEDRINE SULFATE (PRESSORS) 50 MG/ML IJ SOLN
INTRAMUSCULAR | Status: DC | PRN
  Administered 2022-08-04: 10 mg via INTRAVENOUS

## 2022-08-04 MED ORDER — KETOROLAC TROMETHAMINE 30 MG/ML IJ SOLN
INTRAMUSCULAR | Status: AC
  Filled 2022-08-04: qty 1

## 2022-08-04 MED ORDER — FAMOTIDINE 20 MG PO TABS: 20.0000 mg | ORAL_TABLET | Freq: Once | ORAL | Status: DC

## 2022-08-04 MED ORDER — SODIUM CHLORIDE 0.9 % IV SOLN: INTRAVENOUS | Status: DC

## 2022-08-04 MED ORDER — ONDANSETRON HCL 4 MG/2ML IJ SOLN
INTRAMUSCULAR | Status: AC
  Filled 2022-08-04: qty 2

## 2022-08-04 MED ORDER — ONDANSETRON HCL 4 MG/2ML IJ SOLN
INTRAMUSCULAR | Status: DC | PRN
  Administered 2022-08-04: 4 mg via INTRAVENOUS

## 2022-08-04 MED ORDER — LIDOCAINE HCL (PF) 2 % IJ SOLN
INTRAMUSCULAR | Status: AC
  Filled 2022-08-04: qty 5

## 2022-08-04 MED ORDER — POVIDONE-IODINE 10 % EX SWAB
2.0000 | Freq: Once | CUTANEOUS | Status: AC
  Administered 2022-08-04: 2 via TOPICAL

## 2022-08-04 MED ORDER — GABAPENTIN 300 MG PO CAPS
ORAL_CAPSULE | ORAL | Status: AC
  Administered 2022-08-04: 300 mg via ORAL
  Filled 2022-08-04: qty 1

## 2022-08-04 MED ORDER — PHENYLEPHRINE HCL-NACL 20-0.9 MG/250ML-% IV SOLN
INTRAVENOUS | Status: AC
  Filled 2022-08-04: qty 250

## 2022-08-04 MED ORDER — ACETAMINOPHEN 10 MG/ML IV SOLN
INTRAVENOUS | Status: AC
  Filled 2022-08-04: qty 100

## 2022-08-04 MED ORDER — DOCUSATE SODIUM 100 MG PO CAPS
100.0000 mg | ORAL_CAPSULE | Freq: Two times a day (BID) | ORAL | Status: DC
  Administered 2022-08-05: 100 mg via ORAL
  Filled 2022-08-04 (×2): qty 1

## 2022-08-04 MED ORDER — CEFAZOLIN SODIUM-DEXTROSE 2-4 GM/100ML-% IV SOLN
INTRAVENOUS | Status: AC
  Filled 2022-08-04: qty 100

## 2022-08-04 MED ORDER — HYDROCHLOROTHIAZIDE 25 MG PO TABS
12.5000 mg | ORAL_TABLET | Freq: Every day | ORAL | Status: DC
  Administered 2022-08-04: 12.5 mg via ORAL
  Filled 2022-08-04: qty 1

## 2022-08-04 MED ORDER — FAMOTIDINE 20 MG PO TABS
ORAL_TABLET | ORAL | Status: AC
  Filled 2022-08-04: qty 1

## 2022-08-04 MED ORDER — OXYCODONE HCL 5 MG/5ML PO SOLN: 5.0000 mg | Freq: Once | ORAL | Status: DC | PRN

## 2022-08-04 MED ORDER — KETAMINE HCL 10 MG/ML IJ SOLN
INTRAMUSCULAR | Status: DC | PRN
  Administered 2022-08-04: 30 mg via INTRAVENOUS
  Administered 2022-08-04 (×2): 10 mg via INTRAVENOUS

## 2022-08-04 MED ORDER — BUPIVACAINE LIPOSOME 1.3 % IJ SUSP: 20.0000 mL | Freq: Once | INTRAMUSCULAR | Status: DC

## 2022-08-04 MED ORDER — ROSUVASTATIN CALCIUM 10 MG PO TABS
10.0000 mg | ORAL_TABLET | ORAL | Status: DC
  Administered 2022-08-05: 10 mg via ORAL
  Filled 2022-08-04: qty 1

## 2022-08-04 MED ORDER — PHENYLEPHRINE HCL-NACL 20-0.9 MG/250ML-% IV SOLN
INTRAVENOUS | Status: DC | PRN
  Administered 2022-08-04: 30 ug/min via INTRAVENOUS
  Administered 2022-08-04: 15 ug/min via INTRAVENOUS

## 2022-08-04 MED ORDER — NEBIVOLOL HCL 5 MG PO TABS
2.5000 mg | ORAL_TABLET | ORAL | Status: DC
  Filled 2022-08-04 (×2): qty 1

## 2022-08-04 MED ORDER — HEPARIN SODIUM (PORCINE) 5000 UNIT/ML IJ SOLN: 5000.0000 [IU] | Freq: Once | INTRAMUSCULAR | Status: AC

## 2022-08-04 MED ORDER — SODIUM CHLORIDE 0.9 % IR SOLN
Status: DC | PRN
  Administered 2022-08-04: 200 mL
  Administered 2022-08-04: 300 mL

## 2022-08-04 SURGICAL SUPPLY — 81 items
BAG LAPAROSCOPIC 12 15 PORT 16 (BASKET) IMPLANT
BAG RETRIEVAL 12/15 (BASKET) ×1
BAG URINE DRAIN 2000ML AR STRL (UROLOGICAL SUPPLIES) ×1 IMPLANT
BINDER ABDOMINAL 12 ML 46-62 (SOFTGOODS) IMPLANT
CANNULA CAP OBTURATR AIRSEAL 8 (CAP) ×1 IMPLANT
CANNULA DILATOR 5 W/SLV (CANNULA) IMPLANT
CATH FOLEY 2WAY  5CC 16FR (CATHETERS) ×1
CATH URTH 16FR FL 2W BLN LF (CATHETERS) ×1 IMPLANT
CNTNR URN SCR LID CUP LEK RST (MISCELLANEOUS) IMPLANT
CONT SPEC 4OZ STRL OR WHT (MISCELLANEOUS) ×1
COVER TIP SHEARS 8 DVNC (MISCELLANEOUS) ×1 IMPLANT
COVER TIP SHEARS 8MM DA VINCI (MISCELLANEOUS) ×2
COVER WAND RF STERILE (DRAPES) ×1 IMPLANT
DEFOGGER SCOPE WARMER CLEARIFY (MISCELLANEOUS) IMPLANT
DERMABOND ADVANCED .7 DNX12 (GAUZE/BANDAGES/DRESSINGS) ×1 IMPLANT
DRAPE ARM DVNC X/XI (DISPOSABLE) ×3 IMPLANT
DRAPE COLUMN DVNC XI (DISPOSABLE) ×1 IMPLANT
DRAPE DA VINCI XI ARM (DISPOSABLE) ×4
DRAPE DA VINCI XI COLUMN (DISPOSABLE) ×1
DRAPE ROBOT W/ LEGGING 30X125 (DRAPES) ×1 IMPLANT
DRESSING SURGICEL FIBRLLR 1X2 (HEMOSTASIS) IMPLANT
DRSG SURGICEL FIBRILLAR 1X2 (HEMOSTASIS) ×1
DRSG TEGADERM 4X4.75 (GAUZE/BANDAGES/DRESSINGS) IMPLANT
ELECT REM PT RETURN 9FT ADLT (ELECTROSURGICAL) ×1
ELECTRODE REM PT RTRN 9FT ADLT (ELECTROSURGICAL) ×1 IMPLANT
GAUZE 4X4 16PLY ~~LOC~~+RFID DBL (SPONGE) ×2 IMPLANT
GLOVE BIO SURGEON STRL SZ 6.5 (GLOVE) ×6 IMPLANT
GLOVE BIOGEL PI IND STRL 6.5 (GLOVE) ×4 IMPLANT
GLOVE ORTHO TXT STRL SZ7.5 (GLOVE) ×2 IMPLANT
GOWN STRL REUS W/ TWL LRG LVL3 (GOWN DISPOSABLE) ×8 IMPLANT
GOWN STRL REUS W/ TWL XL LVL3 (GOWN DISPOSABLE) ×2 IMPLANT
GOWN STRL REUS W/TWL LRG LVL3 (GOWN DISPOSABLE) ×8
GOWN STRL REUS W/TWL XL LVL3 (GOWN DISPOSABLE) ×3
HANDLE YANKAUER SUCT BULB TIP (MISCELLANEOUS) IMPLANT
IRRIGATION STRYKERFLOW (MISCELLANEOUS) IMPLANT
IRRIGATOR STRYKERFLOW (MISCELLANEOUS) ×1
IV NS IRRIG 3000ML ARTHROMATIC (IV SOLUTION) IMPLANT
KIT PINK PAD W/HEAD ARE REST (MISCELLANEOUS) ×1
KIT PINK PAD W/HEAD ARM REST (MISCELLANEOUS) ×1 IMPLANT
LABEL OR SOLS (LABEL) ×1 IMPLANT
MANIFOLD NEPTUNE II (INSTRUMENTS) ×1 IMPLANT
MANIPULATOR VCARE STD CRV RETR (MISCELLANEOUS) IMPLANT
NDL INSUFF ACCESS 14 VERSASTEP (NEEDLE) IMPLANT
NEEDLE HYPO 22GX1.5 SAFETY (NEEDLE) ×1 IMPLANT
NS IRRIG 1000ML POUR BTL (IV SOLUTION) ×1 IMPLANT
OBTURATOR OPTICAL STANDARD 8MM (TROCAR) ×1
OBTURATOR OPTICAL STND 8 DVNC (TROCAR) ×1
OBTURATOR OPTICALSTD 8 DVNC (TROCAR) IMPLANT
OCCLUDER COLPOPNEUMO (BALLOONS) ×1 IMPLANT
PACK GYN LAPAROSCOPIC (MISCELLANEOUS) ×1 IMPLANT
PAD PREP 24X41 OB/GYN DISP (PERSONAL CARE ITEMS) ×1 IMPLANT
PENCIL SMOKE EVACUATOR (MISCELLANEOUS) IMPLANT
SEAL CANN UNIV 5-8 DVNC XI (MISCELLANEOUS) ×3 IMPLANT
SEAL XI 5MM-8MM UNIVERSAL (MISCELLANEOUS) ×3
SEALER VESSEL DA VINCI XI (MISCELLANEOUS) ×1
SEALER VESSEL EXT DVNC XI (MISCELLANEOUS) IMPLANT
SET CYSTO W/LG BORE CLAMP LF (SET/KITS/TRAYS/PACK) IMPLANT
SET TUBE FILTERED XL AIRSEAL (SET/KITS/TRAYS/PACK) ×1 IMPLANT
SOL ELECTROSURG ANTI STICK (MISCELLANEOUS) ×1
SOL PREP PVP 2OZ (MISCELLANEOUS) ×1
SOLUTION ELECTROSURG ANTI STCK (MISCELLANEOUS) ×1 IMPLANT
SOLUTION PREP PVP 2OZ (MISCELLANEOUS) IMPLANT
SUT DVC VLOC 180 0 12IN GS21 (SUTURE) ×1
SUT ETHIBOND 0 MO6 C/R (SUTURE) IMPLANT
SUT MNCRL 4-0 (SUTURE) ×2
SUT MNCRL 4-0 27XMFL (SUTURE) ×2
SUT SILK 2 0 (SUTURE) ×1
SUT SILK 2-0 18XBRD TIE 12 (SUTURE) IMPLANT
SUT VIC AB 2-0 UR6 27 (SUTURE) IMPLANT
SUT VIC AB 3-0 SH 27 (SUTURE) ×1
SUT VIC AB 3-0 SH 27X BRD (SUTURE) IMPLANT
SUTURE DVC VLC 180 0 12IN GS21 (SUTURE) IMPLANT
SUTURE MNCRL 4-0 27XMF (SUTURE) ×1 IMPLANT
SYR 10ML LL (SYRINGE) ×1 IMPLANT
SYR 50ML LL SCALE MARK (SYRINGE) ×1 IMPLANT
SYS BAG RETRIEVAL 10MM (BASKET) ×2
SYS TROCAR 1.5-3 SLV ABD GEL (ENDOMECHANICALS) ×1
SYSTEM BAG RETRIEVAL 10MM (BASKET) IMPLANT
SYSTEM TROCR 1.5-3 SLV ABD GEL (ENDOMECHANICALS) IMPLANT
TRAP SPECIMEN MUCUS 40CC (MISCELLANEOUS) IMPLANT
WATER STERILE IRR 500ML POUR (IV SOLUTION) ×1 IMPLANT

## 2022-08-04 NOTE — Op Note (Signed)
Repair of recurrent 3 cm incarcerated ventral hernia.  Pre-operative Diagnosis: Incarcerated recurrent umbilical hernia.  Post-operative Diagnosis: same.    Surgeon: [name], M.D., FACS  Anesthesia: GETA   Findings: 3 cm fascial defect without evidence of mesh present.  Incarcerated primarily with omentum.  Estimated Blood Loss: Please see Dr. Gershon Crane notes.  No additional EBL.         Specimens: Partial omentectomy completed with Dr. Theora Gianotti.            Complications: none              Procedure Details  The patient was seen again in the Holding Room. The benefits, complications, treatment options, and expected outcomes were discussed with the patient. The risks of bleeding, infection, recurrence of symptoms, failure to resolve symptoms, unanticipated injury, prosthetic placement, prosthetic infection, any of which could require further surgery were reviewed with the patient. The likelihood of improving the patient's symptoms with return to their baseline status is anticipated.  The patient and/or family concurred with the proposed plan, giving informed consent.  The patient was taken to Operating Room, identified and the procedure verified.    Prior to the induction of general anesthesia, antibiotic prophylaxis was administered. VTE prophylaxis was in place.  General anesthesia was then administered and tolerated well. After the induction, the patient was positioned in the lithotomy position and the abdomen was prepped with  Chloraprep and draped in the sterile fashion.  A Time Out was held and the above information confirmed.  Initially I assisted Dr. Theora Gianotti with dissection of the hernia sac, its opening and partial omentectomy completed to reduce the bulk of the omentum.  Hemostasis maintained with suture ligatures of Vicryl.  Residual omentum was then replaced into the abdomen, and a gel point sleeve device was placed into this 3 cm fascial defect to enable continued  laparoscopic procedure.  I returned to the robotic console to also visually evaluate the exterior of the rectum at Dr. Gershon Crane request.  And she did a fine job with her dissection and I could not identify or appreciate any significant external rectal injury.  I assisted also with placing a rigid sigmoidoscope within the distal rectum for insufflation to confirm the absence of any distal rectal injury.  There was none.  I did not attempt visual evaluation of the internal aspect of the rectum due to lack of prep.  Upon completion of the procedure, I returned to extract the lymph node specimens, ensured that they were appropriately labeled left and right.  I then proceeded with reapproximation of the fascia transversely utilizing 0 Ethibond and a vest over pants technique.  There was not excessive tension on the closure, 5 sutures were utilized.  We then confirmed adequate hemostasis removed all the other ports after ensuring there was no entrapment of any intraperitoneal tissues and the hernia repair.  We then closed the port sites with interrupted subcuticular's of 4-0 Monocryl.  I closed the midline incision with a running 4-0 Monocryl subcuticular suture.  We then sealed all incisions with Dermabond.      [name] M.D., [facility] 08/04/2022 4:30 PM

## 2022-08-04 NOTE — Interval H&P Note (Signed)
History and Physical Interval Note:  08/04/2022 7:18 AM  Margaret Mercado  has presented today for surgery, with the diagnosis of Adnexal mass, ventral hernia.  The various methods of treatment have been discussed with the patient and family. After consideration of risks, benefits and other options for treatment, the patient has consented to  Procedure(s): XI ROBOTIC ASSISTED TOTAL HYSTERECTOMY WITH BILATERAL SALPINGO OOPHORECTOMY (N/A) XI ROBOTIC ASSISTED VENTRAL HERNIA (N/A) as a surgical intervention.  The patient's history has been reviewed, patient examined, no change in status, stable for surgery.  I have reviewed the patient's chart and labs.  Questions were answered to the patient's satisfaction.    Lab Results  Component Value Date   WBC 6.4 07/14/2022   HGB 12.9 07/14/2022   HCT 38.3 07/14/2022   MCV 94.3 07/14/2022   PLT 219 07/14/2022   Lab Results  Component Value Date   CREATININE 0.65 08/02/2022   BUN 18 08/02/2022   NA 140 08/02/2022   K 4.1 08/02/2022   CL 106 08/02/2022   CO2 27 08/02/2022      Justyna Timoney [employer]

## 2022-08-04 NOTE — Transfer of Care (Signed)
Immediate Anesthesia Transfer of Care Note  Patient: Margaret Mercado  Procedure(s) Performed: XI ROBOTIC ASSISTED TOTAL HYSTERECTOMY WITH BILATERAL SALPINGO OOPHORECTOMY CYSTOSCOPY HERNIA REPAIR VENTRAL ADULT  Patient Location: PACU  Anesthesia Type:General  Level of Consciousness: awake, alert , and oriented  Airway & Oxygen Therapy: Patient Spontanous Breathing and Patient connected to face mask oxygen  Post-op Assessment: Report given to RN, Post -op Vital signs reviewed and stable, and Patient moving all extremities  Post vital signs: Reviewed and stable  Last Vitals:  Vitals Value Taken Time  BP 133/77 08/04/22 1435  Temp    Pulse 89 08/04/22 1439  Resp 9 08/04/22 1439  SpO2 100 % 08/04/22 1439  Vitals shown include unvalidated device data.  Last Pain:  Vitals:   08/04/22 0639  TempSrc: Oral         Complications: No notable events documented.

## 2022-08-04 NOTE — Interval H&P Note (Signed)
History and Physical Interval Note:  08/04/2022 7:20 AM  Margaret Mercado  has presented today for surgery, with the diagnosis of Adnexal mass, ventral hernia.  The various methods of treatment have been discussed with the patient and family. After consideration of risks, benefits and other options for treatment, the patient has consented to  Procedure(s): XI ROBOTIC ASSISTED TOTAL HYSTERECTOMY WITH BILATERAL SALPINGO OOPHORECTOMY (N/A) XI ROBOTIC ASSISTED VENTRAL HERNIA (N/A) as a surgical intervention.  The patient's history has been reviewed, patient examined, no change in status, stable for surgery.  I have reviewed the patient's chart and labs.  Questions were answered to the patient's satisfaction.     [name]

## 2022-08-04 NOTE — Op Note (Signed)
Operative Note   Date 08/04/2022 TIME 1:55 PM  PRE-OP DIAGNOSIS: Symptomatic large right ovarian cystic mass with elevated CA125. Symptomatic ventral hernia.   POST-OP DIAGNOSIS: Benign right ovarian cystic mass with elevated CA125; Severe pelvic adhesive disease; Retroperitoneal  fibrosis;  Grade 1 endometrioid endometrial cancer with at least 50% invasion pending final pathology  SURGEON: Surgeon(s) and Role: Panel 1:  Nikyla Navedo Gaetana Michaelis, MD  ASSISTANT:  Benjaman Kindler, MD  ANESTHESIA: General  PROCEDURE: Procedure(s): Exam under anesthesia, Robotic assisted total hysterectomy and bilateral salpingo-oophorectomy with extensive adhesiolysis including enterolysis and ovariolysis, right ureterolysis, and bilateral pelvic node dissection/   Dr. Benjaman Kindler performed cystoscopy.   Dr. Christian Mate performed  umbilical hernia repair and proctoscopy.   ESTIMATED BLOOD LOSS: <50 cc  DRAINS: Foley  TOTAL IV FLUIDS: as per anesthesia note  UOP: as per anesthesia note  SPECIMENS:  Uterus, and bilateral fallopian tubes and ovaries, bilateral obturator and iliac pelvic nodes, and washings.   COMPLICATIONS: None  DISPOSITION: PACU  CONDITION: Stable  INDICATIONS: Symptomatic large right ovarian cystic mass with elevated CA125 and symptomatic ventral hernia.    FINDINGS: Exam under anesthesia revealed a large pelvic mass with cul-de-sac involvement with limited mobility. I could not palpate the cervix given the large mass in the cul-de-sac. Unable to determine uterine size. Intraoperative findings included: The supraumbilical hernia was noted with a fascial defect of 2 cm and ~4 cm omentum. The uterus was normal size and shape; densely adherent to the rectosigmoid and right ovary. The adnexa on the left was normal other the adhesions to the bowel and left side wall. The right ovary is 19 cm and adherent to the right sidewall, pelvic floor, right ureter, rectosigmoid, lateral  uterus, and bladder.  The upper abdomen was normal including omentum, bowel, liver, stomach, and diaphragmatic surfaces. There was no evidence of grossly enlarged pelvic or right para-aortic lymph nodes.   PROCEDURE IN DETAIL: After informed consent was obtained, the patient was taken to the operating room where anesthesia was obtained without difficulty. The patient was positioned in the dorsal lithotomy position in [place] and her arms were carefully tucked at her sides and the usual precautions were taken.  She was prepped and draped in normal sterile fashion.  Time-out was performed.  A Foley catheter was placed using sterile technique. The mass was so large a V--Care manipulator could not be placed.   Operative entry was obtained via a supraumbilical incision and direct entry. The Hasson port was placed, abdomen insuffulated, and pelvis visualized with noted findings above. The Vcare was placed at this point. The robotic ports and LUQ port placement, and robotic docking was performed.   Operative entry was obtained via a supraumbilical incision overlying the hernia. The incision was carried down the various layers until the hernia sac was entered.  Partial omentectomy was performed in order to reduce the amount of herniated omentum.  The residual omentum was then reduced into the intraperitoneal cavity.  A GelPort was placed and the robotic trocar and camera was placed through the supraumbilical incision. The abdomen was insuffulated, and pelvis and abdomen visualized with noted findings above. The omental pedicles appears hemostatic.  The robotic ports and LUQ port placement, and robotic docking was performed. The patient was placed in Trendelenburg and the bowel was displaced up into the upper abdomen. Washings were obtained.   The right retroperitoneal space was opened and the round ligament divided.  The infundibulopelvic ligament was skeletonized, and the ureter was  identified and  preserved. The right infundibular ligament was sealed and divided.  The remaining lateral attachments were dissected.  At this point the right ovarian cyst was drained and copious amounts of fluid was removed.  Once this was performed the speculum was placed in the vagina and the cervical os was dilator.  A medium  V-Care uterine manipulator was then placed in the uterus without incident.  Adhesiolysis dissecting the adhesions between the sidewall and bowel and bladder was performed in excess of 2 hours.  Adhesiolysis included intra lysis and ovariolysis.  In addition right retroperitoneal fibrosis was present and right. ureterolysis was performed.  The mass was adherent to the ureter for almost the entire course through the pelvis.  Attention was turned to the left side where the retroperitoneal space was opened and the round ligament divided.  The infundibulopelvic ligament was skeletonized, and the ureter was identified and preserved. The left infundibular ligament was sealed and divided.  A bladder flap was created and the bladder was dissected down off the lower uterine segment and cervix.  Next attention was turned to the rectosigmoid which was densely adherent to the mass and to the uterus.  With careful dissection and Hydro dissection the rectosigmoid was dissected off of the adjacent structures.   The uterus was manipulated to allow exposure of the uterine arteries. The uterine arteries were skeletonized bilaterally, sealed and divided with cautery and transected.  A colpotomy was performed circumferentially along the V-Care ring with electrocautery and the cervix was incised from the vagina. The V-care and specimen was removed.  A pneumo balloon was placed in the vagina and ring forceps used to removed both sentinel lymph nodes. The vaginal cuff was then closed in a running continuous fashion using 0 V-Lock suture with careful attention to include the vaginal cuff angles and the vaginal mucosa within the  closure.    Cystoscopy was performed by Dr. Leafy Ro and revealed flow from bilateral ureteral orifices. At the end of procedure both ureters were evaluated and noted to be of normal caliber.   Proctoscopy was performed by Dr. Leafy Ro and revealed a negative bubble test.   Frozen section from pathology revealed a grade 1 endometrioid endometrial cancer with at least 50% invasion.  Therefore pelvic lymph node dissection was performed bilaterally. The retroperitoneal space o had already been completely opened.  This was performed on the left.  Bilateral pelvic nodes from the bifurcation to the circumflex and included the obturator nodes. The obturator nerve was visualized and care was taken to avoid injury to any adjacent organs. The nodes were removed from the peritoneal cavity. Fibrillar was placed in the nodal basins.    Hemostasis was observed. The intraperitoneal pressure was dropped, and all planes of dissection, vascular pedicles and the vaginal cuff were found to be hemostatic. The case was then turned over to Dr. Christian Mate for hernia repair.   Dr. Leafy Ro performed closed the skin incisions.  The patient tolerated the procedure well.  Sponge, lap and needle counts were correct x2.  The patient was taken to recovery room in excellent condition.  Antibiotics: Given 1st or 2nd generation cephalosporin and Flagyl, Antibiotics given within 1 hour of the start of the procedure, Antibiotics ordered to be discontinued within 24 hours post procedure   VTE prophylaxis: was ordered perioperatively.  Dr. Leafy Ro assisted me with the procedure which could not have been performed without her assistance. This was a high-level case requiring a Insurance risk surveyor. Dr. Leafy Ro performed the robotic  port and assistant port placement, robotic port, docking, and insertion of robotic instruments. She also provided high level assistance throughout the procedure that was needed for resection of metastatic disease and  uterine manipulation. She performed the cystoscopy and noted the above findings.    Ociel Retherford ALVAREZ Josiel Gahm,MD

## 2022-08-04 NOTE — Progress Notes (Signed)
To patient's beside for pain control review. Allergies to oxycodone with anaphylaxis in chart. Pt denies any reaction to pain medication. Will start with fentanyl immediately, then dilaudid IV and transition to PO when tolerating po.

## 2022-08-04 NOTE — Anesthesia Procedure Notes (Signed)
Procedure Name: Intubation Date/Time: 08/04/2022 7:45 AM  Performed by: Esaw Grandchild, CRNAPre-anesthesia Checklist: Patient identified, Emergency Drugs available, Suction available and Patient being monitored Patient Re-evaluated:Patient Re-evaluated prior to induction Oxygen Delivery Method: Circle system utilized Preoxygenation: Pre-oxygenation with 100% oxygen Induction Type: IV induction Ventilation: Mask ventilation without difficulty and Oral airway inserted - appropriate to patient size Laryngoscope Size: Sabra Heck and 2 Grade View: Grade I Tube type: Oral Tube size: 7.0 mm Number of attempts: 1 Airway Equipment and Method: Stylet, Oral airway and Bite block Placement Confirmation: ETT inserted through vocal cords under direct vision, positive ETCO2 and breath sounds checked- equal and bilateral Secured at: 20 cm Tube secured with: Tape Dental Injury: Teeth and Oropharynx as per pre-operative assessment

## 2022-08-04 NOTE — Anesthesia Preprocedure Evaluation (Addendum)
Anesthesia Evaluation  Patient identified by MRN, date of birth, ID band Patient awake    Reviewed: Allergy & Precautions, NPO status , Patient's Chart, lab work & pertinent test results  History of Anesthesia Complications Negative for: history of anesthetic complications  Airway Mallampati: II   Neck ROM: Full    Dental  (+) Missing   Pulmonary neg pulmonary ROS   Pulmonary exam normal breath sounds clear to auscultation       Cardiovascular hypertension, Normal cardiovascular exam Rhythm:Regular Rate:Normal  ECG 07/15/22: SB (HR 58) with no acute ischemic changes   Neuro/Psych  PSYCHIATRIC DISORDERS Anxiety     TIA   GI/Hepatic negative GI ROS,,,  Endo/Other  Hypothyroidism  Obesity   Renal/GU negative Renal ROS     Musculoskeletal   Abdominal   Peds  Hematology negative hematology ROS (+)   Anesthesia Other Findings   Reproductive/Obstetrics                             Anesthesia Physical Anesthesia Plan  ASA: 2  Anesthesia Plan: General   Post-op Pain Management:    Induction: Intravenous  PONV Risk Score and Plan: 3 and Ondansetron, Dexamethasone and Treatment may vary due to age or medical condition  Airway Management Planned: Oral ETT  Additional Equipment:   Intra-op Plan:   Post-operative Plan: Extubation in OR  Informed Consent: I have reviewed the patients History and Physical, chart, labs and discussed the procedure including the risks, benefits and alternatives for the proposed anesthesia with the patient or authorized representative who has indicated his/her understanding and acceptance.     Dental advisory given  Plan Discussed with: CRNA  Anesthesia Plan Comments: (Patient consented for risks of anesthesia including but not limited to:  - adverse reactions to medications - damage to eyes, teeth, lips or other oral mucosa - nerve damage due to  positioning  - sore throat or hoarseness - damage to heart, brain, nerves, lungs, other parts of body or loss of life  Informed patient about role of CRNA in peri- and intra-operative care.  Patient voiced understanding.)        Anesthesia Quick Evaluation

## 2022-08-05 ENCOUNTER — Other Ambulatory Visit: Payer: Self-pay

## 2022-08-05 ENCOUNTER — Encounter: Payer: Self-pay | Admitting: Obstetrics and Gynecology

## 2022-08-05 DIAGNOSIS — C541 Malignant neoplasm of endometrium: Secondary | ICD-10-CM | POA: Diagnosis not present

## 2022-08-05 MED ORDER — ACETAMINOPHEN 500 MG PO TABS
1000.0000 mg | ORAL_TABLET | Freq: Four times a day (QID) | ORAL | Status: DC | PRN
  Administered 2022-08-05: 1000 mg via ORAL
  Filled 2022-08-05: qty 2

## 2022-08-05 MED ORDER — IBUPROFEN 800 MG PO TABS
800.0000 mg | ORAL_TABLET | Freq: Three times a day (TID) | ORAL | 1 refills | Status: AC
  Filled 2022-08-05: qty 30, 10d supply, fill #0

## 2022-08-05 MED ORDER — ACETAMINOPHEN EXTRA STRENGTH 500 MG PO TABS
1000.0000 mg | ORAL_TABLET | Freq: Four times a day (QID) | ORAL | 0 refills | Status: AC
  Filled 2022-08-05: qty 24, 3d supply, fill #0

## 2022-08-05 MED ORDER — HYDROMORPHONE HCL 2 MG PO TABS
2.0000 mg | ORAL_TABLET | Freq: Four times a day (QID) | ORAL | Status: DC | PRN
  Administered 2022-08-05: 2 mg via ORAL
  Filled 2022-08-05 (×3): qty 1

## 2022-08-05 MED ORDER — GABAPENTIN 300 MG PO CAPS
300.0000 mg | ORAL_CAPSULE | Freq: Every day | ORAL | 0 refills | Status: DC
  Filled 2022-08-05: qty 14, 14d supply, fill #0

## 2022-08-05 MED ORDER — HYDROMORPHONE HCL 2 MG PO TABS
1.0000 mg | ORAL_TABLET | Freq: Four times a day (QID) | ORAL | 0 refills | Status: DC | PRN
  Filled 2022-08-05: qty 30, 15d supply, fill #0

## 2022-08-05 MED ORDER — DOCUSATE SODIUM 100 MG PO CAPS
100.0000 mg | ORAL_CAPSULE | Freq: Two times a day (BID) | ORAL | 0 refills | Status: DC
  Filled 2022-08-05: qty 30, 15d supply, fill #0
  Filled 2022-10-27: qty 100, 50d supply, fill #0
  Filled 2022-12-14: qty 30, 15d supply, fill #0

## 2022-08-05 NOTE — Discharge Instructions (Addendum)
In addition to included general post-operative instructions,  Diet: Resume home diet.   Activity: No heavy lifting >20 pounds (children, pets, laundry, garbage) or strenuous activity for 6 weeks, but light activity and walking are encouraged. Do not drive or drink alcohol if taking narcotic pain medications or having pain that might distract from driving. Continue abdominal binder at home.   Wound care: 2 days after surgery (02/23), you may shower/get incision wet with soapy water and pat dry (do not rub incisions), but no baths or submerging incision underwater until follow-up.   Call office [phone] - General Surgery, Dr Christian Mate) at any time if any questions, worsening pain, fevers/chills, bleeding, drainage from incision site, or other concerns.

## 2022-08-05 NOTE — Anesthesia Postprocedure Evaluation (Signed)
Anesthesia Post Note  Patient: Margaret Mercado  Procedure(s) Performed: XI ROBOTIC ASSISTED TOTAL HYSTERECTOMY WITH BILATERAL SALPINGO OOPHORECTOMY CYSTOSCOPY HERNIA REPAIR VENTRAL ADULT  Patient location during evaluation: PACU Anesthesia Type: General Level of consciousness: awake and alert, oriented and patient cooperative Pain management: pain level controlled Vital Signs Assessment: post-procedure vital signs reviewed and stable Respiratory status: spontaneous breathing, nonlabored ventilation and respiratory function stable Cardiovascular status: blood pressure returned to baseline and stable Postop Assessment: adequate PO intake Anesthetic complications: no   No notable events documented.   Last Vitals:  Vitals:   08/05/22 0021 08/05/22 0330  BP: (!) 103/50 (!) 100/49  Pulse: 88 77  Resp:  18  Temp:  37 C  SpO2:  93%    Last Pain:  Vitals:   08/05/22 0330  TempSrc: Oral  PainSc:                  Darrin Nipper

## 2022-08-05 NOTE — Progress Notes (Signed)
[facility] Day(s): 0.   Post op day(s): 1 Day Post-Op.   Interval History:  Patient seen and examined No acute events or new complaints overnight.  Patient reports she is doing well; abdominal soreness but pain is reasonably controlled No fever, chills, nausea, emesis No new labs this morning She is on regular diet but has decreased appetite Wearing abdominal binder; enjoys this  Vital signs in last 24 hours: [min-max] current  Temp:  [97.6 F (36.4 C)-98.7 F (37.1 C)] 98.3 F (36.8 C) (02/22 0845) Pulse Rate:  [77-94] 82 (02/22 0845) Resp:  [10-20] 18 (02/22 0845) BP: (97-153)/(46-88) 105/50 (02/22 0845) SpO2:  [93 %-100 %] 94 % (02/22 0845)     Height: 5' 1"$  (154.9 cm) Weight: 88.5 kg BMI (Calculated): 36.86   Intake/Output last 2 shifts:  02/21 0701 - 02/22 0700 In: 3840.8 [P.O.:480; I.V.:3035.8; IV Piggyback:325] Out: [id number] [Urine:1700; Blood:40]   Physical Exam:  Constitutional: alert, cooperative and no distress  Respiratory: breathing non-labored at rest  Cardiovascular: regular rate and sinus rhythm  Gastrointestinal: soft, incisional soreness, non-distended, no rebound/guarding. Abdominal binder present  Integumentary: Laparoscopic incisions are CDI with dermabond, no erythema or drainage   Labs:     Latest Ref Rng & Units 08/04/2022    3:28 PM 07/14/2022    4:07 PM 09/21/2021    2:12 PM  CBC  WBC 4.0 - 10.5 K/uL 10.8  6.4  6.5   Hemoglobin 12.0 - 15.0 g/dL 12.4  12.9  13.5   Hematocrit 36.0 - 46.0 % 37.2  38.3  41.1   Platelets 150 - 400 K/uL 221  219  260       Latest Ref Rng & Units 08/04/2022    3:28 PM 08/02/2022    2:38 PM 06/18/2022    7:38 AM  CMP  Glucose 70 - 99 mg/dL 188  113  127   BUN 8 - 23 mg/dL 13  18  15   $ Creatinine 0.44 - 1.00 mg/dL 0.75  0.65  0.71   Sodium 135 - 145 mmol/L 134  140  140   Potassium 3.5 - 5.1 mmol/L 3.7  4.1  4.0   Chloride 98 - 111 mmol/L 103  106  104   CO2 22 - 32  mmol/L 24  27  26   $ Calcium 8.9 - 10.3 mg/dL 8.4  8.9  9.4   Total Protein 6.0 - 8.3 g/dL   6.4   Total Bilirubin 0.2 - 1.2 mg/dL   0.4   Alkaline Phos 39 - 117 U/L   61   AST 0 - 37 U/L   13   ALT 0 - 35 U/L   12     Imaging studies: No new pertinent imaging studies   Assessment/Plan: 67 y.o. female 1 Day Post-Op s/p robotic assisted laparoscopic ventral hernia repair with Dr Christian Mate as well as EUA and robotic assisted laparoscopic total hysterectomy and bilateral salpingo-oophorectomy with extensive adhesiolysis including enterolysis and ovariolysis, right ureterolysis, and bilateral pelvic node dissection with Dr Secord/Dr Leafy Ro.    - Okay to continue diet as tolerated - Continue abdominal binder; reviewed usage at home - Monitor abdominal examination - Pain control prn; antiemetics prn - mobilize as tolerated; reviewed activity restrictions   - Further management per primary service      - Discharge Planning: Okay for discharge from surgical perspective, reviewed DC instructions and follow up.    All of the above findings and  recommendations were discussed with the patient, patient's family (husband at bedside), and the medical team, and all of their questions were answered to their expressed satisfaction.  -- Edison Simon, PA-C [facility] 08/05/2022, 9:17 AM M-F: 7am - 4pm

## 2022-08-05 NOTE — Progress Notes (Signed)
Patient discharged. Discharge instructions given. Patient verbalizes understanding. Transported by axillary.

## 2022-08-05 NOTE — Discharge Summary (Signed)
1 Day Post-Op       Procedure(s): XI ROBOTIC ASSISTED TOTAL HYSTERECTOMY WITH BILATERAL SALPINGO OOPHORECTOMY (N/A) HERNIA REPAIR VENTRAL ADULT (N/A) CYSTOSCOPY (N/A) Subjective: The patient is doing well.  No nausea or vomiting. Pain is adequately controlled.  Objective: Vital signs in last 24 hours: Temp:  [97.6 F (36.4 C)-98.7 F (37.1 C)] 98.3 F (36.8 C) (02/22 0845) Pulse Rate:  [77-94] 82 (02/22 0845) Resp:  [10-20] 18 (02/22 0845) BP: (97-153)/(46-88) 105/50 (02/22 0845) SpO2:  [93 %-100 %] 94 % (02/22 0845)  Intake/Output  Intake/Output Summary (Last 24 hours) at 08/05/2022 1054 Last data filed at 08/05/2022 0845 Gross per 24 hour  Intake 3040.79 ml  Output 2040 ml  Net 1000.79 ml    Physical Exam:  General: Alert and oriented. CV: RRR Lungs: Clear bilaterally. GI: Soft, Nondistended. Incisions: Clean and dry. Urine: Clear, Foley in place Extremities: Nontender, no erythema, no edema.  Lab Results: Recent Labs    08/04/22 1528  HGB 12.4  HCT 37.2  WBC 10.8*  PLT 221                 Results for orders placed or performed during the hospital encounter of 08/04/22 (from the past 24 hour(s))  CBC     Status: Abnormal   Collection Time: 08/04/22  3:28 PM  Result Value Ref Range   WBC 10.8 (H) 4.0 - 10.5 K/uL   RBC 3.91 3.87 - 5.11 MIL/uL   Hemoglobin 12.4 12.0 - 15.0 g/dL   HCT 37.2 36.0 - 46.0 %   MCV 95.1 80.0 - 100.0 fL   MCH 31.7 26.0 - 34.0 pg   MCHC 33.3 30.0 - 36.0 g/dL   RDW 12.9 11.5 - 15.5 %   Platelets 221 150 - 400 K/uL   nRBC 0.0 0.0 - 0.2 %  Basic metabolic panel     Status: Abnormal   Collection Time: 08/04/22  3:28 PM  Result Value Ref Range   Sodium 134 (L) 135 - 145 mmol/L   Potassium 3.7 3.5 - 5.1 mmol/L   Chloride 103 98 - 111 mmol/L   CO2 24 22 - 32 mmol/L   Glucose, Bld 188 (H) 70 - 99 mg/dL   BUN 13 8 - 23 mg/dL   Creatinine, Ser 0.75 0.44 - 1.00 mg/dL   Calcium 8.4 (L) 8.9 - 10.3 mg/dL   GFR, Estimated >60 >60 mL/min    Anion gap 7 5 - 15    Assessment/Plan: 1 Day Post-Op       Procedure(s): XI ROBOTIC ASSISTED TOTAL HYSTERECTOMY WITH BILATERAL SALPINGO OOPHORECTOMY (N/A) HERNIA REPAIR VENTRAL ADULT (N/A) CYSTOSCOPY (N/A)  -Ambulate, Incentive spirometry -Advance diet as tolerated -Transition to oral pain medication -Discharge home today anticipated    Benjaman Kindler, MD   LOS: 0 days   Benjaman Kindler 08/05/2022, 10:54 AM

## 2022-08-09 ENCOUNTER — Telehealth: Payer: Self-pay

## 2022-08-09 LAB — CYTOLOGY - NON PAP

## 2022-08-09 NOTE — Transitions of Care (Post Inpatient/ED Visit) (Signed)
   08/09/2022  Name: QUINNLAN MOUSSA MRN: [id number] DOB: 06-Feb-1956  Today's TOC FU Call Status: Today's TOC FU Call Status:: Successful TOC FU Call Competed TOC FU Call Complete Date: 08/09/22  Transition Care Management Follow-up Telephone Call Date of Discharge: 08/05/22 Discharge Facility: [facility]) Type of Discharge: Inpatient Admission Primary Inpatient Discharge Diagnosis:: hernia How have you been since you were released from the hospital?: Better Any questions or concerns?: No  Items Reviewed: Did you receive and understand the discharge instructions provided?: Yes Medications obtained and verified?: Yes (Medications Reviewed) Any new allergies since your discharge?: No Dietary orders reviewed?: NA Do you have support at home?: Yes People in Home: spouse  Home Care and Equipment/Supplies: [place] Ordered?: NA Any new equipment or medical supplies ordered?: NA  Functional Questionnaire: Do you need assistance with bathing/showering or dressing?: No Do you need assistance with meal preparation?: No Do you need assistance with eating?: No Do you have difficulty maintaining continence: No Do you need assistance with getting out of bed/getting out of a chair/moving?: No Do you have difficulty managing or taking your medications?: No  Folllow up appointments reviewed: PCP Follow-up appointment confirmed?: NA Specialist Hospital Follow-up appointment confirmed?: Yes Date of Specialist follow-up appointment?: 08/19/22 Follow-Up Specialty Provider:: Dr Christian Mate Do you need transportation to your follow-up appointment?: No Do you understand care options if your condition(s) worsen?: Yes-patient verbalized understanding    [place], Annetta Nurse Health Advisor Direct Dial [phone]

## 2022-08-10 ENCOUNTER — Telehealth: Payer: Self-pay | Admitting: *Deleted

## 2022-08-10 ENCOUNTER — Telehealth: Payer: Self-pay

## 2022-08-10 NOTE — Telephone Encounter (Signed)
Received call from Ms. Logiudice. She is doing well after surgery. She has seen the results of cytology and had further questions regarding pathology. Final surgical pathology is still pending. Dr. Theora Gianotti will review once it is available and we will call her with recommendations.

## 2022-08-10 NOTE — Telephone Encounter (Signed)
Patient called stating that she sees it looks like her cancer has spread and would like to speak with someone about this. She is asking if she needs to call Dr Leafy Ro or if we will discuss with her. Her next appointment is not until the end of March .  Cytology - Non PAP; Order: [id number] Status: Edited Result - FINAL     Visible to patient: Yes (seen)     Next appt: 08/19/2022 at 01:30 PM in General Surgery [name], MD)   0 Result Notes    Component 6 d ago  CYTOLOGY - NON GYN CYTOLOGY - NON PAP CASE: [id number] PATIENT: Margaret Mercado Non-Gynecological Cytology Report     Specimen Submitted: A. Pelvic washings  Clinical History: Adnexal mass, ventral hernia    DIAGNOSIS: A. PELVIC WASHINGS; LIQUID-BASED PREPARATION: - MALIGNANT CELLS PRESENT. - COMPATIBLE WITH METASTATIC ADENOCARCINOMA OF GYNECOLOGIC ORIGIN (SEE [phone]).  Comment: Immunohistochemical studies demonstrate numerous clusters of tumor cells which are positive for Pax-8, and negative for calretinin. Calretinin does show marking in background mesothelial cells. These findings support the above diagnosis.  Slides reviewed: 1 ThinPrep, 1 cell block, 2 cytospin  IHC slides were prepared by [facility] for Molecular Biology and Pathology, [place], [place]. All controls stained appropriately.  This test was developed and its performance characteristics determined by [facility]. It has not been cleared or approved by the [place] Food and Drug Administration. The FDA does not require this test to go through premarket FDA review. This test is used for clinical purposes. It should not be regarded as investigational or for research. This laboratory is certified under the Clinical Laboratory Improvement Amendments (CLIA) as qualified to perform high complexity clinical laboratory testing.  GROSS DESCRIPTION: A. Labeled: Pelvic washings Received: Fresh Collection time: 8:28 AM on 08/04/2022 Placed into  formalin time: Not applicable Volume: Approximately 30 mL Description of fluid and container in which it is received: Received in a clear specimen container with a white screw top lid is pink-red, cloudy fluid Cytospin slide(s) received: Yes, 2  Specimen material submitted for: Cell block and ThinPrep  The cell block material is fixed in formalin for 6 hours prior to processing.  CM 08/05/2022  Final Diagnosis performed by Allena Napoleon, MD.   Electronically signed 08/09/2022 2:40:37PM The electronic signature indicates that the named Attending Pathologist has evaluated the specimen Technical component performed at [facility], [address], [place], [place] [address] Lab: [phone] Dir: Rush Farmer, MD, MMM  Professional component performed at [facility], [facility], [place], [place], [place] [address] Lab: [id number] Dir: Kathi Simpers, MD  Resulting Agency [facility]         Specimen Collected: 08/04/22 08:28 Last Resulted: 08/09/22 14:57

## 2022-08-10 NOTE — Telephone Encounter (Signed)
Final pathology has resulted. Sent to Dr. Theora Gianotti for review.

## 2022-08-12 ENCOUNTER — Telehealth: Payer: Self-pay

## 2022-08-12 NOTE — Telephone Encounter (Signed)
I called and spoke with patient who states that [employer] did not receive her form. I have refaxed the for along with the confirmation of previous fax dated 07/28/22

## 2022-08-12 NOTE — Telephone Encounter (Signed)
Received voicemail that LOA was denied for surgery. Will have support team for FMLA reach out to her.

## 2022-08-16 ENCOUNTER — Telehealth: Payer: Self-pay | Admitting: Nurse Practitioner

## 2022-08-16 ENCOUNTER — Other Ambulatory Visit: Payer: Self-pay

## 2022-08-16 DIAGNOSIS — C541 Malignant neoplasm of endometrium: Secondary | ICD-10-CM

## 2022-08-16 LAB — SURGICAL PATHOLOGY

## 2022-08-16 NOTE — Telephone Encounter (Signed)
Spoke to patient re: her pathology as well as likely recommendation for adjuvant treatment. She will see Dr Theora Gianotti on the 11th to discuss pathology and then see Dr Tasia Catchings following. All questions answered in detail.

## 2022-08-16 NOTE — Telephone Encounter (Signed)
Called to review pathology with patient. No answer. Left vm to return call. Will also schedule her to see Dr Theora Gianotti next week, on Monday, to review pathology in more detail as well as recommendations for adjuvant treatment.

## 2022-08-18 ENCOUNTER — Other Ambulatory Visit: Payer: Self-pay | Admitting: Family

## 2022-08-18 ENCOUNTER — Other Ambulatory Visit: Payer: Self-pay

## 2022-08-18 MED FILL — Sertraline HCl Tab 50 MG: ORAL | 30 days supply | Qty: 30 | Fill #2 | Status: AC

## 2022-08-19 ENCOUNTER — Ambulatory Visit (INDEPENDENT_AMBULATORY_CARE_PROVIDER_SITE_OTHER): Payer: 59 | Admitting: Surgery

## 2022-08-19 ENCOUNTER — Encounter: Payer: Self-pay | Admitting: Surgery

## 2022-08-19 ENCOUNTER — Encounter: Payer: Self-pay | Admitting: Internal Medicine

## 2022-08-19 VITALS — BP 128/77 | HR 83 | Ht 64.0 in | Wt 193.0 lb

## 2022-08-19 DIAGNOSIS — K430 Incisional hernia with obstruction, without gangrene: Secondary | ICD-10-CM | POA: Diagnosis not present

## 2022-08-19 DIAGNOSIS — K439 Ventral hernia without obstruction or gangrene: Secondary | ICD-10-CM

## 2022-08-19 NOTE — Patient Instructions (Signed)

## 2022-08-19 NOTE — Progress Notes (Signed)
[facility] POST-OP OFFICE VISIT  08/19/2022  HPI: Margaret Mercado is a 67 y.o. female 15 days s/p primary repair of anterior abdominal wall hernia defect approximate 3 cm.  I reviewed her path report from Dr. Gershon Crane portion of the procedure.  She denies having any pain, she is wearing abdominal support aside from her binder.  Vital signs: BP 128/77   Pulse 83   Ht '5\' 4"'$  (1.626 m)   Wt 193 lb (87.5 kg)   BMI 33.13 kg/m    Physical Exam: Constitutional: She looks well Abdomen: Soft nontender.  No peri-incisional masses. Skin: Incisions clean dry and intact.  Assessment/Plan: This is a 67 y.o. female 15 days s/p suture repair of anterior abdominal wall fascial defect approximately 3 cm.  No mesh was utilized.  Patient Active Problem List   Diagnosis Date Noted   Complex ovarian cyst 08/04/2022   Adnexal mass 07/18/2022   Rash 02/21/2022   Hemorrhoids 02/21/2022   Encounter for completion of form with patient 09/24/2021   Headache 09/23/2021   Expressive aphasia 09/13/2021   Breast cancer screening 02/08/2021   Hematoma 01/18/2021   Leg pain 01/18/2021   Type 2 diabetes mellitus with hyperglycemia ([place]) 05/27/2020   Tricuspid valve insufficiency 03/28/2019   Heart murmur 11/08/2018   History of TIA (transient ischemic attack) 11/08/2018   Abnormal mammogram of right breast 05/03/2018   DOE (dyspnea on exertion) 12/13/2017   Stress 12/13/2017   Hyperglycemia 12/12/2017   Healthcare maintenance 05/17/2016   Abdominal pain, left lower quadrant 08/19/2013   Ventral hernia 08/19/2013   Pyuria 08/19/2013   Sleep apnea 06/17/2012   FLUSHING 02/14/2009   Hyperlipidemia LDL goal <70 01/30/2009   Essential hypertension 01/30/2009   CARDIAC ARRHYTHMIA 01/30/2009   Transient cerebral ischemia 01/30/2009    -We reviewed her risk for recurrence, will be glad to see her again should there be any suspicion of this.  Otherwise follow-up as needed.   [name] M.D., FACS 08/19/2022, 1:40 PM

## 2022-08-20 ENCOUNTER — Other Ambulatory Visit: Payer: Self-pay

## 2022-08-20 ENCOUNTER — Encounter: Payer: Self-pay | Admitting: Internal Medicine

## 2022-08-20 MED ORDER — NEBIVOLOL HCL 2.5 MG PO TABS
1.2500 mg | ORAL_TABLET | Freq: Every day | ORAL | 1 refills | Status: DC
  Filled 2022-08-20: qty 45, 90d supply, fill #0
  Filled 2022-10-27: qty 45, 90d supply, fill #1

## 2022-08-23 ENCOUNTER — Encounter: Payer: Self-pay | Admitting: Oncology

## 2022-08-23 ENCOUNTER — Inpatient Hospital Stay: Payer: 59

## 2022-08-23 ENCOUNTER — Inpatient Hospital Stay: Payer: 59 | Attending: Obstetrics and Gynecology | Admitting: Obstetrics and Gynecology

## 2022-08-23 ENCOUNTER — Inpatient Hospital Stay (HOSPITAL_BASED_OUTPATIENT_CLINIC_OR_DEPARTMENT_OTHER): Payer: 59 | Admitting: Oncology

## 2022-08-23 VITALS — BP 158/74 | HR 61 | Temp 96.0°F | Resp 18 | Wt 190.0 lb

## 2022-08-23 VITALS — BP 158/74 | HR 61 | Temp 96.0°F | Resp 18 | Ht 64.0 in | Wt 190.0 lb

## 2022-08-23 DIAGNOSIS — Z90710 Acquired absence of both cervix and uterus: Secondary | ICD-10-CM

## 2022-08-23 DIAGNOSIS — Z170 Estrogen receptor positive status [ER+]: Secondary | ICD-10-CM

## 2022-08-23 DIAGNOSIS — Z79899 Other long term (current) drug therapy: Secondary | ICD-10-CM

## 2022-08-23 DIAGNOSIS — Z90722 Acquired absence of ovaries, bilateral: Secondary | ICD-10-CM

## 2022-08-23 DIAGNOSIS — Z7189 Other specified counseling: Secondary | ICD-10-CM | POA: Insufficient documentation

## 2022-08-23 DIAGNOSIS — C541 Malignant neoplasm of endometrium: Secondary | ICD-10-CM | POA: Diagnosis not present

## 2022-08-23 NOTE — Assessment & Plan Note (Signed)
Curative intent. Discussed with patient.  

## 2022-08-23 NOTE — Assessment & Plan Note (Addendum)
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  I discussed with Dr. Theora Gianotti.  We recommend adjuvant chemotherapy with 6 cycles of carboplatin and Taxol.  Adjuvant immunotherapy may be considered, not FDA approved yet. I explained to the patient the risks and benefits of chemotherapy including all but not limited to infusion reaction, hair loss,  mouth sore, nausea, vomiting, diarrhea low blood counts, neuropathy bleeding, kidney failure and risk of life threatening infection and even death, secondary malignancy etc.  Patient is uncertain decided today.  She is concerned about potential side effects affecting her other medical problems.  She would like to further discuss with her husband and update our office regarding her decisions.  # If patient agrees with adjuvant chemotherapy, we will set up chemotherapy education; Antiemetics-Zofran and Compazine; EMLA cream will be sent pharmacy

## 2022-08-23 NOTE — Progress Notes (Signed)
Gynecologic Oncology Interval Visit   Referring Provider: Dr. Leafy Ro  Chief Complaint: endometrial cancer  Subjective:  Margaret Mercado is a 67 y.o. female who is seen in consultation from Dr. Leafy Ro for adnexal mass and elevated ca 125.   She presents today to discuss her pathology results.   DIAGNOSIS: A. UTERUS WITH CERVIX, BILATERAL FALLOPIAN TUBES AND OVARIES; TOTAL HYSTERECTOMY WITH BILATERAL SALPINGO-OOPHORECTOMY: - ENDOMETRIUM:      - ENDOMETRIOID ADENOCARCINOMA, FIGO GRADE 1.      - SEE CANCER SUMMARY. - MYOMETRIUM:      - INVOLVEMENT BY ENDOMETRIOID ADENOCARCINOMA.      - LEIOMYOMATA UTERI. - UTERINE SEROSA:      - INVOLVED BY ENDOMETRIOID ADENOCARCINOMA. - FALLOPIAN TUBES:      - RIGHT FALLOPIAN TUBE WITH INVOLVEMENT BY ENDOMETRIOID ADENOCARCINOMA, FAVOR DIRECT EXTENSION FROM UTERINE CORNU.      - LEFT FALLOPIAN TUBE WITH NO SIGNIFICANT HISTOPATHOLOGIC CHANGE. - UTERINE CERVIX:      - BENIGN TRANSFORMATION ZONE.      - NEGATIVE FOR SQUAMOUS INTRAEPITHELIAL LESION AND MALIGNANCY. - OVARIES:      - RIGHT OVARY WITH ENLARGED HEMORRHAGIC AND CYSTIC FOLLICLE.      - LEFT OVARY WITH BENIGN PHYSIOLOGIC CHANGES.  B. OMENTUM; OMENTECTOMY: - BENIGN FIBROADIPOSE OMENTAL TISSUE. - NEGATIVE FOR MALIGNANCY.  C. LYMPH NODES, RIGHT PELVIC; EXCISION: - TEN LYMPH NODES, NEGATIVE FOR MALIGNANCY (0/10).  D. LYMPH NODES, LEFT PELVIC; EXCISION: - SEVEN LYMPH NODES, NEGATIVE FOR MALIGNANCY (0/7).   TUMOR Tumor Size: Greatest dimension: 3.0 cm Histologic Type: Endometrioid carcinoma, NOS Histologic Grade: FIGO grade 1 Myometrial Invasion: Present      Depth of invasion (millimeters): 20 mm      Myometrial thickness (millimeters): 20 mm      Percentage of myometrial invasion: 100% Uterine Serosa Involvement: Present Lower Uterine Segment Involvement: Not identified Cervical Stromal Involvement: Not identified Other Tissue/Organ Involvement: Not  identified Peritoneal/Ascitic Fluid: Malignant cells present (see [id number]) Lymphatic and/or Vascular Invasion: Not identified  Tumor cells are positive for estrogen receptor (70-80%, moderate) and  progesterone receptor (greater than 95%, strong).  Tumor displays  wild-type (non-mutated) expression of p53.   Immunohistochemistry (IHC) Testing for DNA Mismatch Repair (MMR)  Proteins:  Results:  MLH1: Intact nuclear expression  MSH2: Intact nuclear expression  MSH6: Intact nuclear expression  PMS2: Intact nuclear expression  IHC Interpretation: No loss of nuclear expression of MMR proteins: Low  probability of MSI-H.   A. PELVIC WASHINGS; LIQUID-BASED PREPARATION:  - MALIGNANT CELLS PRESENT.  - COMPATIBLE WITH METASTATIC ADENOCARCINOMA OF GYNECOLOGIC ORIGIN (SEE  [phone]).    Gynecologic Oncology History Margaret Mercado is a pleasant female who is seen in consultation from Dr. Leafy Ro for adnexal mass and elevated ca 125.    She saw Dr. Christian Mate on 07/20/2022 and he discussed hernia repair at the time of surgery.   CXR 07/14/2022  IMPRESSION: No active cardiopulmonary disease EKG 07/15/2022 EKG - SR/SB with no acute ischemic changes. Patient notified of results by Dr. Einar Pheasant.    Gynecologic Oncology History   Patient was seen by PCP for abdominal pain that was thought to be related to a known hernia. 06/11/22 CT showed adnexal mass on right measuring 14.5 x 9.1 cm with fluid filled serpiginous tubular structure. Possible focal fluid in the fundal portion of the endometrial canal. Large fat containing ventral hernia with a 2cm aperture width. She was seen by Dr. Leafy Ro.   06/18/22- CA 125: 298.   07/07/22- US  Pelvis Uterus: Measurements: 8.6 x 5.4 x 5.8 cm = volume: 140 mL Endometrium: 10.3 mm.  Right ovary: not visualized. 16.1 x 9.9 x 9.3 cm cystic right adnexal mass incompletely visualized Left ovary: not visualized No abnormal free fluid.   06/18/22 HmgA1c -  6.1  06/11/2022 CT A/P   IMPRESSION: 1. Large cystic lesion in the right adnexa measuring 14.5 x 9.1 cm with a associated fluid-filled serpiginous tubular structure. Findings are favored to reflect a large right ovarian cystic lesion with hydrosalpinx. Suggest Ob-gyn consult and further evaluation by pelvic MRI with and without contrast. 2. Mild prominence of the right collecting system and ureter to the level of the large cystic lesion in the right adnexa but with symmetric enhancement and excretion of contrast indicative of a minimally obstructive process. 3. Large fat containing ventral hernia with a 2 cm aperture width. 4.  Aortic Atherosclerosis (ICD10-I70.0).   Lower chest: Solid 3 mm subpleural left lower lobe pulmonary nodule on image 6/3 stable dating back to March 11, 2005 compatible with a benign finding. Vascular/Lymphatic: Aortic atherosclerosis. No pathologically enlarged abdominal pelvic lymph nodes.  Problem List: Patient Active Problem List   Diagnosis Date Noted   Endometrial cancer ([place]) 08/23/2022   Complex ovarian cyst 08/04/2022   Adnexal mass 07/18/2022   Rash 02/21/2022   Hemorrhoids 02/21/2022   Encounter for completion of form with patient 09/24/2021   Headache 09/23/2021   Expressive aphasia 09/13/2021   Breast cancer screening 02/08/2021   Hematoma 01/18/2021   Leg pain 01/18/2021   Type 2 diabetes mellitus with hyperglycemia ([place]) 05/27/2020   Tricuspid valve insufficiency 03/28/2019   Heart murmur 11/08/2018   History of TIA (transient ischemic attack) 11/08/2018   Abnormal mammogram of right breast 05/03/2018   DOE (dyspnea on exertion) 12/13/2017   Stress 12/13/2017   Hyperglycemia 12/12/2017   Healthcare maintenance 05/17/2016   Abdominal pain, left lower quadrant 08/19/2013   Ventral hernia 08/19/2013   Pyuria 08/19/2013   Sleep apnea 06/17/2012   FLUSHING 02/14/2009   Hyperlipidemia LDL goal <70 01/30/2009   Essential hypertension  01/30/2009   CARDIAC ARRHYTHMIA 01/30/2009   Transient cerebral ischemia 01/30/2009    Past Medical History: Past Medical History:  Diagnosis Date   Abnormal echocardiogram 01/2009   Mild LVH, EF >55%, no regional wall motion abnormalities, normal RV, mild aortic insufficiency, no aortic stenosis.   Adnexal mass    Anxiety    Dyspnea    Heart murmur    Hemorrhoids    Hyperlipidemia    Hypertension    Edema with Norvasc. Unable to tolerate Lisinopril/ HCTZ.   Hypokalemia    Manifested by lip and finger tingling   Hypothyroidism    Palpitations    Event monitor 01/2009 with no significant arrhythmias   Sleep apnea    does not use cpap   TIA (transient ischemic attack) 01/2009   CT and MRI of head showed no evidence for stroke. Carotid dopplers showed no significant plaque   Transient cerebral ischemia    Tricuspid valve insufficiency    Ventral hernia     Past Surgical History: Past Surgical History:  Procedure Laterality Date   CRYOABLATION     Of uterus   CYSTOSCOPY N/A 08/04/2022   Procedure: CYSTOSCOPY;  Surgeon: Gillis Ends, MD;  Location: [facility] ORS;  Service: Gynecology;  Laterality: N/A;   ROBOTIC ASSISTED TOTAL HYSTERECTOMY WITH BILATERAL SALPINGO OOPHERECTOMY N/A 08/04/2022   Procedure: XI ROBOTIC ASSISTED TOTAL HYSTERECTOMY WITH BILATERAL SALPINGO OOPHORECTOMY;  Surgeon: Gillis Ends, MD;  Location: [facility] ORS;  Service: Gynecology;  Laterality: N/A;   UMBILICAL HERNIA REPAIR  2010   Dr Nicholes Stairs   URETHRAL DILATION     removal of bladder polyps   VENTRAL HERNIA REPAIR N/A 08/04/2022   Procedure: HERNIA REPAIR VENTRAL ADULT;  Surgeon: [name], MD;  Location: [facility] ORS;  Service: General;  Laterality: N/A;    Past Gynecologic History:  Menarche: age 16 Post menopausal   OB History:  OB History  No obstetric history on file.  G2P2  Family History: Family History  Problem Relation Age of Onset   Hypertension Mother     Arrhythmia Mother        Atrial fibrillation   Hypertension Father    Valvular heart disease Father    Hypertension Brother    Hypertension Paternal Grandmother    Coronary artery disease Neg Hx        Premature   Breast cancer Neg Hx    Colon cancer Neg Hx     Social History: Social History   Socioeconomic History   Marital status: Married    Spouse name: Not on file   Number of children: 2   Years of education: Not on file   Highest education level: Not on file  Occupational History   Occupation: Horticulturist, commercial    Comment: full time  Tobacco Use   Smoking status: Never    Passive exposure: Never   Smokeless tobacco: Never  Vaping Use   Vaping Use: Never used  Substance and Sexual Activity   Alcohol use: No    Alcohol/week: 0.0 standard drinks of alcohol   Drug use: No   Sexual activity: Yes    Birth control/protection: None  Other Topics Concern   Not on file  Social History Narrative   Married   Gets regular exercise   Social Determinants of Health   Financial Resource Strain: Not on file  Food Insecurity: Not on file  Transportation Needs: Not on file  Physical Activity: Not on file  Stress: Not on file  Social Connections: Not on file  Intimate Partner Violence: Not on file    Allergies: Allergies  Allergen Reactions   Vicodin [Hydrocodone-Acetaminophen] Anaphylaxis   Amlodipine Besylate    Cardizem [Diltiazem Hcl] Other (See Comments)    Flushing    Erythromycin Other (See Comments)    Chest heaviness    Hydrocodone-Acetaminophen    Levaquin [Levofloxacin In D5w] Other (See Comments)    Rapid heart beat   Lisinopril Other (See Comments)    Difficulty breathing and rash   Macrobid [Nitrofurantoin Macrocrystal] Other (See Comments)    Wheezing and headache   Oxycodone-Acetaminophen    Sulfonamide Derivatives Rash    Current Medications: Current Outpatient Medications  Medication Sig Dispense Refill   aspirin 81 MG EC tablet Take  81 mg by mouth daily.       docusate sodium (COLACE) 100 MG capsule Take 1 capsule (100 mg total) by mouth 2 (two) times daily. To keep stools soft 30 capsule 0   hydrochlorothiazide (MICROZIDE) 12.5 MG capsule Take 1 capsule (12.5 mg total) by mouth daily. 30 capsule 1   HYDROmorphone (DILAUDID) 2 MG tablet Take 0.5 tablets (1 mg total) by mouth every 6 (six) hours as needed for severe pain. 30 tablet 0   levothyroxine (SYNTHROID) 50 MCG tablet Take 1 tablet (50 mcg total) by mouth daily. (Patient taking differently: Take 50 mcg by mouth daily before  breakfast.) 90 tablet 3   nebivolol (BYSTOLIC) 2.5 MG tablet Take 0.5 tablets (1.25 mg total) by mouth daily. 45 tablet 1   rosuvastatin (CRESTOR) 10 MG tablet Take 1 tablet (10 mg total) by mouth daily. (Patient taking differently: Take 10 mg by mouth every morning.) 90 tablet 1   sertraline (ZOLOFT) 50 MG tablet Take 1 tablet (50 mg total) by mouth daily. (Patient taking differently: Take 50 mg by mouth every morning.) 30 tablet 3   No current facility-administered medications for this visit.    Review of Systems General:  no complaints Skin: no complaints Eyes: no complaints HEENT: no complaints Breasts: no complaints Pulmonary: no complaints Cardiac: no complaints Gastrointestinal: no complaints Genitourinary/Sexual: no complaints Ob/Gyn: no complaints Musculoskeletal: no complaints; no swelling Hematology: no complaints Neurologic/Psych: no complaints Skin: opening of the right laparoscopic incision, now improved   Objective:  Physical Examination:  BP (!) 158/74 (BP Location: Right Arm, Patient Position: Sitting)   Pulse 61   Temp (!) 96 F (35.6 C)   Resp 18   Ht '5\' 4"'$  (1.626 m)   Wt 190 lb (86.2 kg)   SpO2 99%   BMI 32.61 kg/m     Performance status = 0   GENERAL: Patient is a well appearing female in no acute distress. Accompanied.  LUNGS:  Normal respiratory effort.  ABDOMEN:  Soft, nontender, nondistended. No  ascites or hepatosplenomegaly appreciated.  Skin: Well healed incisions. No drainage.  EXTREMITIES:  No peripheral edema.   NEURO:  Nonfocal. Well oriented.  Appropriate affect.  Pelvic: deferred  Lab Review Labs on site today:  Lab Results  Component Value Date   WBC 10.8 (H) 08/04/2022   HGB 12.4 08/04/2022   HCT 37.2 08/04/2022   MCV 95.1 08/04/2022   PLT 221 08/04/2022     Chemistry      Component Value Date/Time   NA 134 (L) 08/04/2022 1528   K 3.7 08/04/2022 1528   CL 103 08/04/2022 1528   CO2 24 08/04/2022 1528   BUN 13 08/04/2022 1528   CREATININE 0.75 08/04/2022 1528      Component Value Date/Time   CALCIUM 8.4 (L) 08/04/2022 1528   ALKPHOS 61 06/18/2022 0738   AST 13 06/18/2022 0738   ALT 12 06/18/2022 0738   BILITOT 0.4 06/18/2022 0738     Lab Results  Component Value Date   TSH 1.194 07/14/2022     Radiologic Imaging: 06/11/2022 Narrative & Impression  CLINICAL DATA:  Hernia suspected, abdominal wall increases postprandial and with exertion   EXAM: CT ABDOMEN AND PELVIS WITH CONTRAST   TECHNIQUE: Multidetector CT imaging of the abdomen and pelvis was performed using the standard protocol following bolus administration of intravenous contrast.   RADIATION DOSE REDUCTION: This exam was performed according to the departmental dose-optimization program which includes automated exposure control, adjustment of the mA and/or kV according to patient size and/or use of iterative reconstruction technique.   CONTRAST:  139m OMNIPAQUE IOHEXOL 300 MG/ML  SOLN   COMPARISON:  CT March 11, 2005   FINDINGS: Lower chest: Solid 3 mm subpleural left lower lobe pulmonary nodule on image 6/3 stable dating back to March 11, 2005 compatible with a benign finding.   Hepatobiliary: No suspicious hepatic lesion. Gallbladder is unremarkable. No biliary ductal dilation.   Pancreas: No pancreatic ductal dilation or evidence of acute inflammation.    Spleen: No splenomegaly.   Adrenals/Urinary Tract: Bilateral adrenal glands appear normal. Mild prominence of the right collecting system  and ureter to the level of a large cystic lesion in the right adnexa left kidney is unremarkable. Urinary bladder is unremarkable for degree of distension.   Stomach/Bowel: Stomach is unremarkable for degree of distension. No pathologic dilation of small or large bowel. Left-sided colonic diverticulosis without findings of acute diverticulitis.   Vascular/Lymphatic: Aortic atherosclerosis. No pathologically enlarged abdominal pelvic lymph nodes.   Reproductive: Large cystic lesion in the right adnexa measures 14.5 x 9.1 cm on image 64/2 with a associated fluid-filled serpiginous tubular structure. Possible focal fluid in the fundal portion of the endometrial canal. Left adnexa is unremarkable.   Other: Large fat containing ventral hernia with a 2 cm aperture width.   Musculoskeletal: Multilevel degenerative changes spine. Degenerative change of the bilateral hips.   IMPRESSION: 1. Large cystic lesion in the right adnexa measuring 14.5 x 9.1 cm with a associated fluid-filled serpiginous tubular structure. Findings are favored to reflect a large right ovarian cystic lesion with hydrosalpinx. Suggest Ob-gyn consult and further evaluation by pelvic MRI with and without contrast. 2. Mild prominence of the right collecting system and ureter to the level of the large cystic lesion in the right adnexa but with symmetric enhancement and excretion of contrast indicative of a minimally obstructive process. 3. Large fat containing ventral hernia with a 2 cm aperture width. 4.  Aortic Atherosclerosis (ICD10-I70.0).   These results will be called to the ordering clinician or representative by the Radiologist Assistant, and communication documented in the PACS or [employer].     Electronically Signed   By: Dahlia Bailiff M.D.   On: 06/12/2022  13:28      Assessment:  DAWANNA LENSCH is a 67 y.o. female diagnosed with stage IIIA endometrioid grade 1 endometrial cancer (pMMR, p53wt, ER/PR+)  with positive cytology.   Benign ovarian cyst  Symptomatic nonreducible hernia s/p prior hernia repair  Hypothyroidism, on replacement, asymptomatic  Medical co-morbidities complicating care: Body mass index is 32.61 kg/m. Mild LVH, HTN, h/o TIA 2010  Plan:   Problem List Items Addressed This Visit       Genitourinary   Endometrial cancer ([place]) - Primary   Other Visit Diagnoses     Counseling and coordination of care           Excellent recovery postoperatively. No evidence of lymphedema.  We discussed options for management and I recommended chemotherapy. Her prognosis with advanced disease is still favorable given the wild-type p53 expression, proficient MMR and positive estrogen and progesterone receptor status.  Suggested return to clinic on 09/07/2021 for exam postoperatively.   I also provided precautions regarding lymphedema.  If she starts to experience any symptoms we can recommend physical therapy for lymphedema management.  The patient's diagnosis, an outline of the further diagnostic and laboratory studies which will be required, the recommendation for surgery, and alternatives were discussed with her and her accompanying family members.  All questions were answered to their satisfaction.  A total of 20 minutes were spent with the patient/family today; >50% was spent in education, counseling and coordination of care for stage III endometrial cancer.   Santiago Glad, MD

## 2022-08-23 NOTE — Progress Notes (Signed)
Hematology/Oncology Consult Note [phone] [phone]     REFERRING PROVIDER: Gillis Ends*    CHIEF COMPLAINTS/PURPOSE OF CONSULTATION:  FIGO stage IIIa endometrial cancer  ASSESSMENT & PLAN:   Cancer Staging  Endometrial cancer [facility]) Staging form: Corpus Uteri - Carcinoma and Carcinosarcoma, AJCC 8th Edition - Clinical stage from 08/23/2022: FIGO Stage IIIA (cT3a, cN0, cM0) - Signed by Earlie Server, MD on 08/23/2022   Endometrial cancer ([place]) stage IIIA endometrioid grade 1 endometrial cancer (pMMR, p53wt, ER/PR+)  with positive pelvic wash cytology.  I discussed with Dr. Theora Gianotti.  We recommend adjuvant chemotherapy with 6 cycles of carboplatin and Taxol.  Adjuvant immunotherapy may be considered, not FDA approved yet. I explained to the patient the risks and benefits of chemotherapy including all but not limited to infusion reaction, hair loss,  mouth sore, nausea, vomiting, diarrhea low blood counts, neuropathy bleeding, kidney failure and risk of life threatening infection and even death, secondary malignancy etc.  Patient is uncertain decided today.  She is concerned about potential side effects affecting her other medical problems.  She would like to further discuss with her husband and update our office regarding her decisions.  # If patient agrees with adjuvant chemotherapy, we will set up chemotherapy education; Antiemetics-Zofran and Compazine; EMLA cream will be sent pharmacy   Goals of care, counseling/discussion Curative intent.  Discussed with patient.   No orders of the defined types were placed in this encounter.  To be determined. All questions were answered. The patient knows to call the clinic with any problems, questions or concerns.  Earlie Server, MD, PhD [facility] Hematology Oncology 08/23/2022    HISTORY OF PRESENTING ILLNESS:  Margaret Mercado 67 y.o. female presents to establish care for FIGO stage IIIa endometrial cancer I  have reviewed her chart and materials related to her cancer extensively and collaborated history with the patient. Summary of oncologic history is as follows: Oncology History  Endometrial cancer ([place])  06/12/2022 Imaging   CT abdomen pelvis with contrast showed  1. Large cystic lesion in the right adnexa measuring 14.5 x 9.1 cm with a associated fluid-filled serpiginous tubular structure.Findings are favored to reflect a large right ovarian cystic lesion with hydrosalpinx. Suggest Ob-gyn consult and further evaluation by pelvic MRI with and without contrast. 2. Mild prominence of the right collecting system and ureter to the level of the large cystic lesion in the right adnexa but with symmetric enhancement and excretion of contrast indicative of a minimally obstructive process. 3. Large fat containing ventral hernia with a 2 cm aperture width.4.  Aortic Atherosclerosis   06/18/2022 Tumor Marker   CA125  298   07/07/2022 Imaging   Transvaginal ultrasound pelvis showed 1. A 16.1 x 9.9 x 9.3 cm cystic right adnexal mass incompletely visualized secondary to its large size. Recommend further evaluation with pelvic MRI without and with intravenous contrast   08/04/2022 Initial Diagnosis   Endometrial cancer pT3a N0 FIGO IIIA, pMMR   Patient initially was evaluated by primary care provider and gynecology for abdominal pain.  She had CT images done which showed right adnexa cystic mass.  08/04/2022 patient underwent robotic assisted total hysterectomy with bilateral salpingo-oophorectomy, hernia repair  A. UTERUS WITH CERVIX, BILATERAL FALLOPIAN TUBES AND OVARIES; TOTAL HYSTERECTOMY WITH BILATERAL SALPINGO-OOPHORECTOMY: - ENDOMETRIUM:      - ENDOMETRIOID ADENOCARCINOMA, FIGO GRADE 1.      - SEE CANCER SUMMARY. - MYOMETRIUM:      - INVOLVEMENT BY ENDOMETRIOID ADENOCARCINOMA.      -  LEIOMYOMATA UTERI. - UTERINE SEROSA:      - INVOLVED BY ENDOMETRIOID ADENOCARCINOMA. - FALLOPIAN TUBES:      -  RIGHT FALLOPIAN TUBE WITH INVOLVEMENT BY ENDOMETRIOID ADENOCARCINOMA, FAVOR DIRECT EXTENSION FROM UTERINE CORNU.      - LEFT FALLOPIAN TUBE WITH NO SIGNIFICANT HISTOPATHOLOGIC CHANGE. - UTERINE CERVIX:      - BENIGN TRANSFORMATION ZONE.      - NEGATIVE FOR SQUAMOUS INTRAEPITHELIAL LESION AND MALIGNANCY. - OVARIES:      - RIGHT OVARY WITH ENLARGED HEMORRHAGIC AND CYSTIC FOLLICLE.      - LEFT OVARY WITH BENIGN PHYSIOLOGIC CHANGES.  B. OMENTUM; OMENTECTOMY: - BENIGN FIBROADIPOSE OMENTAL TISSUE. - NEGATIVE FOR MALIGNANCY.  C. LYMPH NODES, RIGHT PELVIC; EXCISION: - TEN LYMPH NODES, NEGATIVE FOR MALIGNANCY (0/10).  D. LYMPH NODES, LEFT PELVIC; EXCISION: - SEVEN LYMPH NODES, NEGATIVE FOR MALIGNANCY (0/7).  Tumor Size: Greatest dimension: 3.0 cm Histologic Type: Endometrioid carcinoma, NOS Histologic Grade: FIGO grade 1 Myometrial Invasion: Present      Depth of invasion (millimeters): 20 mm      Myometrial thickness (millimeters): 20 mm      Percentage of myometrial invasion: 100% Uterine Serosa Involvement: Present Lower Uterine Segment Involvement: Not identified Cervical Stromal Involvement: Not identified Other Tissue/Organ Involvement: Not identified Peritoneal/Ascitic Fluid: Malignant cells present (see [id number]) Lymphatic and/or Vascular Invasion: Not identified  MARGINS Margin Status: All margins negative for invasive carcinoma   REGIONAL LYMPH NODES  Regional Lymph Node Status: Regional lymph nodes present  All regional lymph nodes negative for tumor cells   Tumor cells are positive for estrogen receptor (70-80%, moderate) and  progesterone receptor (greater than 95%, strong).  Tumor displays  wild-type (non-mutated) expression of p53.   Immunohistochemistry (IHC) Testing for DNA Mismatch Repair (MMR)  Proteins:  Results:  MLH1: Intact nuclear expression  MSH2: Intact nuclear expression  MSH6: Intact nuclear expression  PMS2: Intact nuclear expression  IHC  Interpretation: No loss of nuclear expression of MMR proteins: Low  probability of MSI-H.   A. PELVIC WASHINGS; LIQUID-BASED PREPARATION:  - MALIGNANT CELLS PRESENT.  - COMPATIBLE WITH METASTATIC ADENOCARCINOMA OF GYNECOLOGIC ORIGIN (SEE  [id number])    08/23/2022 Cancer Staging   Staging form: Corpus Uteri - Carcinoma and Carcinosarcoma, AJCC 8th Edition - Clinical stage from 08/23/2022: FIGO Stage IIIA (cT3a, cN0, cM0) - Signed by Earlie Server, MD on 08/23/2022 Stage prefix: Initial diagnosis Histologic grade (G): G1 Histologic grading system: 3 grade system   Today patient presents for postoperative follow-up. She was seen by Dr. Theora Gianotti.  Pathology results were reviewed and she was recommended to have adjuvant chemotherapy.  Patient presents to establish care for discussion of adjuvant chemotherapy. She was accompanied by her husband. She feels very overwhelmed about her pathology and the need of adjuvant chemotherapy.  MEDICAL HISTORY:  Past Medical History:  Diagnosis Date   Abnormal echocardiogram 01/2009   Mild LVH, EF >55%, no regional wall motion abnormalities, normal RV, mild aortic insufficiency, no aortic stenosis.   Adnexal mass    Anxiety    Dyspnea    Heart murmur    Hemorrhoids    Hyperlipidemia    Hypertension    Edema with Norvasc. Unable to tolerate Lisinopril/ HCTZ.   Hypokalemia    Manifested by lip and finger tingling   Hypothyroidism    Palpitations    Event monitor 01/2009 with no significant arrhythmias   Sleep apnea    does not use cpap   TIA (transient ischemic attack) 01/2009  CT and MRI of head showed no evidence for stroke. Carotid dopplers showed no significant plaque   Transient cerebral ischemia    Tricuspid valve insufficiency    Ventral hernia     SURGICAL HISTORY: Past Surgical History:  Procedure Laterality Date   CRYOABLATION     Of uterus   CYSTOSCOPY N/A 08/04/2022   Procedure: CYSTOSCOPY;  Surgeon: Gillis Ends, MD;   Location: [facility] ORS;  Service: Gynecology;  Laterality: N/A;   ROBOTIC ASSISTED TOTAL HYSTERECTOMY WITH BILATERAL SALPINGO OOPHERECTOMY N/A 08/04/2022   Procedure: XI ROBOTIC ASSISTED TOTAL HYSTERECTOMY WITH BILATERAL SALPINGO OOPHORECTOMY;  Surgeon: Gillis Ends, MD;  Location: [facility] ORS;  Service: Gynecology;  Laterality: N/A;   UMBILICAL HERNIA REPAIR  2010   Dr Nicholes Stairs   URETHRAL DILATION     removal of bladder polyps   VENTRAL HERNIA REPAIR N/A 08/04/2022   Procedure: HERNIA REPAIR VENTRAL ADULT;  Surgeon: [name], MD;  Location: [facility] ORS;  Service: General;  Laterality: N/A;    SOCIAL HISTORY: Social History   Socioeconomic History   Marital status: Married    Spouse name: Not on file   Number of children: 2   Years of education: Not on file   Highest education level: Not on file  Occupational History   Occupation: Horticulturist, commercial    Comment: full time  Tobacco Use   Smoking status: Never    Passive exposure: Never   Smokeless tobacco: Never  Vaping Use   Vaping Use: Never used  Substance and Sexual Activity   Alcohol use: No    Alcohol/week: 0.0 standard drinks of alcohol   Drug use: No   Sexual activity: Yes    Birth control/protection: None  Other Topics Concern   Not on file  Social History Narrative   Married   Gets regular exercise   Social Determinants of Health   Financial Resource Strain: Not on file  Food Insecurity: Not on file  Transportation Needs: Not on file  Physical Activity: Not on file  Stress: Not on file  Social Connections: Not on file  Intimate Partner Violence: Not on file    FAMILY HISTORY: Family History  Problem Relation Age of Onset   Hypertension Mother    Arrhythmia Mother        Atrial fibrillation   Hypertension Father    Valvular heart disease Father    Hypertension Brother    Lung cancer Maternal Uncle    Hypertension Paternal Grandmother    Coronary artery disease Neg Hx        Premature    Breast cancer Neg Hx    Colon cancer Neg Hx     ALLERGIES:  is allergic to vicodin [hydrocodone-acetaminophen], amlodipine besylate, cardizem [diltiazem hcl], erythromycin, hydrocodone-acetaminophen, levaquin [levofloxacin in d5w], lisinopril, macrobid [nitrofurantoin macrocrystal], oxycodone-acetaminophen, and sulfonamide derivatives.  MEDICATIONS:  Current Outpatient Medications  Medication Sig Dispense Refill   aspirin 81 MG EC tablet Take 81 mg by mouth daily.       docusate sodium (COLACE) 100 MG capsule Take 1 capsule (100 mg total) by mouth 2 (two) times daily. To keep stools soft 30 capsule 0   hydrochlorothiazide (MICROZIDE) 12.5 MG capsule Take 1 capsule (12.5 mg total) by mouth daily. 30 capsule 1   HYDROmorphone (DILAUDID) 2 MG tablet Take 0.5 tablets (1 mg total) by mouth every 6 (six) hours as needed for severe pain. 30 tablet 0   levothyroxine (SYNTHROID) 50 MCG tablet Take 1 tablet (50  mcg total) by mouth daily. (Patient taking differently: Take 50 mcg by mouth daily before breakfast.) 90 tablet 3   nebivolol (BYSTOLIC) 2.5 MG tablet Take 0.5 tablets (1.25 mg total) by mouth daily. 45 tablet 1   rosuvastatin (CRESTOR) 10 MG tablet Take 1 tablet (10 mg total) by mouth daily. (Patient taking differently: Take 10 mg by mouth every morning.) 90 tablet 1   sertraline (ZOLOFT) 50 MG tablet Take 1 tablet (50 mg total) by mouth daily. (Patient taking differently: Take 50 mg by mouth every morning.) 30 tablet 3   No current facility-administered medications for this visit.    Review of Systems  Constitutional:  Negative for appetite change, chills, fatigue and fever.  HENT:   Negative for hearing loss and voice change.   Eyes:  Negative for eye problems.  Respiratory:  Negative for chest tightness and cough.   Cardiovascular:  Negative for chest pain.  Gastrointestinal:  Negative for abdominal distention, abdominal pain and blood in stool.  Endocrine: Negative for hot flashes.   Genitourinary:  Negative for difficulty urinating and frequency.   Musculoskeletal:  Negative for arthralgias.  Skin:  Negative for itching and rash.  Neurological:  Negative for extremity weakness.  Hematological:  Negative for adenopathy.  Psychiatric/Behavioral:  Negative for confusion.      PHYSICAL EXAMINATION: ECOG PERFORMANCE STATUS: 0 - Asymptomatic  Vitals:   08/23/22 0929  BP: (!) 158/74  Pulse: 61  Resp: 18  Temp: (!) 96 F (35.6 C)  SpO2: 99%   Filed Weights   08/23/22 0929  Weight: 190 lb (86.2 kg)    Physical Exam Constitutional:      General: She is not in acute distress.    Appearance: She is not diaphoretic.  HENT:     Head: Normocephalic and atraumatic.  Eyes:     General: No scleral icterus. Cardiovascular:     Rate and Rhythm: Normal rate.  Pulmonary:     Effort: Pulmonary effort is normal. No respiratory distress.  Abdominal:     General: There is no distension.  Musculoskeletal:        General: Normal range of motion.     Cervical back: Normal range of motion.  Skin:    Findings: No rash.  Neurological:     Mental Status: She is alert and oriented to person, place, and time. Mental status is at baseline.     Cranial Nerves: No cranial nerve deficit.     Motor: No abnormal muscle tone.     Coordination: Coordination normal.  Psychiatric:        Mood and Affect: Affect normal.      LABORATORY DATA:  I have reviewed the data as listed    Latest Ref Rng & Units 08/04/2022    3:28 PM 07/14/2022    4:07 PM 09/21/2021    2:12 PM  CBC  WBC 4.0 - 10.5 K/uL 10.8  6.4  6.5   Hemoglobin 12.0 - 15.0 g/dL 12.4  12.9  13.5   Hematocrit 36.0 - 46.0 % 37.2  38.3  41.1   Platelets 150 - 400 K/uL 221  219  260       Latest Ref Rng & Units 08/04/2022    3:28 PM 08/02/2022    2:38 PM 06/18/2022    7:38 AM  CMP  Glucose 70 - 99 mg/dL 188  113  127   BUN 8 - 23 mg/dL '13  18  15   '$ Creatinine 0.44 - 1.00  mg/dL 0.75  0.65  0.71   Sodium 135 - 145  mmol/L 134  140  140   Potassium 3.5 - 5.1 mmol/L 3.7  4.1  4.0   Chloride 98 - 111 mmol/L 103  106  104   CO2 22 - 32 mmol/L '24  27  26   '$ Calcium 8.9 - 10.3 mg/dL 8.4  8.9  9.4   Total Protein 6.0 - 8.3 g/dL   6.4   Total Bilirubin 0.2 - 1.2 mg/dL   0.4   Alkaline Phos 39 - 117 U/L   61   AST 0 - 37 U/L   13   ALT 0 - 35 U/L   12      RADIOGRAPHIC STUDIES: I have personally reviewed the radiological images as listed and agreed with the findings in the report. No results found.

## 2022-08-31 ENCOUNTER — Telehealth: Payer: Self-pay | Admitting: *Deleted

## 2022-08-31 NOTE — Telephone Encounter (Signed)
FMLA form signed and copied. Patient notified that it is ready to be picked up by leaving a message on her voice mail

## 2022-08-31 NOTE — Telephone Encounter (Signed)
FMLA form for son received and completed and sent for signature.

## 2022-09-01 ENCOUNTER — Inpatient Hospital Stay (HOSPITAL_BASED_OUTPATIENT_CLINIC_OR_DEPARTMENT_OTHER): Payer: 59 | Admitting: Hospice and Palliative Medicine

## 2022-09-01 DIAGNOSIS — C541 Malignant neoplasm of endometrium: Secondary | ICD-10-CM

## 2022-09-01 NOTE — Progress Notes (Signed)
Multidisciplinary Oncology Council Documentation  Margaret Mercado was presented by our [facility] on 09/01/2022, which included representatives from:  Palliative Care Dietitian  Physical/Occupational Therapist Nurse Navigator Genetics Speech Therapist Social work Survivorship RN Financial Navigator Research RN   Margaret Mercado currently presents with history of endometrial cancer  We reviewed previous medical and familial history, history of present illness, and recent lab results along with all available histopathologic and imaging studies. The [facility] considered available treatment options and made the following recommendations/referrals:  Nutrition, rehab screening, SW  The MOC is a meeting of clinicians from various specialty areas who evaluate and discuss patients for whom a multidisciplinary approach is being considered. Final determinations in the plan of care are those of the provider(s).   Today's extended care, comprehensive team conference, Margaret Mercado was not present for the discussion and was not examined.

## 2022-09-08 ENCOUNTER — Inpatient Hospital Stay: Payer: 59

## 2022-09-08 ENCOUNTER — Encounter: Payer: Self-pay | Admitting: Obstetrics and Gynecology

## 2022-09-08 ENCOUNTER — Inpatient Hospital Stay (HOSPITAL_BASED_OUTPATIENT_CLINIC_OR_DEPARTMENT_OTHER): Payer: 59 | Admitting: Obstetrics and Gynecology

## 2022-09-08 VITALS — BP 141/94 | HR 72 | Temp 96.0°F | Resp 17 | Wt 186.0 lb

## 2022-09-08 DIAGNOSIS — Z90710 Acquired absence of both cervix and uterus: Secondary | ICD-10-CM

## 2022-09-08 DIAGNOSIS — C541 Malignant neoplasm of endometrium: Secondary | ICD-10-CM

## 2022-09-08 DIAGNOSIS — Z90722 Acquired absence of ovaries, bilateral: Secondary | ICD-10-CM

## 2022-09-08 NOTE — Progress Notes (Signed)
Patient here for gyncology oncology appointment concerns of slight vaginal spotting

## 2022-09-08 NOTE — Progress Notes (Signed)
Gynecologic Oncology Interval Visit   Referring Provider: Dr. Leafy Ro  Chief Complaint: endometrial cancer  Subjective:  Margaret Mercado is a 67 y.o. female who is seen in consultation from Dr. Leafy Ro for adnexal mass and elevated ca 125.   She presents today for her postop visit. She saw Dr. Tasia Catchings on 08/23/2022 and was counseled about chemotherapy. She has opted not to proceed with chemotherapy. She has noted some vaginal spotting which is decreasing.  She is recovering from surgery well.    Gynecologic Oncology History Margaret Mercado is a pleasant female who is seen in consultation from Dr. Leafy Ro for adnexal mass and elevated ca 125.    She saw Dr. Christian Mate on 07/20/2022 and he discussed hernia repair at the time of surgery.   CXR 07/14/2022  IMPRESSION: No active cardiopulmonary disease EKG 07/15/2022 EKG - SR/SB with no acute ischemic changes. Patient notified of results by Dr. Einar Pheasant.    Gynecologic Oncology History   Patient was seen by PCP for abdominal pain that was thought to be related to a known hernia. 06/11/22 CT showed adnexal mass on right measuring 14.5 x 9.1 cm with fluid filled serpiginous tubular structure. Possible focal fluid in the fundal portion of the endometrial canal. Large fat containing ventral hernia with a 2cm aperture width. She was seen by Dr. Leafy Ro.   06/18/22- CA 125: 298.   07/07/22- US Pelvis Uterus: Measurements: 8.6 x 5.4 x 5.8 cm = volume: 140 mL Endometrium: 10.3 mm.  Right ovary: not visualized. 16.1 x 9.9 x 9.3 cm cystic right adnexal mass incompletely visualized Left ovary: not visualized No abnormal free fluid.   06/18/22 HmgA1c - 6.1  06/11/2022 CT A/P IMPRESSION: 1. Large cystic lesion in the right adnexa measuring 14.5 x 9.1 cm with a associated fluid-filled serpiginous tubular structure. Findings are favored to reflect a large right ovarian cystic lesion with hydrosalpinx. Suggest Ob-gyn consult and further evaluation by pelvic  MRI with and without contrast. 2. Mild prominence of the right collecting system and ureter to the level of the large cystic lesion in the right adnexa but with symmetric enhancement and excretion of contrast indicative of a minimally obstructive process. 3. Large fat containing ventral hernia with a 2 cm aperture width. 4.  Aortic Atherosclerosis (ICD10-I70.0).   Lower chest: Solid 3 mm subpleural left lower lobe pulmonary nodule on image 6/3 stable dating back to March 11, 2005 compatible with a benign finding. Vascular/Lymphatic: Aortic atherosclerosis. No pathologically enlarged abdominal pelvic lymph nodes.  08/04/2022 Exam under anesthesia, Robotic assisted total hysterectomy and bilateral salpingo-oophorectomy with extensive adhesiolysis including enterolysis and ovariolysis, right ureterolysis, and bilateral pelvic node dissection/hernia repair   Pathology:  ENDOMETRIOID ADENOCARCINOMA, FIGO GRADE 1; involvement with MYOMETRIUM (20/20 mm), UTERINE SEROSA:RIGHT FALLOPIAN TUBE WITH INVOLVEMENT BY ENDOMETRIOID ADENOCARCINOMA, FAVOR DIRECT EXTENSION FROM UTERINE CORNU. OMENTUM AND BILATERAL PELVIC NODES NEGATIVE FOR MALIGNANCY (0/10, 0/7).  ER (70-80%, moderate), PR (greater than 95%, strong), wild-type (non-mutated) expression of p53.  MLH1: Intact nuclear expression  MSH2: Intact nuclear expression  MSH6: Intact nuclear expression  PMS2: Intact nuclear expression  IHC Interpretation: No loss of nuclear expression of MMR proteins: Low  probability of MSI-H.   A. PELVIC WASHINGS; LIQUID-BASED PREPARATION:  - MALIGNANT CELLS PRESENT.  - COMPATIBLE WITH METASTATIC ADENOCARCINOMA OF GYNECOLOGIC ORIGIN (SEE  [phone]).    Problem List: Patient Active Problem List   Diagnosis Date Noted   Endometrial cancer ([place]) 08/23/2022   Goals of care, counseling/discussion 08/23/2022  Complex ovarian cyst 08/04/2022   Adnexal mass 07/18/2022   Rash 02/21/2022   Hemorrhoids 02/21/2022    Encounter for completion of form with patient 09/24/2021   Headache 09/23/2021   Expressive aphasia 09/13/2021   Breast cancer screening 02/08/2021   Hematoma 01/18/2021   Leg pain 01/18/2021   Type 2 diabetes mellitus with hyperglycemia ([place]) 05/27/2020   Tricuspid valve insufficiency 03/28/2019   Heart murmur 11/08/2018   History of TIA (transient ischemic attack) 11/08/2018   Abnormal mammogram of right breast 05/03/2018   DOE (dyspnea on exertion) 12/13/2017   Stress 12/13/2017   Hyperglycemia 12/12/2017   Healthcare maintenance 05/17/2016   Abdominal pain, left lower quadrant 08/19/2013   Ventral hernia 08/19/2013   Pyuria 08/19/2013   Sleep apnea 06/17/2012   FLUSHING 02/14/2009   Hyperlipidemia LDL goal <70 01/30/2009   Essential hypertension 01/30/2009   CARDIAC ARRHYTHMIA 01/30/2009   Transient cerebral ischemia 01/30/2009    Past Medical History: Past Medical History:  Diagnosis Date   Abnormal echocardiogram 01/2009   Mild LVH, EF >55%, no regional wall motion abnormalities, normal RV, mild aortic insufficiency, no aortic stenosis.   Adnexal mass    Anxiety    Dyspnea    Heart murmur    Hemorrhoids    Hyperlipidemia    Hypertension    Edema with Norvasc. Unable to tolerate Lisinopril/ HCTZ.   Hypokalemia    Manifested by lip and finger tingling   Hypothyroidism    Palpitations    Event monitor 01/2009 with no significant arrhythmias   Sleep apnea    does not use cpap   TIA (transient ischemic attack) 01/2009   CT and MRI of head showed no evidence for stroke. Carotid dopplers showed no significant plaque   Transient cerebral ischemia    Tricuspid valve insufficiency    Ventral hernia     Past Surgical History: Past Surgical History:  Procedure Laterality Date   CRYOABLATION     Of uterus   CYSTOSCOPY N/A 08/04/2022   Procedure: CYSTOSCOPY;  Surgeon: Gillis Ends, MD;  Location: [facility] ORS;  Service: Gynecology;  Laterality: N/A;    ROBOTIC ASSISTED TOTAL HYSTERECTOMY WITH BILATERAL SALPINGO OOPHERECTOMY N/A 08/04/2022   Procedure: XI ROBOTIC ASSISTED TOTAL HYSTERECTOMY WITH BILATERAL SALPINGO OOPHORECTOMY;  Surgeon: Gillis Ends, MD;  Location: [facility] ORS;  Service: Gynecology;  Laterality: N/A;   UMBILICAL HERNIA REPAIR  2010   Dr Nicholes Stairs   URETHRAL DILATION     removal of bladder polyps   VENTRAL HERNIA REPAIR N/A 08/04/2022   Procedure: HERNIA REPAIR VENTRAL ADULT;  Surgeon: [name], MD;  Location: [facility] ORS;  Service: General;  Laterality: N/A;    Past Gynecologic History:  Menarche: age 39 Post menopausal   OB History:  OB History  No obstetric history on file.  G2P2  Family History: Family History  Problem Relation Age of Onset   Hypertension Mother    Arrhythmia Mother        Atrial fibrillation   Hypertension Father    Valvular heart disease Father    Hypertension Brother    Lung cancer Maternal Uncle    Hypertension Paternal Grandmother    Coronary artery disease Neg Hx        Premature   Breast cancer Neg Hx    Colon cancer Neg Hx     Social History: Social History   Socioeconomic History   Marital status: Married    Spouse name: Not on file   Number  of children: 2   Years of education: Not on file   Highest education level: Not on file  Occupational History   Occupation: Horticulturist, commercial    Comment: full time  Tobacco Use   Smoking status: Never    Passive exposure: Never   Smokeless tobacco: Never  Vaping Use   Vaping Use: Never used  Substance and Sexual Activity   Alcohol use: No    Alcohol/week: 0.0 standard drinks of alcohol   Drug use: No   Sexual activity: Yes    Birth control/protection: None  Other Topics Concern   Not on file  Social History Narrative   Married   Gets regular exercise   Social Determinants of Health   Financial Resource Strain: Not on file  Food Insecurity: Not on file  Transportation Needs: Not on file  Physical  Activity: Not on file  Stress: Not on file  Social Connections: Not on file  Intimate Partner Violence: Not on file    Allergies: Allergies  Allergen Reactions   Vicodin [Hydrocodone-Acetaminophen] Anaphylaxis   Amlodipine Besylate    Cardizem [Diltiazem Hcl] Other (See Comments)    Flushing    Erythromycin Other (See Comments)    Chest heaviness    Hydrocodone-Acetaminophen    Levaquin [Levofloxacin In D5w] Other (See Comments)    Rapid heart beat   Lisinopril Other (See Comments)    Difficulty breathing and rash   Macrobid [Nitrofurantoin Macrocrystal] Other (See Comments)    Wheezing and headache   Oxycodone-Acetaminophen    Sulfonamide Derivatives Rash    Current Medications: Current Outpatient Medications  Medication Sig Dispense Refill   aspirin 81 MG EC tablet Take 81 mg by mouth daily.       docusate sodium (COLACE) 100 MG capsule Take 1 capsule (100 mg total) by mouth 2 (two) times daily. To keep stools soft 30 capsule 0   hydrochlorothiazide (MICROZIDE) 12.5 MG capsule Take 1 capsule (12.5 mg total) by mouth daily. 30 capsule 1   HYDROmorphone (DILAUDID) 2 MG tablet Take 0.5 tablets (1 mg total) by mouth every 6 (six) hours as needed for severe pain. 30 tablet 0   levothyroxine (SYNTHROID) 50 MCG tablet Take 1 tablet (50 mcg total) by mouth daily. (Patient taking differently: Take 50 mcg by mouth daily before breakfast.) 90 tablet 3   nebivolol (BYSTOLIC) 2.5 MG tablet Take 0.5 tablets (1.25 mg total) by mouth daily. 45 tablet 1   rosuvastatin (CRESTOR) 10 MG tablet Take 1 tablet (10 mg total) by mouth daily. (Patient taking differently: Take 10 mg by mouth every morning.) 90 tablet 1   sertraline (ZOLOFT) 50 MG tablet Take 1 tablet (50 mg total) by mouth daily. (Patient taking differently: Take 50 mg by mouth every morning.) 30 tablet 3   No current facility-administered medications for this visit.    Review of Systems General: no complaints  HEENT: no  complaints  Lungs: no complaints  Cardiac: no complaints  GI: no complaints  GU: vaginal spotting  Musculoskeletal: no complaints  Extremities: no complaints  Skin: no complaints  Neuro: no complaints  Endocrine: no complaints  Psych: no complaints     Memory loss after surgery.   Objective:  Physical Examination:  BP (!) 141/94 Comment: 2nd check, advised to f/u with PCP  Pulse 72   Temp (!) 96 F (35.6 C) (Tympanic)   Resp 17   Wt 186 lb (84.4 kg)   SpO2 100%   BMI 31.93 kg/m  Performance status = 0  GENERAL: Patient is a well appearing female in no acute distress HEENT:  Atraumatic and normocephalic. PERRL, neck supple. ABDOMEN:  Soft, nontender. Nondistended. No masses/ascites/hernia/or hepatomegaly.  EXTREMITIES:  No peripheral edema.   SKIN:  Clear with no obvious rashes; incisions well healed NEURO:  Nonfocal. Well oriented.  Appropriate affect.  Pelvic: EGBUS: no lesions Cervix: surgically absent Vagina: no lesions, small amount of discharge and a small active area of bleeding in the middle of the cuff.  This was treated with silver nitrate. Uterus: surgically absent BME: no palpable masses.  The cuff is indurated which is consistent with healing.  There is no nodularity and the suture line is intact Rectovaginal: deferred   Lab Review Labs on site today:  Lab Results  Component Value Date   WBC 10.8 (H) 08/04/2022   HGB 12.4 08/04/2022   HCT 37.2 08/04/2022   MCV 95.1 08/04/2022   PLT 221 08/04/2022     Chemistry      Component Value Date/Time   NA 134 (L) 08/04/2022 1528   K 3.7 08/04/2022 1528   CL 103 08/04/2022 1528   CO2 24 08/04/2022 1528   BUN 13 08/04/2022 1528   CREATININE 0.75 08/04/2022 1528      Component Value Date/Time   CALCIUM 8.4 (L) 08/04/2022 1528   ALKPHOS 61 06/18/2022 0738   AST 13 06/18/2022 0738   ALT 12 06/18/2022 0738   BILITOT 0.4 06/18/2022 0738     Lab Results  Component Value Date   TSH 1.194  07/14/2022     Radiologic Imaging: 06/11/2022 Narrative & Impression  CLINICAL DATA:  Hernia suspected, abdominal wall increases postprandial and with exertion   EXAM: CT ABDOMEN AND PELVIS WITH CONTRAST   TECHNIQUE: Multidetector CT imaging of the abdomen and pelvis was performed using the standard protocol following bolus administration of intravenous contrast.   RADIATION DOSE REDUCTION: This exam was performed according to the departmental dose-optimization program which includes automated exposure control, adjustment of the mA and/or kV according to patient size and/or use of iterative reconstruction technique.   CONTRAST:  156mL OMNIPAQUE IOHEXOL 300 MG/ML  SOLN   COMPARISON:  CT March 11, 2005   FINDINGS: Lower chest: Solid 3 mm subpleural left lower lobe pulmonary nodule on image 6/3 stable dating back to March 11, 2005 compatible with a benign finding.   Hepatobiliary: No suspicious hepatic lesion. Gallbladder is unremarkable. No biliary ductal dilation.   Pancreas: No pancreatic ductal dilation or evidence of acute inflammation.   Spleen: No splenomegaly.   Adrenals/Urinary Tract: Bilateral adrenal glands appear normal. Mild prominence of the right collecting system and ureter to the level of a large cystic lesion in the right adnexa left kidney is unremarkable. Urinary bladder is unremarkable for degree of distension.   Stomach/Bowel: Stomach is unremarkable for degree of distension. No pathologic dilation of small or large bowel. Left-sided colonic diverticulosis without findings of acute diverticulitis.   Vascular/Lymphatic: Aortic atherosclerosis. No pathologically enlarged abdominal pelvic lymph nodes.   Reproductive: Large cystic lesion in the right adnexa measures 14.5 x 9.1 cm on image 64/2 with a associated fluid-filled serpiginous tubular structure. Possible focal fluid in the fundal portion of the endometrial canal. Left adnexa is  unremarkable.   Other: Large fat containing ventral hernia with a 2 cm aperture width.   Musculoskeletal: Multilevel degenerative changes spine. Degenerative change of the bilateral hips.   IMPRESSION: 1. Large cystic lesion in the right adnexa measuring 14.5  x 9.1 cm with a associated fluid-filled serpiginous tubular structure. Findings are favored to reflect a large right ovarian cystic lesion with hydrosalpinx. Suggest Ob-gyn consult and further evaluation by pelvic MRI with and without contrast. 2. Mild prominence of the right collecting system and ureter to the level of the large cystic lesion in the right adnexa but with symmetric enhancement and excretion of contrast indicative of a minimally obstructive process. 3. Large fat containing ventral hernia with a 2 cm aperture width. 4.  Aortic Atherosclerosis (ICD10-I70.0).   These results will be called to the ordering clinician or representative by the Radiologist Assistant, and communication documented in the PACS or [employer].     Electronically Signed   By: Dahlia Bailiff M.D.   On: 06/12/2022 13:28      Assessment:  Margaret Mercado is a 67 y.o. female diagnosed with stage IIIA endometrioid grade 1 endometrial cancer (pMMR, p53wt, ER/PR+)  with positive cytology.   Benign ovarian cyst  Symptomatic nonreducible hernia s/p prior hernia repair; no evidence of recurrent hernia on exam  Hypothyroidism, on replacement, asymptomatic  Medical co-morbidities complicating care: Body mass index is 31.93 kg/m. Mild LVH, HTN, h/o TIA 2010  Plan:   Problem List Items Addressed This Visit       Genitourinary   Endometrial cancer ([place]) - Primary (Chronic)      We discussed options for management. While I recommended chemotherapy she has declined. We discussed other options today including radiation therapy followed by letrozole.  She is not very interested in radiation either but is willing to have a radiation  oncology consult.  Another option is single agent letrozole.  He is planning on taking experimental drug, PDE5. She shared the manuscript with me and it appears to be a preclinical study. I cautioned her that it did not appear to be a phase II or phase III trial and this is not a proven approach.   I previously provided precautions regarding lymphedema.  If she starts to experience any symptoms we can recommend physical therapy for lymphedema management.  With regard to memory issues I can not determine if that is due to anesthesia. There are reports of cognitive function issues however, that is typically with longer cardiac cases. She is concerned about working as she continues to recover from surgery and navigate treatment for endometrial cancer. We agree that she may need to reduce her work load at this time and focus on her health.   She will follow-up in 4 to 6 weeks to ensure that vaginal cuff healing is complete.  At that time we will review recommendations for surveillance.  Given high risk disease and lack of standard of care adjuvant therapy I have recommended imaging studies in 3 months followed by repeat imaging after that every 6 months for 2 years.  The patient's diagnosis, an outline of the further diagnostic and laboratory studies which will be required, the recommendation for surgery, and alternatives were discussed with her and her accompanying family members.  All questions were answered to their satisfaction.  A total of 15 minutes were spent with the patient/family today; >50% was spent in education, counseling and coordination of care for stage III endometrial cancer.   Santiago Glad, MD

## 2022-09-13 ENCOUNTER — Telehealth: Payer: Self-pay | Admitting: *Deleted

## 2022-09-13 NOTE — Telephone Encounter (Signed)
Received FMLA for this patient, I have placed a call to patient fro more information as to how to prceed with this request, I had to leave a message on her voice mail.

## 2022-09-14 NOTE — Telephone Encounter (Signed)
I spoke with patient yesterday, form completed, signed by physician, and faxed to [employer]. Patient aware

## 2022-09-15 ENCOUNTER — Inpatient Hospital Stay: Payer: 59 | Admitting: Occupational Therapy

## 2022-09-16 ENCOUNTER — Ambulatory Visit: Payer: 59 | Admitting: Radiation Oncology

## 2022-09-21 ENCOUNTER — Encounter: Payer: Self-pay | Admitting: Licensed Clinical Social Worker

## 2022-09-21 NOTE — Progress Notes (Signed)
[facility] Clinical Social Work  Clinical Social Work was referred by medical provider for assessment of psychosocial needs.  Clinical Social Worker attempted to contact patient by phone  to offer support and assess for needs.  CSW left voicemail with contact information and request for return call.   FA   Layman Gully, LCSW  Clinical Social Worker [facility]          

## 2022-09-24 ENCOUNTER — Other Ambulatory Visit: Payer: Self-pay

## 2022-09-30 ENCOUNTER — Other Ambulatory Visit: Payer: Self-pay

## 2022-10-04 ENCOUNTER — Other Ambulatory Visit: Payer: Self-pay

## 2022-10-04 ENCOUNTER — Other Ambulatory Visit: Payer: Self-pay | Admitting: Internal Medicine

## 2022-10-04 MED ORDER — ROSUVASTATIN CALCIUM 10 MG PO TABS
10.0000 mg | ORAL_TABLET | Freq: Every day | ORAL | 0 refills | Status: DC
  Filled 2022-10-04 – 2022-10-27 (×2): qty 90, 90d supply, fill #0

## 2022-10-04 NOTE — Telephone Encounter (Signed)
Rx ok'd for crestor

## 2022-10-05 ENCOUNTER — Other Ambulatory Visit: Payer: Self-pay

## 2022-10-06 ENCOUNTER — Inpatient Hospital Stay: Payer: 59 | Attending: Obstetrics and Gynecology | Admitting: Obstetrics and Gynecology

## 2022-10-06 VITALS — BP 134/54 | HR 62 | Wt 189.0 lb

## 2022-10-06 DIAGNOSIS — C541 Malignant neoplasm of endometrium: Secondary | ICD-10-CM

## 2022-10-06 DIAGNOSIS — Z90722 Acquired absence of ovaries, bilateral: Secondary | ICD-10-CM

## 2022-10-06 DIAGNOSIS — Z90710 Acquired absence of both cervix and uterus: Secondary | ICD-10-CM

## 2022-10-06 NOTE — Progress Notes (Signed)
Gynecologic Oncology Interval Visit   Referring Provider: Dr. Dalbert Garnet  Chief Complaint: endometrial cancer  Subjective:  Margaret Mercado is a 67 y.o. female who is seen in consultation from Dr. Dalbert Garnet for adnexal mass and elevated ca 125.   She presents today for her postop visit. She saw Dr. Cathie Hoops on 08/23/2022 and was counseled about chemotherapy. She has opted not to proceed with chemotherapy. She did not see Radiation Oncology. She is doing a treatment and would like to have the blood based tumor markers repeated.    Gynecologic Oncology History  Margaret Mercado is a pleasant female who is seen in consultation from Dr. Dalbert Garnet for adnexal mass and elevated ca 125.   Patient was initially seen by PCP for abdominal pain that was thought to be related to a known hernia. 06/11/22 CT showed adnexal mass on right measuring 14.5 x 9.1 cm with fluid filled serpiginous tubular structure. Possible focal fluid in the fundal portion of the endometrial canal. Large fat containing ventral hernia with a 2cm aperture width.  06/11/2022 CT A/P IMPRESSION: 1. Large cystic lesion in the right adnexa measuring 14.5 x 9.1 cm with a associated fluid-filled serpiginous tubular structure. Findings are favored to reflect a large right ovarian cystic lesion with hydrosalpinx. Suggest Ob-gyn consult and further evaluation by pelvic MRI with and without contrast. 2. Mild prominence of the right collecting system and ureter to the level of the large cystic lesion in the right adnexa but with symmetric enhancement and excretion of contrast indicative of a minimally obstructive process. 3. Large fat containing ventral hernia with a 2 cm aperture width. 4.  Aortic Atherosclerosis (ICD10-I70.0).   Lower chest: Solid 3 mm subpleural left lower lobe pulmonary nodule on image 6/3 stable dating back to March 11, 2005 compatible with a benign finding. Vascular/Lymphatic: Aortic atherosclerosis. No  pathologically enlarged abdominal pelvic lymph nodes.  She was seen by Dr. Dalbert Garnet.   06/18/22- CA 125: 298.  HbA1c - 6.1  07/07/22- US Pelvis Uterus: Measurements: 8.6 x 5.4 x 5.8 cm = volume: 140 mL Endometrium: 10.3 mm.  Right ovary: not visualized. 16.1 x 9.9 x 9.3 cm cystic right adnexal mass incompletely visualized Left ovary: not visualized No abnormal free fluid.   CXR 07/14/2022  IMPRESSION: No active cardiopulmonary disease EKG 07/15/2022 EKG - SR/SB with no acute ischemic changes. Patient notified of results by Dr. Dale [place].   07/20/2022 She saw Dr. Claudine Mouton and he discussed hernia repair at the time of surgery.   08/04/2022 Exam under anesthesia, Robotic assisted total hysterectomy and bilateral salpingo-oophorectomy with extensive adhesiolysis including enterolysis and ovariolysis, right ureterolysis, and bilateral pelvic node dissection/hernia repair   Pathology:  ENDOMETRIOID ADENOCARCINOMA, FIGO GRADE 1; involvement with MYOMETRIUM (20/20 mm), UTERINE SEROSA:RIGHT FALLOPIAN TUBE WITH INVOLVEMENT BY ENDOMETRIOID ADENOCARCINOMA, FAVOR DIRECT EXTENSION FROM UTERINE CORNU. OMENTUM AND BILATERAL PELVIC NODES NEGATIVE FOR MALIGNANCY (0/10, 0/7).  ER (70-80%, moderate), PR (greater than 95%, strong), wild-type (non-mutated) expression of p53.  MLH1: Intact nuclear expression  MSH2: Intact nuclear expression  MSH6: Intact nuclear expression  PMS2: Intact nuclear expression  IHC Interpretation: No loss of nuclear expression of MMR proteins: Low  probability of MSI-H.   A. PELVIC WASHINGS; LIQUID-BASED PREPARATION:  - MALIGNANT CELLS PRESENT.  - COMPATIBLE WITH METASTATIC ADENOCARCINOMA OF GYNECOLOGIC ORIGIN (SEE  [phone]).   She opted for no chemotherapy or radiation therapy at this time.   Problem List: Patient Active Problem List   Diagnosis Date Noted   Endometrial cancer  08/23/2022   Goals of care, counseling/discussion 08/23/2022   Complex ovarian cyst  08/04/2022   Adnexal mass 07/18/2022   Rash 02/21/2022   Hemorrhoids 02/21/2022   Encounter for completion of form with patient 09/24/2021   Headache 09/23/2021   Expressive aphasia 09/13/2021   Breast cancer screening 02/08/2021   Hematoma 01/18/2021   Leg pain 01/18/2021   Type 2 diabetes mellitus with hyperglycemia 05/27/2020   Tricuspid valve insufficiency 03/28/2019   Heart murmur 11/08/2018   History of TIA (transient ischemic attack) 11/08/2018   Abnormal mammogram of right breast 05/03/2018   DOE (dyspnea on exertion) 12/13/2017   Stress 12/13/2017   Hyperglycemia 12/12/2017   Healthcare maintenance 05/17/2016   Abdominal pain, left lower quadrant 08/19/2013   Ventral hernia 08/19/2013   Pyuria 08/19/2013   Sleep apnea 06/17/2012   FLUSHING 02/14/2009   Hyperlipidemia LDL goal <70 01/30/2009   Essential hypertension 01/30/2009   CARDIAC ARRHYTHMIA 01/30/2009   Transient cerebral ischemia 01/30/2009    Past Medical History: Past Medical History:  Diagnosis Date   Abnormal echocardiogram 01/2009   Mild LVH, EF >55%, no regional wall motion abnormalities, normal RV, mild aortic insufficiency, no aortic stenosis.   Adnexal mass    Anxiety    Dyspnea    Heart murmur    Hemorrhoids    Hyperlipidemia    Hypertension    Edema with Norvasc. Unable to tolerate Lisinopril/ HCTZ.   Hypokalemia    Manifested by lip and finger tingling   Hypothyroidism    Palpitations    Event monitor 01/2009 with no significant arrhythmias   Sleep apnea    does not use cpap   TIA (transient ischemic attack) 01/2009   CT and MRI of head showed no evidence for stroke. Carotid dopplers showed no significant plaque   Transient cerebral ischemia    Tricuspid valve insufficiency    Ventral hernia     Past Surgical History: Past Surgical History:  Procedure Laterality Date   CRYOABLATION     Of uterus   CYSTOSCOPY N/A 08/04/2022   Procedure: CYSTOSCOPY;  Surgeon: Artelia Laroche, MD;  Location: [facility];  Service: Gynecology;  Laterality: N/A;   ROBOTIC ASSISTED TOTAL HYSTERECTOMY WITH BILATERAL SALPINGO OOPHERECTOMY N/A 08/04/2022   Procedure: XI ROBOTIC ASSISTED TOTAL HYSTERECTOMY WITH BILATERAL SALPINGO OOPHORECTOMY;  Surgeon: Artelia Laroche, MD;  Location: [facility];  Service: Gynecology;  Laterality: N/A;   UMBILICAL HERNIA REPAIR  2010   Dr Malissa Hippo   URETHRAL DILATION     removal of bladder polyps   VENTRAL HERNIA REPAIR N/A 08/04/2022   Procedure: HERNIA REPAIR VENTRAL ADULT;  Surgeon: Campbell Lerner, MD;  Location: [facility];  Service: General;  Laterality: N/A;    Past Gynecologic History:  Menarche: age 89 Post menopausal   OB History:  OB History  No obstetric history on file.  G2P2  Family History: Family History  Problem Relation Age of Onset   Hypertension Mother    Arrhythmia Mother        Atrial fibrillation   Hypertension Father    Valvular heart disease Father    Hypertension Brother    Lung cancer Maternal Uncle    Hypertension Paternal Grandmother    Coronary artery disease Neg Hx        Premature   Breast cancer Neg Hx    Colon cancer Neg Hx     Social History: Social History   Socioeconomic History   Marital status: Married  Spouse name: Not on file   Number of children: 2   Years of education: Not on file   Highest education level: Not on file  Occupational History   Occupation: Sports administrator    Comment: full time  Tobacco Use   Smoking status: Never    Passive exposure: Never   Smokeless tobacco: Never  Vaping Use   Vaping Use: Never used  Substance and Sexual Activity   Alcohol use: No    Alcohol/week: 0.0 standard drinks of alcohol   Drug use: No   Sexual activity: Yes    Birth control/protection: None  Other Topics Concern   Not on file  Social History Narrative   Married   Gets regular exercise   Social Determinants of Health   Financial Resource Strain: Not on file   Food Insecurity: Not on file  Transportation Needs: Not on file  Physical Activity: Not on file  Stress: Not on file  Social Connections: Not on file  Intimate Partner Violence: Not on file    Allergies: Allergies  Allergen Reactions   Vicodin [Hydrocodone-Acetaminophen] Anaphylaxis   Amlodipine Besylate    Cardizem [Diltiazem Hcl] Other (See Comments)    Flushing    Erythromycin Other (See Comments)    Chest heaviness    Hydrocodone-Acetaminophen    Levaquin [Levofloxacin In D5w] Other (See Comments)    Rapid heart beat   Lisinopril Other (See Comments)    Difficulty breathing and rash   Macrobid [Nitrofurantoin Macrocrystal] Other (See Comments)    Wheezing and headache   Oxycodone-Acetaminophen    Sulfonamide Derivatives Rash     Review of Systems General: no complaints  HEENT: no complaints  Lungs: no complaints  Cardiac: no complaints  GI: no complaints  GU: no complaints  Musculoskeletal: no complaints  Extremities: no complaints  Skin: no complaints  Neuro: no complaints  Endocrine: no complaints  Psych: no complaints        Objective:  Physical Examination:  BP (!) 134/54   Pulse 62   Wt 189 lb (85.7 kg)   BMI 32.44 kg/m     Performance status = 0  GENERAL: Patient is a well appearing female in no acute distress ABDOMEN:  Soft, nontender. Nondistended. No masses/ascites/hernia/or hepatomegaly.  EXTREMITIES:  No peripheral edema.   SKIN:  Incisions well healed NEURO:  Nonfocal. Well oriented.  Appropriate affect.  Pelvic: Chaperoned by CMA EGBUS: no lesions Cervix: surgically absent Vagina: no lesions, completely healed. Sutures intact. No discharge or erythema.  Uterus: surgically absent BME: no palpable masses.    Lab Review Labs on site today:  Lab Results  Component Value Date   WBC 10.8 (H) 08/04/2022   HGB 12.4 08/04/2022   HCT 37.2 08/04/2022   MCV 95.1 08/04/2022   PLT 221 08/04/2022     Chemistry      Component Value  Date/Time   NA 134 (L) 08/04/2022 1528   K 3.7 08/04/2022 1528   CL 103 08/04/2022 1528   CO2 24 08/04/2022 1528   BUN 13 08/04/2022 1528   CREATININE 0.75 08/04/2022 1528      Component Value Date/Time   CALCIUM 8.4 (L) 08/04/2022 1528   ALKPHOS 61 06/18/2022 0738   AST 13 06/18/2022 0738   ALT 12 06/18/2022 0738   BILITOT 0.4 06/18/2022 0738     Lab Results  Component Value Date   TSH 1.194 07/14/2022     Radiologic Imaging: 06/11/2022 Narrative & Impression  CLINICAL DATA:  Hernia suspected, abdominal wall increases postprandial and with exertion   EXAM: CT ABDOMEN AND PELVIS WITH CONTRAST   TECHNIQUE: Multidetector CT imaging of the abdomen and pelvis was performed using the standard protocol following bolus administration of intravenous contrast.   RADIATION DOSE REDUCTION: This exam was performed according to the departmental dose-optimization program which includes automated exposure control, adjustment of the mA and/or kV according to patient size and/or use of iterative reconstruction technique.   CONTRAST:  OMNIPAQUE IOHEXOL 300 MG/ML  SOLN   COMPARISON:  CT March 11, 2005   FINDINGS: Lower chest: Solid 3 mm subpleural left lower lobe pulmonary nodule on image 6/3 stable dating back to March 11, 2005 compatible with a benign finding.   Hepatobiliary: No suspicious hepatic lesion. Gallbladder is unremarkable. No biliary ductal dilation.   Pancreas: No pancreatic ductal dilation or evidence of acute inflammation.   Spleen: No splenomegaly.   Adrenals/Urinary Tract: Bilateral adrenal glands appear normal. Mild prominence of the right collecting system and ureter to the level of a large cystic lesion in the right adnexa left kidney is unremarkable. Urinary bladder is unremarkable for degree of distension.   Stomach/Bowel: Stomach is unremarkable for degree of distension. No pathologic dilation of small or large bowel. Left-sided  colonic diverticulosis without findings of acute diverticulitis.   Vascular/Lymphatic: Aortic atherosclerosis. No pathologically enlarged abdominal pelvic lymph nodes.   Reproductive: Large cystic lesion in the right adnexa measures 14.5 x 9.1 cm on image 64/2 with a associated fluid-filled serpiginous tubular structure. Possible focal fluid in the fundal portion of the endometrial canal. Left adnexa is unremarkable.   Other: Large fat containing ventral hernia with a 2 cm aperture width.   Musculoskeletal: Multilevel degenerative changes spine. Degenerative change of the bilateral hips.   IMPRESSION: 1. Large cystic lesion in the right adnexa measuring 14.5 x 9.1 cm with a associated fluid-filled serpiginous tubular structure. Findings are favored to reflect a large right ovarian cystic lesion with hydrosalpinx. Suggest Ob-gyn consult and further evaluation by pelvic MRI with and without contrast. 2. Mild prominence of the right collecting system and ureter to the level of the large cystic lesion in the right adnexa but with symmetric enhancement and excretion of contrast indicative of a minimally obstructive process. 3. Large fat containing ventral hernia with a 2 cm aperture width. 4.  Aortic Atherosclerosis (ICD10-I70.0).   These results will be called to the ordering clinician or representative by the Radiologist Assistant, and communication documented in the PACS or [employer].     Electronically Signed   By: Maudry Mayhew M.D.   On: 06/12/2022 13:28      Assessment:  Margaret Mercado is a 67 y.o. female diagnosed with stage IIIA endometrioid grade 1 endometrial cancer (pMMR, p53wt, ER/PR+)  with positive cytology.   Benign ovarian cyst  Symptomatic nonreducible hernia s/p prior hernia repair; no evidence of recurrent hernia on exam  Hypothyroidism, on replacement, asymptomatic  Medical co-morbidities complicating care: Body mass index is 32.44 kg/m. Mild  LVH, HTN, h/o TIA 2010  Plan:   Problem List Items Addressed This Visit       Genitourinary   Endometrial cancer - Primary (Chronic)    She will follow-up in 6 weeks with repeat tumor markers and we can discuss imaging.   We had long discussions previously and I strongly recommended adjuvant therapy. She is doing a "treatment". The tumors markers and close surveillance with imaging will also inform if this treatment is adequate.  She will contact us if she has any concerns.    The patient's diagnosis, an outline of the further diagnostic and laboratory studies which will be required, the recommendation for surgery, and alternatives were discussed with her and her accompanying family members.  All questions were answered to their satisfaction.  A total of 10 minutes were spent with the patient/family today; >50% was spent in education, counseling and coordination of care for stage III endometrial cancer.   Evelena Asa, MD

## 2022-10-15 ENCOUNTER — Ambulatory Visit: Payer: 59 | Admitting: Internal Medicine

## 2022-10-27 ENCOUNTER — Other Ambulatory Visit: Payer: Self-pay

## 2022-10-27 MED FILL — Sertraline HCl Tab 50 MG: ORAL | 30 days supply | Qty: 30 | Fill #3 | Status: AC

## 2022-11-02 ENCOUNTER — Other Ambulatory Visit: Payer: Self-pay

## 2022-11-12 ENCOUNTER — Other Ambulatory Visit: Payer: Self-pay

## 2022-11-15 ENCOUNTER — Inpatient Hospital Stay: Payer: 59 | Attending: Obstetrics and Gynecology

## 2022-11-15 DIAGNOSIS — R978 Other abnormal tumor markers: Secondary | ICD-10-CM | POA: Insufficient documentation

## 2022-11-15 DIAGNOSIS — C541 Malignant neoplasm of endometrium: Secondary | ICD-10-CM | POA: Diagnosis not present

## 2022-11-15 DIAGNOSIS — R1900 Intra-abdominal and pelvic swelling, mass and lump, unspecified site: Secondary | ICD-10-CM | POA: Diagnosis not present

## 2022-11-15 DIAGNOSIS — N898 Other specified noninflammatory disorders of vagina: Secondary | ICD-10-CM | POA: Diagnosis not present

## 2022-11-16 LAB — CA 125: Cancer Antigen (CA) 125: 11.6 U/mL (ref 0.0–38.1)

## 2022-11-17 ENCOUNTER — Inpatient Hospital Stay (HOSPITAL_BASED_OUTPATIENT_CLINIC_OR_DEPARTMENT_OTHER): Payer: 59 | Admitting: Obstetrics and Gynecology

## 2022-11-17 ENCOUNTER — Encounter: Payer: Self-pay | Admitting: Obstetrics and Gynecology

## 2022-11-17 VITALS — BP 126/54 | HR 62 | Temp 96.6°F | Wt 187.9 lb

## 2022-11-17 DIAGNOSIS — C541 Malignant neoplasm of endometrium: Secondary | ICD-10-CM

## 2022-11-17 DIAGNOSIS — N898 Other specified noninflammatory disorders of vagina: Secondary | ICD-10-CM | POA: Diagnosis not present

## 2022-11-17 DIAGNOSIS — R1900 Intra-abdominal and pelvic swelling, mass and lump, unspecified site: Secondary | ICD-10-CM | POA: Diagnosis not present

## 2022-11-17 DIAGNOSIS — R978 Other abnormal tumor markers: Secondary | ICD-10-CM | POA: Diagnosis not present

## 2022-11-17 NOTE — Progress Notes (Signed)
Gynecologic Oncology Interval Visit   Referring Provider: Dr. Dalbert Garnet  Chief Complaint: endometrial cancer  Subjective:  Margaret Mercado is a 67 y.o. female who is seen in consultation from Dr. Dalbert Garnet for adnexal mass and elevated ca 125.   She presents today for her surveillance visit. She saw Dr. Cathie Hoops on 08/23/2022 and was counseled about chemotherapy. She has opted not to proceed with chemotherapy. She did not see Radiation Oncology. She is doing an alternative treatment and would like to have the blood based tumor markers repeated. Her CA125 on 11/15/2022 was 11.6.   Gynecologic Oncology History  Margaret Mercado is a pleasant female who is seen in consultation from Dr. Dalbert Garnet for adnexal mass and elevated ca 125.   Patient was initially seen by PCP for abdominal pain that was thought to be related to a known hernia. 06/11/22 CT showed adnexal mass on right measuring 14.5 x 9.1 cm with fluid filled serpiginous tubular structure. Possible focal fluid in the fundal portion of the endometrial canal. Large fat containing ventral hernia with a 2cm aperture width.  06/11/2022 CT A/P IMPRESSION: 1. Large cystic lesion in the right adnexa measuring 14.5 x 9.1 cm with a associated fluid-filled serpiginous tubular structure. Findings are favored to reflect a large right ovarian cystic lesion with hydrosalpinx. Suggest Ob-gyn consult and further evaluation by pelvic MRI with and without contrast. 2. Mild prominence of the right collecting system and ureter to the level of the large cystic lesion in the right adnexa but with symmetric enhancement and excretion of contrast indicative of a minimally obstructive process. 3. Large fat containing ventral hernia with a 2 cm aperture width. 4.  Aortic Atherosclerosis (ICD10-I70.0).   Lower chest: Solid 3 mm subpleural left lower lobe pulmonary nodule on image 6/3 stable dating back to March 11, 2005 compatible with a benign finding. Vascular/Lymphatic:  Aortic atherosclerosis. No pathologically enlarged abdominal pelvic lymph nodes.  She was seen by Dr. Dalbert Garnet.   06/18/22- CA 125: 298.  HbA1c - 6.1  07/07/22- US Pelvis Uterus: Measurements: 8.6 x 5.4 x 5.8 cm = volume: 140 mL Endometrium: 10.3 mm.  Right ovary: not visualized. 16.1 x 9.9 x 9.3 cm cystic right adnexal mass incompletely visualized Left ovary: not visualized No abnormal free fluid.   CXR 07/14/2022  IMPRESSION: No active cardiopulmonary disease EKG 07/15/2022 EKG - SR/SB with no acute ischemic changes. Patient notified of results by Dr. Dale [place].   07/20/2022 She saw Dr. Claudine Mouton and he discussed hernia repair at the time of surgery.   08/04/2022 Exam under anesthesia, Robotic assisted total hysterectomy and bilateral salpingo-oophorectomy with extensive adhesiolysis including enterolysis and ovariolysis, right ureterolysis, and bilateral pelvic node dissection/hernia repair   Pathology:  ENDOMETRIOID ADENOCARCINOMA, FIGO GRADE 1; involvement with MYOMETRIUM (20/20 mm), UTERINE SEROSA:RIGHT FALLOPIAN TUBE WITH INVOLVEMENT BY ENDOMETRIOID ADENOCARCINOMA, FAVOR DIRECT EXTENSION FROM UTERINE CORNU. OMENTUM AND BILATERAL PELVIC NODES NEGATIVE FOR MALIGNANCY (0/10, 0/7).  ER (70-80%, moderate), PR (greater than 95%, strong), wild-type (non-mutated) expression of p53.  MLH1: Intact nuclear expression  MSH2: Intact nuclear expression  MSH6: Intact nuclear expression  PMS2: Intact nuclear expression  IHC Interpretation: No loss of nuclear expression of MMR proteins: Low  probability of MSI-H.   A. PELVIC WASHINGS; LIQUID-BASED PREPARATION:  - MALIGNANT CELLS PRESENT.  - COMPATIBLE WITH METASTATIC ADENOCARCINOMA OF GYNECOLOGIC ORIGIN (SEE  [phone]).   She opted for no chemotherapy or radiation therapy at this time.   Problem List: Patient Active Problem List   Diagnosis  Date Noted   Endometrial cancer (HCC) 08/23/2022   Goals of care, counseling/discussion  08/23/2022   Complex ovarian cyst 08/04/2022   Adnexal mass 07/18/2022   Rash 02/21/2022   Hemorrhoids 02/21/2022   Encounter for completion of form with patient 09/24/2021   Headache 09/23/2021   Expressive aphasia 09/13/2021   Breast cancer screening 02/08/2021   Hematoma 01/18/2021   Leg pain 01/18/2021   Type 2 diabetes mellitus with hyperglycemia (HCC) 05/27/2020   Tricuspid valve insufficiency 03/28/2019   Heart murmur 11/08/2018   History of TIA (transient ischemic attack) 11/08/2018   Abnormal mammogram of right breast 05/03/2018   DOE (dyspnea on exertion) 12/13/2017   Stress 12/13/2017   Hyperglycemia 12/12/2017   Healthcare maintenance 05/17/2016   Abdominal pain, left lower quadrant 08/19/2013   Ventral hernia 08/19/2013   Pyuria 08/19/2013   Sleep apnea 06/17/2012   FLUSHING 02/14/2009   Hyperlipidemia LDL goal <70 01/30/2009   Essential hypertension 01/30/2009   CARDIAC ARRHYTHMIA 01/30/2009   Transient cerebral ischemia 01/30/2009    Past Medical History: Past Medical History:  Diagnosis Date   Abnormal echocardiogram 01/2009   Mild LVH, EF >55%, no regional wall motion abnormalities, normal RV, mild aortic insufficiency, no aortic stenosis.   Adnexal mass    Anxiety    Dyspnea    Heart murmur    Hemorrhoids    Hyperlipidemia    Hypertension    Edema with Norvasc. Unable to tolerate Lisinopril/ HCTZ.   Hypokalemia    Manifested by lip and finger tingling   Hypothyroidism    Palpitations    Event monitor 01/2009 with no significant arrhythmias   Sleep apnea    does not use cpap   TIA (transient ischemic attack) 01/2009   CT and MRI of head showed no evidence for stroke. Carotid dopplers showed no significant plaque   Transient cerebral ischemia    Tricuspid valve insufficiency    Ventral hernia     Past Surgical History: Past Surgical History:  Procedure Laterality Date   CRYOABLATION     Of uterus   CYSTOSCOPY N/A 08/04/2022   Procedure:  CYSTOSCOPY;  Surgeon: Artelia Laroche, MD;  Location: [facility];  Service: Gynecology;  Laterality: N/A;   ROBOTIC ASSISTED TOTAL HYSTERECTOMY WITH BILATERAL SALPINGO OOPHERECTOMY N/A 08/04/2022   Procedure: XI ROBOTIC ASSISTED TOTAL HYSTERECTOMY WITH BILATERAL SALPINGO OOPHORECTOMY;  Surgeon: Artelia Laroche, MD;  Location: [facility];  Service: Gynecology;  Laterality: N/A;   UMBILICAL HERNIA REPAIR  2010   Dr Malissa Hippo   URETHRAL DILATION     removal of bladder polyps   VENTRAL HERNIA REPAIR N/A 08/04/2022   Procedure: HERNIA REPAIR VENTRAL ADULT;  Surgeon: Campbell Lerner, MD;  Location: [facility];  Service: General;  Laterality: N/A;    Past Gynecologic History:  Menarche: age 63 Post menopausal   OB History:  OB History  No obstetric history on file.  G2P2  Family History: Family History  Problem Relation Age of Onset   Hypertension Mother    Arrhythmia Mother        Atrial fibrillation   Hypertension Father    Valvular heart disease Father    Hypertension Brother    Lung cancer Maternal Uncle    Hypertension Paternal Grandmother    Coronary artery disease Neg Hx        Premature   Breast cancer Neg Hx    Colon cancer Neg Hx     Social History: Social History   Socioeconomic  History   Marital status: Married    Spouse name: Not on file   Number of children: 2   Years of education: Not on file   Highest education level: Not on file  Occupational History   Occupation: Sports administrator    Comment: full time  Tobacco Use   Smoking status: Never    Passive exposure: Never   Smokeless tobacco: Never  Vaping Use   Vaping Use: Never used  Substance and Sexual Activity   Alcohol use: No    Alcohol/week: 0.0 standard drinks of alcohol   Drug use: No   Sexual activity: Yes    Birth control/protection: None  Other Topics Concern   Not on file  Social History Narrative   Married   Gets regular exercise   Social Determinants of Health    Financial Resource Strain: Not on file  Food Insecurity: Not on file  Transportation Needs: Not on file  Physical Activity: Not on file  Stress: Not on file  Social Connections: Not on file  Intimate Partner Violence: Not on file    Allergies: Allergies  Allergen Reactions   Vicodin [Hydrocodone-Acetaminophen] Anaphylaxis   Amlodipine Besylate    Cardizem [Diltiazem Hcl] Other (See Comments)    Flushing    Erythromycin Other (See Comments)    Chest heaviness    Hydrocodone-Acetaminophen    Levaquin [Levofloxacin In D5w] Other (See Comments)    Rapid heart beat   Lisinopril Other (See Comments)    Difficulty breathing and rash   Macrobid [Nitrofurantoin Macrocrystal] Other (See Comments)    Wheezing and headache   Oxycodone-Acetaminophen    Sulfonamide Derivatives Rash     Review of Systems General: no complaints  HEENT: no complaints  Lungs: no complaints  Cardiac: no complaints  GI: no complaints  GU: no complaints  Musculoskeletal: no complaints  Extremities: no complaints  Skin: no complaints  Neuro: no complaints  Endocrine: no complaints  Psych: no complaints         Objective:  Physical Examination:  BP (!) 126/54   Pulse 62   Temp (!) 96.6 F (35.9 C)   Wt 187 lb 14.4 oz (85.2 kg)   BMI 32.25 kg/m   Performance status:    GENERAL: Patient is a well appearing female in no acute distress HEENT:  Atraumatic and normocephalic.  NODES:  No cervical, supraclavicular, axillary, or inguinal lymphadenopathy palpated.  LUNGS:  normal respiratory effort ABDOMEN:  Soft, nontender. Nondistended. No masses/ascites/hernia/or hepatomegaly.  EXTREMITIES:  No peripheral edema.   NEURO:  Nonfocal. Well oriented.  Appropriate affect.  Pelvic: EGBUS: no lesions Cervix: surgically absent Vagina: 5 mm erythematous lesion at vaginal cuff midline with small amount of  bleeding; no discharge Uterus: surgically absent BME: no palpable masses Rectovaginal:  deferred   Procedure Note:  Informed Consent obtained. Time out performed. Area cleansed with Betadine. Biopsy performed at vaginal cuff. Hemostasis excellent with AgNO3. Patient reassessed after procedure and in stable condition. No complications.   Lab Review Labs on site today: n/a    Chemistry     Radiologic Imaging: 06/11/2022 Narrative & Impression  CLINICAL DATA:  Hernia suspected, abdominal wall increases postprandial and with exertion   EXAM: CT ABDOMEN AND PELVIS WITH CONTRAST   TECHNIQUE: Multidetector CT imaging of the abdomen and pelvis was performed using the standard protocol following bolus administration of intravenous contrast.   RADIATION DOSE REDUCTION: This exam was performed according to the departmental dose-optimization program which includes automated exposure  control, adjustment of the mA and/or kV according to patient size and/or use of iterative reconstruction technique.   CONTRAST:  OMNIPAQUE IOHEXOL 300 MG/ML  SOLN   COMPARISON:  CT March 11, 2005   FINDINGS: Lower chest: Solid 3 mm subpleural left lower lobe pulmonary nodule on image 6/3 stable dating back to March 11, 2005 compatible with a benign finding.   Hepatobiliary: No suspicious hepatic lesion. Gallbladder is unremarkable. No biliary ductal dilation.   Pancreas: No pancreatic ductal dilation or evidence of acute inflammation.   Spleen: No splenomegaly.   Adrenals/Urinary Tract: Bilateral adrenal glands appear normal. Mild prominence of the right collecting system and ureter to the level of a large cystic lesion in the right adnexa left kidney is unremarkable. Urinary bladder is unremarkable for degree of distension.   Stomach/Bowel: Stomach is unremarkable for degree of distension. No pathologic dilation of small or large bowel. Left-sided colonic diverticulosis without findings of acute diverticulitis.   Vascular/Lymphatic: Aortic atherosclerosis. No  pathologically enlarged abdominal pelvic lymph nodes.   Reproductive: Large cystic lesion in the right adnexa measures 14.5 x 9.1 cm on image 64/2 with a associated fluid-filled serpiginous tubular structure. Possible focal fluid in the fundal portion of the endometrial canal. Left adnexa is unremarkable.   Other: Large fat containing ventral hernia with a 2 cm aperture width.   Musculoskeletal: Multilevel degenerative changes spine. Degenerative change of the bilateral hips.   IMPRESSION: 1. Large cystic lesion in the right adnexa measuring 14.5 x 9.1 cm with a associated fluid-filled serpiginous tubular structure. Findings are favored to reflect a large right ovarian cystic lesion with hydrosalpinx. Suggest Ob-gyn consult and further evaluation by pelvic MRI with and without contrast. 2. Mild prominence of the right collecting system and ureter to the level of the large cystic lesion in the right adnexa but with symmetric enhancement and excretion of contrast indicative of a minimally obstructive process. 3. Large fat containing ventral hernia with a 2 cm aperture width. 4.  Aortic Atherosclerosis (ICD10-I70.0).   These results will be called to the ordering clinician or representative by the Radiologist Assistant, and communication documented in the PACS or [employer].     Electronically Signed   By: Maudry Mayhew M.D.   On: 06/12/2022 13:28      Assessment:  Margaret Mercado is a 67 y.o. female diagnosed with stage IIIA endometrioid grade 1 endometrial cancer (pMMR, p53wt, ER/PR+)  with positive cytology. Declined therapy; vaginal lesion likely granulation tissue.  Benign ovarian cyst  Symptomatic nonreducible hernia s/p prior hernia repair; no evidence of recurrent hernia on exam  Hypothyroidism, on replacement, asymptomatic  Medical co-morbidities complicating care: Body mass index is 32.25 kg/m. Mild LVH, HTN, h/o TIA 2010  Plan:   Problem List Items  Addressed This Visit       Genitourinary   Endometrial cancer (HCC) - Primary (Chronic)   Other Visit Diagnoses     Vaginal lesion            She will follow-up in 1 week to check vaginal cuff. Follow up pathology. She may need another treatment with AgNO3. CT C/A/P imaging ordered.   The patient's diagnosis, an outline of the further diagnostic and laboratory studies which will be required, the recommendation for surgery, and alternatives were discussed with her and her accompanying family members.  All questions were answered to their satisfaction.  A total of 15 minutes were spent with the patient/family today; >50% was spent in education, counseling  and coordination of care for stage III endometrial cancer.   Evelena Asa, MD

## 2022-11-19 ENCOUNTER — Other Ambulatory Visit: Payer: Self-pay

## 2022-11-19 ENCOUNTER — Ambulatory Visit (INDEPENDENT_AMBULATORY_CARE_PROVIDER_SITE_OTHER): Payer: 59 | Admitting: Internal Medicine

## 2022-11-19 ENCOUNTER — Encounter: Payer: Self-pay | Admitting: Internal Medicine

## 2022-11-19 VITALS — BP 134/76 | HR 66 | Temp 97.9°F | Resp 16 | Ht 61.0 in | Wt 187.0 lb

## 2022-11-19 DIAGNOSIS — E785 Hyperlipidemia, unspecified: Secondary | ICD-10-CM

## 2022-11-19 DIAGNOSIS — Z1231 Encounter for screening mammogram for malignant neoplasm of breast: Secondary | ICD-10-CM | POA: Diagnosis not present

## 2022-11-19 DIAGNOSIS — F439 Reaction to severe stress, unspecified: Secondary | ICD-10-CM

## 2022-11-19 DIAGNOSIS — C541 Malignant neoplasm of endometrium: Secondary | ICD-10-CM

## 2022-11-19 DIAGNOSIS — E559 Vitamin D deficiency, unspecified: Secondary | ICD-10-CM | POA: Diagnosis not present

## 2022-11-19 DIAGNOSIS — E1165 Type 2 diabetes mellitus with hyperglycemia: Secondary | ICD-10-CM | POA: Diagnosis not present

## 2022-11-19 DIAGNOSIS — R739 Hyperglycemia, unspecified: Secondary | ICD-10-CM

## 2022-11-19 DIAGNOSIS — I10 Essential (primary) hypertension: Secondary | ICD-10-CM

## 2022-11-19 DIAGNOSIS — G479 Sleep disorder, unspecified: Secondary | ICD-10-CM

## 2022-11-19 LAB — BASIC METABOLIC PANEL
BUN: 17 mg/dL (ref 6–23)
CO2: 30 mEq/L (ref 19–32)
Calcium: 9.5 mg/dL (ref 8.4–10.5)
Chloride: 101 mEq/L (ref 96–112)
Creatinine, Ser: 0.77 mg/dL (ref 0.40–1.20)
GFR: 80.88 mL/min (ref >60.00)
Glucose, Bld: 123 mg/dL — ABNORMAL HIGH (ref 70–99)
Potassium: 4 mEq/L (ref 3.5–5.1)
Sodium: 140 mEq/L (ref 135–145)

## 2022-11-19 LAB — LIPID PANEL
Cholesterol: 205 mg/dL — ABNORMAL HIGH (ref 0–200)
HDL: 45.9 mg/dL (ref >39.00)
LDL Cholesterol: 124 mg/dL — ABNORMAL HIGH (ref 0–99)
NonHDL: 159.15
Total CHOL/HDL Ratio: 4
Triglycerides: 174 mg/dL — ABNORMAL HIGH (ref 0.0–149.0)
VLDL: 34.8 mg/dL (ref 0.0–40.0)

## 2022-11-19 LAB — CBC WITH DIFFERENTIAL/PLATELET
Basophils Absolute: 0.1 10*3/uL (ref 0.0–0.1)
Basophils Relative: 1.2 % (ref 0.0–3.0)
Eosinophils Absolute: 0.2 10*3/uL (ref 0.0–0.7)
Eosinophils Relative: 3.4 % (ref 0.0–5.0)
HCT: 39.9 % (ref 36.0–46.0)
Hemoglobin: 13.3 g/dL (ref 12.0–15.0)
Lymphocytes Relative: 24.5 % (ref 12.0–46.0)
Lymphs Abs: 1.5 10*3/uL (ref 0.7–4.0)
MCHC: 33.3 g/dL (ref 30.0–36.0)
MCV: 94.3 fl (ref 78.0–100.0)
Monocytes Absolute: 0.4 10*3/uL (ref 0.1–1.0)
Monocytes Relative: 7.1 % (ref 3.0–12.0)
Neutro Abs: 4 10*3/uL (ref 1.4–7.7)
Neutrophils Relative %: 63.8 % (ref 43.0–77.0)
Platelets: 248 10*3/uL (ref 150.0–400.0)
RBC: 4.23 Mil/uL (ref 3.87–5.11)
RDW: 13.2 % (ref 11.5–15.5)
WBC: 6.2 10*3/uL (ref 4.0–10.5)

## 2022-11-19 LAB — HEPATIC FUNCTION PANEL
ALT: 10 U/L (ref 0–35)
AST: 14 U/L (ref 0–37)
Albumin: 4.2 g/dL (ref 3.5–5.2)
Alkaline Phosphatase: 49 U/L (ref 39–117)
Bilirubin, Direct: 0.1 mg/dL (ref 0.0–0.3)
Total Bilirubin: 0.3 mg/dL (ref 0.2–1.2)
Total Protein: 7 g/dL (ref 6.0–8.3)

## 2022-11-19 LAB — MICROALBUMIN / CREATININE URINE RATIO
Creatinine,U: 70.5 mg/dL
Microalb Creat Ratio: 1 mg/g (ref 0.0–30.0)
Microalb, Ur: <0.7 mg/dL (ref 0.0–1.9)

## 2022-11-19 LAB — HEMOGLOBIN A1C: Hgb A1c MFr Bld: 5.8 % (ref 4.6–6.5)

## 2022-11-19 LAB — TSH: TSH: 2.85 u[IU]/mL (ref 0.35–5.50)

## 2022-11-19 LAB — VITAMIN D 25 HYDROXY (VIT D DEFICIENCY, FRACTURES): VITD: 26.19 ng/mL — ABNORMAL LOW (ref 30.00–100.00)

## 2022-11-19 MED ORDER — ROSUVASTATIN CALCIUM 10 MG PO TABS
10.0000 mg | ORAL_TABLET | Freq: Every day | ORAL | 0 refills | Status: DC
  Filled 2022-11-19: qty 90, 90d supply, fill #0

## 2022-11-19 MED ORDER — TRAZODONE HCL 50 MG PO TABS
25.0000 mg | ORAL_TABLET | Freq: Every evening | ORAL | 1 refills | Status: DC | PRN
  Filled 2022-11-19: qty 30, 30d supply, fill #0

## 2022-11-19 MED ORDER — SERTRALINE HCL 50 MG PO TABS
50.0000 mg | ORAL_TABLET | Freq: Every day | ORAL | 3 refills | Status: DC
  Filled 2022-11-19 – 2022-12-14 (×2): qty 30, 30d supply, fill #0
  Filled 2023-03-15: qty 30, 30d supply, fill #1
  Filled 2023-06-21: qty 30, 30d supply, fill #2
  Filled 2023-10-11: qty 30, 30d supply, fill #3

## 2022-11-19 MED ORDER — HYDROCHLOROTHIAZIDE 12.5 MG PO CAPS
12.5000 mg | ORAL_CAPSULE | Freq: Every day | ORAL | 1 refills | Status: DC
  Filled 2022-11-19: qty 30, 30d supply, fill #0

## 2022-11-19 MED ORDER — NEBIVOLOL HCL 2.5 MG PO TABS
1.2500 mg | ORAL_TABLET | Freq: Every day | ORAL | 1 refills | Status: DC
  Filled 2022-11-19 – 2023-03-15 (×3): qty 45, 90d supply, fill #0
  Filled 2023-04-26: qty 45, 90d supply, fill #1

## 2022-11-19 NOTE — Progress Notes (Unsigned)
Subjective:    Patient ID: Margaret Mercado, female    DOB: [birth year], 67 y.o.   MRN: [id number]  Patient here for  Chief Complaint  Patient presents with   Medical Management of Chronic Issues    HPI Here for a scheduled follow up.  Recently diagnosed with endometrial cancer.  She has opted not to proceed with chemotherapy.  Did not f/u with radiation oncology.  She is doing an alternative treatment.   Previous issues with loose stool. Bowels are better.   Past Medical History:  Diagnosis Date   Abnormal echocardiogram 01/2009   Mild LVH, EF >55%, no regional wall motion abnormalities, normal RV, mild aortic insufficiency, no aortic stenosis.   Adnexal mass    Anxiety    Dyspnea    Heart murmur    Hemorrhoids    Hyperlipidemia    Hypertension    Edema with Norvasc. Unable to tolerate Lisinopril/ HCTZ.   Hypokalemia    Manifested by lip and finger tingling   Hypothyroidism    Palpitations    Event monitor 01/2009 with no significant arrhythmias   Sleep apnea    does not use cpap   TIA (transient ischemic attack) 01/2009   CT and MRI of head showed no evidence for stroke. Carotid dopplers showed no significant plaque   Transient cerebral ischemia    Tricuspid valve insufficiency    Ventral hernia    Past Surgical History:  Procedure Laterality Date   CRYOABLATION     Of uterus   CYSTOSCOPY N/A 08/04/2022   Procedure: CYSTOSCOPY;  Surgeon: Artelia Laroche, MD;  Location: [facility];  Service: Gynecology;  Laterality: N/A;   ROBOTIC ASSISTED TOTAL HYSTERECTOMY WITH BILATERAL SALPINGO OOPHERECTOMY N/A 08/04/2022   Procedure: XI ROBOTIC ASSISTED TOTAL HYSTERECTOMY WITH BILATERAL SALPINGO OOPHORECTOMY;  Surgeon: Artelia Laroche, MD;  Location: [facility];  Service: Gynecology;  Laterality: N/A;   UMBILICAL HERNIA REPAIR  2010   Dr Malissa Hippo   URETHRAL DILATION     removal of bladder polyps   VENTRAL HERNIA REPAIR N/A 08/04/2022   Procedure: HERNIA REPAIR  VENTRAL ADULT;  Surgeon: Campbell Lerner, MD;  Location: [facility];  Service: General;  Laterality: N/A;   Family History  Problem Relation Age of Onset   Hypertension Mother    Arrhythmia Mother        Atrial fibrillation   Hypertension Father    Valvular heart disease Father    Hypertension Brother    Lung cancer Maternal Uncle    Hypertension Paternal Grandmother    Coronary artery disease Neg Hx        Premature   Breast cancer Neg Hx    Colon cancer Neg Hx    Social History   Socioeconomic History   Marital status: Married    Spouse name: Not on file   Number of children: 2   Years of education: Not on file   Highest education level: Not on file  Occupational History   Occupation: Sports administrator    Comment: full time  Tobacco Use   Smoking status: Never    Passive exposure: Never   Smokeless tobacco: Never  Vaping Use   Vaping Use: Never used  Substance and Sexual Activity   Alcohol use: No    Alcohol/week: 0.0 standard drinks of alcohol   Drug use: No   Sexual activity: Yes    Birth control/protection: None  Other Topics Concern   Not on file  Social History  Narrative   Married   Gets regular exercise   Social Determinants of Health   Financial Resource Strain: Not on file  Food Insecurity: Not on file  Transportation Needs: Not on file  Physical Activity: Not on file  Stress: Not on file  Social Connections: Not on file     Review of Systems     Objective:     BP 134/76   Pulse 66   Temp 97.9 F (36.6 C)   Resp 16   Ht 5\' 1"  (1.549 m)   Wt 187 lb (84.8 kg)   SpO2 98%   BMI 35.33 kg/m  Wt Readings from Last 3 Encounters:  11/19/22 187 lb (84.8 kg)  11/17/22 187 lb 14.4 oz (85.2 kg)  10/06/22 189 lb (85.7 kg)    Physical Exam   Outpatient Encounter Medications as of 11/19/2022  Medication Sig   traZODone (DESYREL) 50 MG tablet Take 0.5-1 tablets (25-50 mg total) by mouth at bedtime as needed for sleep.   aspirin 81 MG EC  tablet Take 81 mg by mouth daily.     docusate sodium (COLACE) 100 MG capsule Take 1 capsule (100 mg total) by mouth 2 (two) times daily. To keep stools soft   hydrochlorothiazide (MICROZIDE) 12.5 MG capsule Take 1 capsule (12.5 mg total) by mouth daily.   HYDROmorphone (DILAUDID) 2 MG tablet Take 0.5 tablets (1 mg total) by mouth every 6 (six) hours as needed for severe pain.   levothyroxine (SYNTHROID) 50 MCG tablet Take 1 tablet (50 mcg total) by mouth daily. (Patient taking differently: Take 50 mcg by mouth daily before breakfast.)   nebivolol (BYSTOLIC) 2.5 MG tablet Take 0.5 tablets (1.25 mg total) by mouth daily.   rosuvastatin (CRESTOR) 10 MG tablet Take 1 tablet (10 mg total) by mouth daily.   sertraline (ZOLOFT) 50 MG tablet Take 1 tablet (50 mg total) by mouth daily.   [DISCONTINUED] hydrochlorothiazide (MICROZIDE) 12.5 MG capsule Take 1 capsule (12.5 mg total) by mouth daily.   [DISCONTINUED] nebivolol (BYSTOLIC) 2.5 MG tablet Take 0.5 tablets (1.25 mg total) by mouth daily.   [DISCONTINUED] rosuvastatin (CRESTOR) 10 MG tablet Take 1 tablet (10 mg total) by mouth daily.   [DISCONTINUED] sertraline (ZOLOFT) 50 MG tablet Take 1 tablet (50 mg total) by mouth daily. (Patient taking differently: Take 50 mg by mouth every morning.)   No facility-administered encounter medications on file as of 11/19/2022.     Lab Results  Component Value Date   WBC 10.8 (H) 08/04/2022   HGB 12.4 08/04/2022   HCT 37.2 08/04/2022   PLT 221 08/04/2022   GLUCOSE 188 (H) 08/04/2022   CHOL 256 (H) 06/18/2022   TRIG 280.0 (H) 06/18/2022   HDL 41.60 06/18/2022   LDLDIRECT 160.0 06/18/2022   LDLCALC 43 04/25/2020   ALT 12 06/18/2022   AST 13 06/18/2022   NA 134 (L) 08/04/2022   K 3.7 08/04/2022   CL 103 08/04/2022   CREATININE 0.75 08/04/2022   BUN 13 08/04/2022   CO2 24 08/04/2022   TSH 1.194 07/14/2022   INR 1.0 02/26/2021   HGBA1C 6.1 06/18/2022   MICROALBUR 0.7 09/23/2021    No results  found.     Assessment & Plan:  Essential hypertension  Hyperlipidemia LDL goal <70 -     CBC with Differential/Platelet -     Hepatic function panel -     TSH -     Lipid panel  Hyperglycemia  Type 2 diabetes mellitus with  hyperglycemia, without long-term current use of insulin (HCC) -     Basic metabolic panel -     Hemoglobin A1c -     Microalbumin / creatinine urine ratio  Visit for screening mammogram -     3D Screening Mammogram, Left and Right; Future  Vitamin D deficiency -     VITAMIN D 25 Hydroxy (Vit-D Deficiency, Fractures)  Other orders -     hydroCHLOROthiazide; Take 1 capsule (12.5 mg total) by mouth daily.  Dispense: 30 capsule; Refill: 1 -     Nebivolol HCl; Take 0.5 tablets (1.25 mg total) by mouth daily.  Dispense: 45 tablet; Refill: 1 -     Rosuvastatin Calcium; Take 1 tablet (10 mg total) by mouth daily.  Dispense: 90 tablet; Refill: 0 -     Sertraline HCl; Take 1 tablet (50 mg total) by mouth daily.  Dispense: 30 tablet; Refill: 3 -     traZODone HCl; Take 0.5-1 tablets (25-50 mg total) by mouth at bedtime as needed for sleep.  Dispense: 30 tablet; Refill: 1     Dale Minoa, MD

## 2022-11-20 ENCOUNTER — Encounter: Payer: Self-pay | Admitting: Internal Medicine

## 2022-11-20 DIAGNOSIS — G479 Sleep disorder, unspecified: Secondary | ICD-10-CM | POA: Insufficient documentation

## 2022-11-20 NOTE — Assessment & Plan Note (Signed)
Low carb diet and exercise.  Follow met b and a1c.   

## 2022-11-20 NOTE — Assessment & Plan Note (Signed)
Taking bystolic. Follow pressures and pulse. Blood pressure as outlined.

## 2022-11-20 NOTE — Assessment & Plan Note (Signed)
Check vitamin D level today 

## 2022-11-20 NOTE — Assessment & Plan Note (Signed)
Overall appears to be handling things relatively well.  Follow.   

## 2022-11-20 NOTE — Assessment & Plan Note (Signed)
Recently diagnosed with endometrial cancer.  She has opted not to proceed with chemotherapy or radiation therapy.   She is doing an alternative treatment.  She is following an alkaline diet and increased juice and carrots. Etc.  States she feels good.  Followed by oncology - Dr Cathie Hoops and Dr Sonia Side.  Discussed.  Elects to continue her current plans for now.  Recent CA 125-  11.6.

## 2022-11-20 NOTE — Assessment & Plan Note (Signed)
Having sleep issues as outlined.  Taking unisom 2-3 per night. Discussed treatment options.  Melatonin did not help.  Discussed trial of trazodone.  Is not to take unisom with trazodone.  Follow.

## 2022-11-20 NOTE — Assessment & Plan Note (Signed)
Crestor. Low cholesterol diet and exercise.  Follow lipid panel and liver function tests.  

## 2022-11-22 ENCOUNTER — Telehealth: Payer: Self-pay

## 2022-11-22 NOTE — Telephone Encounter (Signed)
-----   Message from Dale [place], MD sent at 11/20/2022  1:57 PM EDT ----- Notify - vitamin D level is a little low.  Need to confirm if taking vitamin D.  If not, then start vitamin D3 1000 units per day.  If taking, need to know dose and when started.  Cholesterol has improved.  Still elevated above goal.  Confirm she is on crestor 10mg  q day.  If so, then increase to 20mg  q day.  Overall sugar control improved.  A1c 5.8.  sodium, thyroid test, hgb, kidney function tests and liver function tests are wnl.

## 2022-11-23 ENCOUNTER — Ambulatory Visit: Payer: 59

## 2022-11-24 ENCOUNTER — Inpatient Hospital Stay (HOSPITAL_BASED_OUTPATIENT_CLINIC_OR_DEPARTMENT_OTHER): Payer: 59 | Admitting: Nurse Practitioner

## 2022-11-24 VITALS — BP 124/78 | HR 71 | Temp 97.6°F | Resp 20 | Wt 189.7 lb

## 2022-11-24 DIAGNOSIS — C541 Malignant neoplasm of endometrium: Secondary | ICD-10-CM

## 2022-11-24 DIAGNOSIS — N898 Other specified noninflammatory disorders of vagina: Secondary | ICD-10-CM

## 2022-11-24 DIAGNOSIS — R1900 Intra-abdominal and pelvic swelling, mass and lump, unspecified site: Secondary | ICD-10-CM | POA: Diagnosis not present

## 2022-11-24 DIAGNOSIS — R978 Other abnormal tumor markers: Secondary | ICD-10-CM | POA: Diagnosis not present

## 2022-11-24 LAB — SURGICAL PATHOLOGY

## 2022-11-24 NOTE — Progress Notes (Signed)
Gynecologic Oncology Interval Visit   Referring Provider: Dr. Dalbert Garnet  Chief Complaint: endometrial cancer  Subjective:  Margaret Mercado is a 67 y.o. female who is seen in consultation from Dr. Dalbert Garnet for adnexal mass and elevated ca 125. She has declined chemotherapy or radiation and is doing an alternative treatment. Her CA125 on 11/15/22 was 11.6.   At her appointment with Dr Sonia Side on 11/17/22, 5 mm of vaginal cuff was visualized. This was biopsied and treated with silver nitrate. She presents today for recheck. Her continues to feel well and denies complaints. No vaginal bleeding or pain.      Gynecologic Oncology History  Margaret Mercado is a pleasant female who is seen in consultation from Dr. Dalbert Garnet for adnexal mass and elevated ca 125.   Patient was initially seen by PCP for abdominal pain that was thought to be related to a known hernia. 06/11/22 CT showed adnexal mass on right measuring 14.5 x 9.1 cm with fluid filled serpiginous tubular structure. Possible focal fluid in the fundal portion of the endometrial canal. Large fat containing ventral hernia with a 2cm aperture width.  06/11/2022 CT A/P IMPRESSION: 1. Large cystic lesion in the right adnexa measuring 14.5 x 9.1 cm with a associated fluid-filled serpiginous tubular structure. Findings are favored to reflect a large right ovarian cystic lesion with hydrosalpinx. Suggest Ob-gyn consult and further evaluation by pelvic MRI with and without contrast. 2. Mild prominence of the right collecting system and ureter to the level of the large cystic lesion in the right adnexa but with symmetric enhancement and excretion of contrast indicative of a minimally obstructive process. 3. Large fat containing ventral hernia with a 2 cm aperture width. 4.  Aortic Atherosclerosis (ICD10-I70.0).   Lower chest: Solid 3 mm subpleural left lower lobe pulmonary nodule on image 6/3 stable dating back to March 11, 2005 compatible with a benign  finding. Vascular/Lymphatic: Aortic atherosclerosis. No pathologically enlarged abdominal pelvic lymph nodes.  She was seen by Dr. Dalbert Garnet.   06/18/22- CA 125: 298.  HbA1c - 6.1  07/07/22- US Pelvis Uterus: Measurements: 8.6 x 5.4 x 5.8 cm = volume: 140 mL Endometrium: 10.3 mm.  Right ovary: not visualized. 16.1 x 9.9 x 9.3 cm cystic right adnexal mass incompletely visualized Left ovary: not visualized No abnormal free fluid.   CXR 07/14/2022  IMPRESSION: No active cardiopulmonary disease EKG 07/15/2022 EKG - SR/SB with no acute ischemic changes. Patient notified of results by Dr. Dale Somers.   07/20/2022 She saw Dr. Claudine Mouton and he discussed hernia repair at the time of surgery.   08/04/2022 Exam under anesthesia, Robotic assisted total hysterectomy and bilateral salpingo-oophorectomy with extensive adhesiolysis including enterolysis and ovariolysis, right ureterolysis, and bilateral pelvic node dissection/hernia repair with Dr Sonia Side.   Pathology:  ENDOMETRIOID ADENOCARCINOMA, FIGO GRADE 1; involvement with MYOMETRIUM (20/20 mm), UTERINE SEROSA:RIGHT FALLOPIAN TUBE WITH INVOLVEMENT BY ENDOMETRIOID ADENOCARCINOMA, FAVOR DIRECT EXTENSION FROM UTERINE CORNU. OMENTUM AND BILATERAL PELVIC NODES NEGATIVE FOR MALIGNANCY (0/10, 0/7).  ER (70-80%, moderate), PR (greater than 95%, strong), wild-type (non-mutated) expression of p53.  MLH1: Intact nuclear expression  MSH2: Intact nuclear expression  MSH6: Intact nuclear expression  PMS2: Intact nuclear expression  IHC Interpretation: No loss of nuclear expression of MMR proteins: Low  probability of MSI-H.   A. PELVIC WASHINGS; LIQUID-BASED PREPARATION:  - MALIGNANT CELLS PRESENT.  - COMPATIBLE WITH METASTATIC ADENOCARCINOMA OF GYNECOLOGIC ORIGIN (SEE  [phone]).   She saw Dr. Cathie Hoops on 08/23/2022 and was counseled about chemotherapy. She  has opted not to proceed with chemotherapy. She did not see Radiation Oncology. She is doing an alternative  treatment and would like to have the blood based tumor markers repeated. Her CA125 on 11/15/2022 was 11.6.  Problem List: Patient Active Problem List   Diagnosis Date Noted   Sleep difficulties 11/20/2022   Vitamin D deficiency 11/19/2022   Endometrial cancer (HCC) 08/23/2022   Goals of care, counseling/discussion 08/23/2022   Complex ovarian cyst 08/04/2022   Adnexal mass 07/18/2022   Rash 02/21/2022   Hemorrhoids 02/21/2022   Encounter for completion of form with patient 09/24/2021   Headache 09/23/2021   Expressive aphasia 09/13/2021   Breast cancer screening 02/08/2021   Hematoma 01/18/2021   Leg pain 01/18/2021   Type 2 diabetes mellitus with hyperglycemia (HCC) 05/27/2020   Tricuspid valve insufficiency 03/28/2019   Heart murmur 11/08/2018   History of TIA (transient ischemic attack) 11/08/2018   Abnormal mammogram of right breast 05/03/2018   DOE (dyspnea on exertion) 12/13/2017   Stress 12/13/2017   Hyperglycemia 12/12/2017   Healthcare maintenance 05/17/2016   Abdominal pain, left lower quadrant 08/19/2013   Ventral hernia 08/19/2013   Pyuria 08/19/2013   Sleep apnea 06/17/2012   FLUSHING 02/14/2009   Hyperlipidemia LDL goal <70 01/30/2009   Essential hypertension 01/30/2009   CARDIAC ARRHYTHMIA 01/30/2009   Transient cerebral ischemia 01/30/2009    Past Medical History: Past Medical History:  Diagnosis Date   Abnormal echocardiogram 01/2009   Mild LVH, EF >55%, no regional wall motion abnormalities, normal RV, mild aortic insufficiency, no aortic stenosis.   Adnexal mass    Anxiety    Dyspnea    Heart murmur    Hemorrhoids    Hyperlipidemia    Hypertension    Edema with Norvasc. Unable to tolerate Lisinopril/ HCTZ.   Hypokalemia    Manifested by lip and finger tingling   Hypothyroidism    Palpitations    Event monitor 01/2009 with no significant arrhythmias   Sleep apnea    does not use cpap   TIA (transient ischemic attack) 01/2009   CT and MRI of  head showed no evidence for stroke. Carotid dopplers showed no significant plaque   Transient cerebral ischemia    Tricuspid valve insufficiency    Ventral hernia     Past Surgical History: Past Surgical History:  Procedure Laterality Date   CRYOABLATION     Of uterus   CYSTOSCOPY N/A 08/04/2022   Procedure: CYSTOSCOPY;  Surgeon: Artelia Laroche, MD;  Location: [facility];  Service: Gynecology;  Laterality: N/A;   ROBOTIC ASSISTED TOTAL HYSTERECTOMY WITH BILATERAL SALPINGO OOPHERECTOMY N/A 08/04/2022   Procedure: XI ROBOTIC ASSISTED TOTAL HYSTERECTOMY WITH BILATERAL SALPINGO OOPHORECTOMY;  Surgeon: Artelia Laroche, MD;  Location: [facility];  Service: Gynecology;  Laterality: N/A;   UMBILICAL HERNIA REPAIR  2010   Dr Malissa Hippo   URETHRAL DILATION     removal of bladder polyps   VENTRAL HERNIA REPAIR N/A 08/04/2022   Procedure: HERNIA REPAIR VENTRAL ADULT;  Surgeon: Campbell Lerner, MD;  Location: [facility];  Service: General;  Laterality: N/A;    Past Gynecologic History:  Menarche: age 77 Post menopausal   OB History:  OB History  No obstetric history on file.  G2P2  Family History: Family History  Problem Relation Age of Onset   Hypertension Mother    Arrhythmia Mother        Atrial fibrillation   Hypertension Father    Valvular heart disease Father  Hypertension Brother    Lung cancer Maternal Uncle    Hypertension Paternal Grandmother    Coronary artery disease Neg Hx        Premature   Breast cancer Neg Hx    Colon cancer Neg Hx     Social History: Social History   Socioeconomic History   Marital status: Married    Spouse name: Not on file   Number of children: 2   Years of education: Not on file   Highest education level: Not on file  Occupational History   Occupation: Sports administrator    Comment: full time  Tobacco Use   Smoking status: Never    Passive exposure: Never   Smokeless tobacco: Never  Vaping Use   Vaping Use: Never used   Substance and Sexual Activity   Alcohol use: No    Alcohol/week: 0.0 standard drinks of alcohol   Drug use: No   Sexual activity: Yes    Birth control/protection: None  Other Topics Concern   Not on file  Social History Narrative   Married   Gets regular exercise   Social Determinants of Health   Financial Resource Strain: Not on file  Food Insecurity: Not on file  Transportation Needs: Not on file  Physical Activity: Not on file  Stress: Not on file  Social Connections: Not on file  Intimate Partner Violence: Not on file    Allergies: Allergies  Allergen Reactions   Vicodin [Hydrocodone-Acetaminophen] Anaphylaxis   Amlodipine Besylate    Cardizem [Diltiazem Hcl] Other (See Comments)    Flushing    Erythromycin Other (See Comments)    Chest heaviness    Hydrocodone-Acetaminophen    Levaquin [Levofloxacin In D5w] Other (See Comments)    Rapid heart beat   Lisinopril Other (See Comments)    Difficulty breathing and rash   Macrobid [Nitrofurantoin Macrocrystal] Other (See Comments)    Wheezing and headache   Oxycodone-Acetaminophen    Sulfonamide Derivatives Rash     Review of Systems General:  no complaints Skin: no complaints Eyes: no complaints HEENT: no complaints Breasts: no complaints Pulmonary: no complaints Cardiac: no complaints Gastrointestinal: no complaints Genitourinary/Sexual: no complaints Ob/Gyn: no complaints Musculoskeletal: no complaints Hematology: no complaints Neurologic/Psych: no complaints   Objective:  Physical Examination:  BP 124/78   Pulse 71   Temp 97.6 F (36.4 C)   Resp 20   Wt 189 lb 11.2 oz (86 kg)   SpO2 100%   BMI 35.84 kg/m   Performance status:  0  GENERAL: Patient is a well appearing female in no acute distress LUNGS:  normal respiratory effort EXTREMITIES:  No peripheral edema.   NEURO:  Nonfocal. Well oriented.  Appropriate affect.  Pelvic: Exam chaperoned by CMA EGBUS: no lesions Vagina: lesion  at vaginal cuff has resolved. Biopsy site re-treated with AgNO3. Tolerated well. No discharge or bleeding.  BME & RV deferred.   Lab Review Labs on site today: n/a     Assessment:  Margaret Mercado is a 67 y.o. female diagnosed with stage IIIA endometrioid grade 1 endometrial cancer (pMMR, p53wt, ER/PR+)  with positive cytology. Declined therapy; vaginal lesion biopsied and pathology consistent with granulation tissue. Retreated with silver nitrate today. Tolerated well.   Benign ovarian cyst  Symptomatic nonreducible hernia s/p prior hernia repair; no evidence of recurrent hernia on exam  Hypothyroidism, on replacement, asymptomatic  Medical co-morbidities complicating care: Body mass index is 35.84 kg/m. Mild LVH, HTN, h/o TIA 2010  Plan:  Problem List Items Addressed This Visit       Genitourinary   Endometrial cancer (HCC) (Chronic)   Other Visit Diagnoses     Vaginal lesion    -  Primary       Plan for CT C/A/P in 1 month as ordered by Dr Sonia Side. Will repeat ca 125 prior to that visit. She will follow up with Dr Sonia Side for results. If negative, plan to transition to 38month surveillance visits with ca 125.   The patient's diagnosis, an outline of the further diagnostic and laboratory studies which will be required, the recommendation for surgery, and alternatives were discussed with her and her accompanying family members.  All questions were answered to their satisfaction.  A total of 15 minutes were spent with the patient/family today; >50% was spent in education, counseling and coordination of care for stage III endometrial cancer.   Consuello Masse, DNP, AGNP-C, AOCNP [facility] [phone] (clinic)

## 2022-12-03 NOTE — Telephone Encounter (Signed)
Pt returned [place] call. Note below was read to her. Pt stated that she was aware of previous message through my MyChart from 2 weeks ago. She's aware of increasing Crestor and vitamin D3.

## 2022-12-13 ENCOUNTER — Inpatient Hospital Stay: Payer: 59 | Attending: Obstetrics and Gynecology

## 2022-12-13 DIAGNOSIS — Z90722 Acquired absence of ovaries, bilateral: Secondary | ICD-10-CM | POA: Insufficient documentation

## 2022-12-13 DIAGNOSIS — Z90710 Acquired absence of both cervix and uterus: Secondary | ICD-10-CM | POA: Diagnosis not present

## 2022-12-13 DIAGNOSIS — R911 Solitary pulmonary nodule: Secondary | ICD-10-CM | POA: Diagnosis not present

## 2022-12-13 DIAGNOSIS — R971 Elevated cancer antigen 125 [CA 125]: Secondary | ICD-10-CM | POA: Diagnosis not present

## 2022-12-13 DIAGNOSIS — Z79890 Hormone replacement therapy (postmenopausal): Secondary | ICD-10-CM | POA: Insufficient documentation

## 2022-12-13 DIAGNOSIS — E039 Hypothyroidism, unspecified: Secondary | ICD-10-CM | POA: Diagnosis not present

## 2022-12-13 DIAGNOSIS — Z8541 Personal history of malignant neoplasm of cervix uteri: Secondary | ICD-10-CM | POA: Diagnosis not present

## 2022-12-13 DIAGNOSIS — C541 Malignant neoplasm of endometrium: Secondary | ICD-10-CM

## 2022-12-14 ENCOUNTER — Ambulatory Visit
Admission: RE | Admit: 2022-12-14 | Discharge: 2022-12-14 | Disposition: A | Discharge status: Home / Self Care | Payer: 59 | Source: Ambulatory Visit | Attending: Obstetrics and Gynecology | Admitting: Obstetrics and Gynecology

## 2022-12-14 DIAGNOSIS — N898 Other specified noninflammatory disorders of vagina: Secondary | ICD-10-CM | POA: Diagnosis not present

## 2022-12-14 DIAGNOSIS — I700 Atherosclerosis of aorta: Secondary | ICD-10-CM | POA: Diagnosis not present

## 2022-12-14 DIAGNOSIS — C541 Malignant neoplasm of endometrium: Secondary | ICD-10-CM | POA: Diagnosis not present

## 2022-12-14 DIAGNOSIS — K5730 Diverticulosis of large intestine without perforation or abscess without bleeding: Secondary | ICD-10-CM | POA: Diagnosis not present

## 2022-12-14 DIAGNOSIS — C539 Malignant neoplasm of cervix uteri, unspecified: Secondary | ICD-10-CM | POA: Diagnosis not present

## 2022-12-14 MED ORDER — IOHEXOL 300 MG/ML  SOLN
100.0000 mL | Freq: Once | INTRAMUSCULAR | Status: AC | PRN
  Administered 2022-12-14: 100 mL via INTRAVENOUS

## 2022-12-15 ENCOUNTER — Other Ambulatory Visit: Payer: Self-pay

## 2022-12-15 LAB — CA 125: Cancer Antigen (CA) 125: 13.1 U/mL (ref 0.0–38.1)

## 2022-12-20 ENCOUNTER — Other Ambulatory Visit: Payer: Self-pay

## 2022-12-20 MED ORDER — ROSUVASTATIN CALCIUM 20 MG PO TABS
20.0000 mg | ORAL_TABLET | Freq: Every day | ORAL | 1 refills | Status: DC
  Filled 2022-12-20: qty 90, 90d supply, fill #0

## 2022-12-22 ENCOUNTER — Inpatient Hospital Stay (HOSPITAL_BASED_OUTPATIENT_CLINIC_OR_DEPARTMENT_OTHER): Payer: 59 | Admitting: Obstetrics and Gynecology

## 2022-12-22 ENCOUNTER — Encounter: Payer: Self-pay | Admitting: Obstetrics and Gynecology

## 2022-12-22 VITALS — BP 139/73 | HR 78 | Temp 97.6°F | Resp 19 | Wt 189.6 lb

## 2022-12-22 DIAGNOSIS — Z8542 Personal history of malignant neoplasm of other parts of uterus: Secondary | ICD-10-CM | POA: Diagnosis not present

## 2022-12-22 DIAGNOSIS — Z79890 Hormone replacement therapy (postmenopausal): Secondary | ICD-10-CM | POA: Diagnosis not present

## 2022-12-22 DIAGNOSIS — Z90722 Acquired absence of ovaries, bilateral: Secondary | ICD-10-CM | POA: Diagnosis not present

## 2022-12-22 DIAGNOSIS — Z8541 Personal history of malignant neoplasm of cervix uteri: Secondary | ICD-10-CM | POA: Diagnosis not present

## 2022-12-22 DIAGNOSIS — E039 Hypothyroidism, unspecified: Secondary | ICD-10-CM | POA: Diagnosis not present

## 2022-12-22 DIAGNOSIS — R911 Solitary pulmonary nodule: Secondary | ICD-10-CM

## 2022-12-22 DIAGNOSIS — Z08 Encounter for follow-up examination after completed treatment for malignant neoplasm: Secondary | ICD-10-CM

## 2022-12-22 DIAGNOSIS — R971 Elevated cancer antigen 125 [CA 125]: Secondary | ICD-10-CM | POA: Diagnosis not present

## 2022-12-22 DIAGNOSIS — Z90710 Acquired absence of both cervix and uterus: Secondary | ICD-10-CM | POA: Diagnosis not present

## 2022-12-22 DIAGNOSIS — N898 Other specified noninflammatory disorders of vagina: Secondary | ICD-10-CM

## 2022-12-22 NOTE — Progress Notes (Signed)
Gynecologic Oncology Interval Visit   Referring Provider: Dr. Dalbert Garnet  Chief Complaint: endometrial cancer  Subjective:  Margaret Mercado is a 67 y.o. female diagnosed with endometrial cancer, s/p RA-TLH BSO 07/2022, declined chemotherapy or radiation, who returns to clinic for continued surveillance and follow up for previously noted vaginal cuff lesion.   On previous exam she was noted to have possible granulation tissue at vaginal cuff. Spot was treated with silver nitrate, retreated in June 2024.   She saw Dr. Cathie Hoops on 08/23/2022 and was counseled about chemotherapy. She has opted not to proceed with chemotherapy. She did not see Radiation Oncology. She continues an 'alternative treatment' and would like to have the blood based tumor markers repeated.   12/20/22- CT Chest/Abdomen/Pelvis w/ contrast IMPRESSION: 1. Right upper lobe lung 6 mm nodule. Close follow up recommended given the patient's history. 3 mo 2. Diverticulosis. 3. No acute cardiopulmonary process and no acute abdominal or pelvic pathology.   Gynecologic Oncology History  Margaret Mercado is a pleasant female who is seen in consultation from Dr. Dalbert Garnet for adnexal mass and elevated ca 125.   Patient was initially seen by PCP for abdominal pain that was thought to be related to a known hernia. 06/11/22 CT showed adnexal mass on right measuring 14.5 x 9.1 cm with fluid filled serpiginous tubular structure. Possible focal fluid in the fundal portion of the endometrial canal. Large fat containing ventral hernia with a 2cm aperture width.  06/11/2022 CT A/P IMPRESSION: 1. Large cystic lesion in the right adnexa measuring 14.5 x 9.1 cm with a associated fluid-filled serpiginous tubular structure. Findings are favored to reflect a large right ovarian cystic lesion with hydrosalpinx. Suggest Ob-gyn consult and further evaluation by pelvic MRI with and without contrast. 2. Mild prominence of the right collecting system and ureter to the  level of the large cystic lesion in the right adnexa but with symmetric enhancement and excretion of contrast indicative of a minimally obstructive process. 3. Large fat containing ventral hernia with a 2 cm aperture width. 4.  Aortic Atherosclerosis (ICD10-I70.0).   Lower chest: Solid 3 mm subpleural left lower lobe pulmonary nodule on image 6/3 stable dating back to March 11, 2005 compatible with a benign finding. Vascular/Lymphatic: Aortic atherosclerosis. No pathologically enlarged abdominal pelvic lymph nodes.   She was seen by Dr. Dalbert Garnet.   06/18/22- CA 125: 298.  HbA1c - 6.1  07/07/22- US Pelvis Uterus: Measurements: 8.6 x 5.4 x 5.8 cm = volume: 140 mL Endometrium: 10.3 mm.  Right ovary: not visualized. 16.1 x 9.9 x 9.3 cm cystic right adnexal mass incompletely visualized Left ovary: not visualized No abnormal free fluid.   CXR 07/14/2022  IMPRESSION: No active cardiopulmonary disease EKG 07/15/2022 EKG - SR/SB with no acute ischemic changes. Patient notified of results by Dr. Dale Chittenango.   07/20/2022 She saw Dr. Claudine Mouton and he discussed hernia repair at the time of surgery.   08/04/2022 Exam under anesthesia, Robotic assisted total hysterectomy and bilateral salpingo-oophorectomy with extensive adhesiolysis including enterolysis and ovariolysis, right ureterolysis, and bilateral pelvic node dissection/hernia repair   Pathology:  ENDOMETRIOID ADENOCARCINOMA, FIGO GRADE 1; involvement with MYOMETRIUM (20/20 mm), UTERINE SEROSA:RIGHT FALLOPIAN TUBE WITH INVOLVEMENT BY ENDOMETRIOID ADENOCARCINOMA, FAVOR DIRECT EXTENSION FROM UTERINE CORNU. OMENTUM AND BILATERAL PELVIC NODES NEGATIVE FOR MALIGNANCY (0/10, 0/7).  ER (70-80%, moderate), PR (greater than 95%, strong), wild-type (non-mutated) expression of p53.  MLH1: Intact nuclear expression  MSH2: Intact nuclear expression  MSH6: Intact nuclear expression  PMS2: Intact  nuclear expression  IHC Interpretation: No loss of nuclear  expression of MMR proteins: Low  probability of MSI-H.   A. PELVIC WASHINGS; LIQUID-BASED PREPARATION:  - MALIGNANT CELLS PRESENT.  - COMPATIBLE WITH METASTATIC ADENOCARCINOMA OF GYNECOLOGIC ORIGIN (SEE  [phone]).   She opted for no chemotherapy or radiation therapy at this time.    11/17/2022 at routine surveillance visit she was noted to have a vaginal cuff lesion.  Biopsy was performed which was benign.  The site was treated with silver nitrate A.  VAGINAL CUFF; BIOPSY:  - INFLAMED GRANULATION TISSUE WITH DENUDED EPITHELIUM.  - NEGATIVE FOR MALIGNANCY.   Problem List: Patient Active Problem List   Diagnosis Date Noted   Sleep difficulties 11/20/2022   Vitamin D deficiency 11/19/2022   Endometrial cancer (HCC) 08/23/2022   Goals of care, counseling/discussion 08/23/2022   Complex ovarian cyst 08/04/2022   Adnexal mass 07/18/2022   Rash 02/21/2022   Hemorrhoids 02/21/2022   Encounter for completion of form with patient 09/24/2021   Headache 09/23/2021   Expressive aphasia 09/13/2021   Breast cancer screening 02/08/2021   Hematoma 01/18/2021   Leg pain 01/18/2021   Type 2 diabetes mellitus with hyperglycemia (HCC) 05/27/2020   Tricuspid valve insufficiency 03/28/2019   Heart murmur 11/08/2018   History of TIA (transient ischemic attack) 11/08/2018   Abnormal mammogram of right breast 05/03/2018   DOE (dyspnea on exertion) 12/13/2017   Stress 12/13/2017   Hyperglycemia 12/12/2017   Healthcare maintenance 05/17/2016   Abdominal pain, left lower quadrant 08/19/2013   Ventral hernia 08/19/2013   Pyuria 08/19/2013   Sleep apnea 06/17/2012   FLUSHING 02/14/2009   Hyperlipidemia LDL goal <70 01/30/2009   Essential hypertension 01/30/2009   CARDIAC ARRHYTHMIA 01/30/2009   Transient cerebral ischemia 01/30/2009    Past Medical History: Past Medical History:  Diagnosis Date   Abnormal echocardiogram 01/2009   Mild LVH, EF >55%, no regional wall motion abnormalities,  normal RV, mild aortic insufficiency, no aortic stenosis.   Adnexal mass    Anxiety    Dyspnea    Heart murmur    Hemorrhoids    Hyperlipidemia    Hypertension    Edema with Norvasc. Unable to tolerate Lisinopril/ HCTZ.   Hypokalemia    Manifested by lip and finger tingling   Hypothyroidism    Palpitations    Event monitor 01/2009 with no significant arrhythmias   Sleep apnea    does not use cpap   TIA (transient ischemic attack) 01/2009   CT and MRI of head showed no evidence for stroke. Carotid dopplers showed no significant plaque   Transient cerebral ischemia    Tricuspid valve insufficiency    Ventral hernia     Past Surgical History: Past Surgical History:  Procedure Laterality Date   CRYOABLATION     Of uterus   CYSTOSCOPY N/A 08/04/2022   Procedure: CYSTOSCOPY;  Surgeon: Artelia Laroche, MD;  Location: [facility];  Service: Gynecology;  Laterality: N/A;   ROBOTIC ASSISTED TOTAL HYSTERECTOMY WITH BILATERAL SALPINGO OOPHERECTOMY N/A 08/04/2022   Procedure: XI ROBOTIC ASSISTED TOTAL HYSTERECTOMY WITH BILATERAL SALPINGO OOPHORECTOMY;  Surgeon: Artelia Laroche, MD;  Location: [facility];  Service: Gynecology;  Laterality: N/A;   UMBILICAL HERNIA REPAIR  2010   Dr Malissa Hippo   URETHRAL DILATION     removal of bladder polyps   VENTRAL HERNIA REPAIR N/A 08/04/2022   Procedure: HERNIA REPAIR VENTRAL ADULT;  Surgeon: Campbell Lerner, MD;  Location: [facility];  Service: General;  Laterality: N/A;    Past Gynecologic History:  Menarche: age 89 Post menopausal   OB History:  OB History  No obstetric history on file.  G2P2  Family History: Family History  Problem Relation Age of Onset   Hypertension Mother    Arrhythmia Mother        Atrial fibrillation   Hypertension Father    Valvular heart disease Father    Hypertension Brother    Lung cancer Maternal Uncle    Hypertension Paternal Grandmother    Coronary artery disease Neg Hx        Premature    Breast cancer Neg Hx    Colon cancer Neg Hx     Social History: Social History   Socioeconomic History   Marital status: Married    Spouse name: Not on file   Number of children: 2   Years of education: Not on file   Highest education level: Not on file  Occupational History   Occupation: Sports administrator    Comment: full time  Tobacco Use   Smoking status: Never    Passive exposure: Never   Smokeless tobacco: Never  Vaping Use   Vaping Use: Never used  Substance and Sexual Activity   Alcohol use: No    Alcohol/week: 0.0 standard drinks of alcohol   Drug use: No   Sexual activity: Yes    Birth control/protection: None  Other Topics Concern   Not on file  Social History Narrative   Married   Gets regular exercise   Social Determinants of Health   Financial Resource Strain: Not on file  Food Insecurity: Not on file  Transportation Needs: Not on file  Physical Activity: Not on file  Stress: Not on file  Social Connections: Not on file  Intimate Partner Violence: Not on file    Allergies: Allergies  Allergen Reactions   Vicodin [Hydrocodone-Acetaminophen] Anaphylaxis   Amlodipine Besylate    Cardizem [Diltiazem Hcl] Other (See Comments)    Flushing    Erythromycin Other (See Comments)    Chest heaviness    Hydrocodone-Acetaminophen    Levaquin [Levofloxacin In D5w] Other (See Comments)    Rapid heart beat   Lisinopril Other (See Comments)    Difficulty breathing and rash   Macrobid [Nitrofurantoin Macrocrystal] Other (See Comments)    Wheezing and headache   Oxycodone-Acetaminophen    Sulfonamide Derivatives Rash    Review of Systems General:  no complaints Skin: no complaints Eyes: no complaints HEENT: no complaints Breasts: no complaints Pulmonary: no complaints Cardiac: no complaints Gastrointestinal: no complaints Genitourinary/Sexual: no complaints Ob/Gyn: no complaints Musculoskeletal: no complaints Hematology: no  complaints Neurologic/Psych: no complaints    Objective:  Physical Examination:  BP 139/73   Pulse 78   Temp 97.6 F (36.4 C)   Resp 19   Wt 189 lb 9.6 oz (86 kg)   BMI 35.82 kg/m   Performance status:  0  GENERAL: Patient is a well appearing female in no acute distress HEENT:  Atraumatic and normocephalic.  ABDOMEN:  Soft, nontender. Nondistended. No masses/ascites/hernia/or hepatomegaly.  EXTREMITIES:  No peripheral edema.  SKIN: Slight erythema in the umbilicus scattered areas of erythema underlying the pannus NEURO:  Nonfocal. Well oriented.  Appropriate affect.  Pelvic: Exam chaperoned by nursing EGBUS: no lesions Cervix: surgically absent Vagina: No lesions noted or bleeding; no discharge Uterus: surgically absent BME: no palpable masses Rectovaginal: deferred   Lab Review 06/18/22  298 (diagnosis) 11/15/22  11.6 12/13/22-  13.1  Radiologic Imaging: Imaging reviewed per HPI     Assessment:  Margaret Mercado is a 67 y.o. female diagnosed with stage IIIA endometrioid grade 1 endometrial cancer (pMMR, p53wt, ER/PR+)  with positive cytology. Declined therapy; vaginal lesion granulation tissue status post silver nitrate treatment.  No further lesions noted.  Solitary pulmonary nodule  Symptomatic nonreducible hernia s/p prior hernia repair; no evidence of recurrent hernia on exam  Hypothyroidism, on replacement, asymptomatic  Medical co-morbidities complicating care: Body mass index is 35.82 kg/m. Mild LVH, HTN, h/o TIA 2010  Plan:   Problem List Items Addressed This Visit   None Visit Diagnoses     Encounter for follow-up surveillance of endometrial cancer    -  Primary   Vaginal lesion       Solitary pulmonary nodule          She will follow-up in 3 months with CA125. She would like to have CT C/A/P imaging in 6 months rather than 3 months given that CA125 has been a marker for her.   The patient's diagnosis, an outline of the further diagnostic and  laboratory studies which will be required, the recommendation for surgery, and alternatives were discussed with her and her accompanying family members.  All questions were answered to their satisfaction.  A total of 15 minutes were spent with the patient/family today; >50% was spent in education, counseling and coordination of care for stage III endometrial cancer.   Evelena Asa, MD

## 2022-12-27 ENCOUNTER — Encounter: Payer: Self-pay | Admitting: Internal Medicine

## 2022-12-27 MED ORDER — NYSTATIN 100000 UNIT/GM EX POWD
1.0000 | Freq: Two times a day (BID) | CUTANEOUS | 0 refills | Status: AC
  Filled 2022-12-27: qty 30, 15d supply, fill #0

## 2022-12-27 NOTE — Telephone Encounter (Signed)
Ok to refill nystatin powder for her?

## 2022-12-27 NOTE — Telephone Encounter (Signed)
Rx ok'd for nystatin powder and sen in to pharmacy. Confirm ok.  Let us know if need anything or persistent problems.

## 2022-12-28 ENCOUNTER — Other Ambulatory Visit: Payer: Self-pay

## 2023-01-11 ENCOUNTER — Ambulatory Visit: Payer: 59 | Admitting: Internal Medicine

## 2023-03-11 ENCOUNTER — Telehealth: Payer: Self-pay | Admitting: Family Medicine

## 2023-03-11 DIAGNOSIS — B029 Zoster without complications: Secondary | ICD-10-CM

## 2023-03-11 MED ORDER — VALACYCLOVIR HCL 1 G PO TABS: 1000.0000 mg | ORAL_TABLET | Freq: Three times a day (TID) | ORAL | 0 refills | Status: AC

## 2023-03-11 MED ORDER — GABAPENTIN 300 MG PO CAPS: 300.0000 mg | ORAL_CAPSULE | Freq: Two times a day (BID) | ORAL | 0 refills | Status: DC

## 2023-03-11 NOTE — Progress Notes (Signed)
E-visit for Shingles   We are sorry that you are not feeling well. Here is how we plan to help!  Based on what you shared with me it looks like you have shingles.  Shingles or herpes zoster, is a common infection of the nerves.  It is a painful rash caused by the herpes zoster virus.  This is the same virus that causes chickenpox.  After a person has chickenpox, the virus remains inactive in the nerve cells.  Years later, the virus can become active again and travel to the skin.  It typically will appear on one side of the face or body.  Burning or shooting pain, tingling, or itching are early signs of the infection.  Blisters typically scab over in 7 to 10 days and clear up within 2-4 weeks. Shingles is only contagious to people that have never had the chickenpox, the chickenpox vaccine, or anyone who has a compromised immune system.  You should avoid contact with these type of people until your blisters scab over.  I have prescribed Valacyclovir 1g three times daily for 7 days and also Gabapentin 300mg  twice daily as needed for pain   HOME CARE: Apply ice packs (wrapped in a thin towel), cool compresses, or soak in cool bath to help reduce pain. Use calamine lotion to calm itchy skin. Avoid scratching the rash. Avoid direct sunlight.  GET HELP RIGHT AWAY IF: Symptoms that don't away after treatment. A rash or blisters near your eye. Increased drainage, fever, or rash after treatment. Severe pain that doesn't go away.   MAKE SURE YOU   Understand these instructions. Will watch your condition. Will get help right away if you are not doing well or get worse.  Thank you for choosing an e-visit.  Your e-visit answers were reviewed by a board certified advanced clinical practitioner to complete your personal care plan. Depending upon the condition, your plan could have included both over the counter or prescription medications.  Please review your pharmacy choice. Make sure the pharmacy  is open so you can pick up prescription now. If there is a problem, you may contact your provider through [employer] and have the prescription routed to another pharmacy.  Your safety is important to [place]. If you have drug allergies check your prescription carefully.   For the next 24 hours you can use MyChart to ask questions about today's visit, request a non-urgent call back, or ask for a work or school excuse. You will get an email in the next two days asking about your experience. I hope that your e-visit has been valuable and will speed your recovery.    have provided 5 minutes of non face to face time during this encounter for chart review and documentation.

## 2023-03-15 ENCOUNTER — Other Ambulatory Visit: Payer: Self-pay

## 2023-03-21 ENCOUNTER — Inpatient Hospital Stay: Payer: Self-pay

## 2023-03-21 ENCOUNTER — Telehealth: Payer: Self-pay

## 2023-03-21 NOTE — Telephone Encounter (Signed)
Lab/gyn onc appointments cancelled per notification at patient request due to shingles.

## 2023-03-23 ENCOUNTER — Inpatient Hospital Stay: Payer: Self-pay

## 2023-03-29 ENCOUNTER — Encounter: Payer: Self-pay | Admitting: Internal Medicine

## 2023-03-30 NOTE — Telephone Encounter (Signed)
She is scheduled to see me this week 04/01/23.  Can evaluate and see what treatment would be best for her.

## 2023-04-01 ENCOUNTER — Encounter: Payer: Self-pay | Admitting: Internal Medicine

## 2023-04-01 ENCOUNTER — Ambulatory Visit: Payer: Self-pay | Admitting: Internal Medicine

## 2023-04-01 ENCOUNTER — Other Ambulatory Visit: Payer: Self-pay

## 2023-04-01 VITALS — BP 116/68 | HR 61 | Temp 98.0°F | Resp 16 | Ht 61.0 in | Wt 178.0 lb

## 2023-04-01 DIAGNOSIS — R739 Hyperglycemia, unspecified: Secondary | ICD-10-CM

## 2023-04-01 DIAGNOSIS — I10 Essential (primary) hypertension: Secondary | ICD-10-CM

## 2023-04-01 DIAGNOSIS — E785 Hyperlipidemia, unspecified: Secondary | ICD-10-CM

## 2023-04-01 DIAGNOSIS — B029 Zoster without complications: Secondary | ICD-10-CM | POA: Insufficient documentation

## 2023-04-01 DIAGNOSIS — E1165 Type 2 diabetes mellitus with hyperglycemia: Secondary | ICD-10-CM

## 2023-04-01 LAB — CBC WITH DIFFERENTIAL/PLATELET
Basophils Absolute: 0.1 10*3/uL (ref 0.0–0.1)
Basophils Relative: 1 % (ref 0.0–3.0)
Eosinophils Absolute: 0.3 10*3/uL (ref 0.0–0.7)
Eosinophils Relative: 3.2 % (ref 0.0–5.0)
HCT: 40.9 % (ref 36.0–46.0)
Hemoglobin: 13.7 g/dL (ref 12.0–15.0)
Lymphocytes Relative: 18.3 % (ref 12.0–46.0)
Lymphs Abs: 1.7 10*3/uL (ref 0.7–4.0)
MCHC: 33.5 g/dL (ref 30.0–36.0)
MCV: 95.5 fL (ref 78.0–100.0)
Monocytes Absolute: 0.7 10*3/uL (ref 0.1–1.0)
Monocytes Relative: 7.5 % (ref 3.0–12.0)
Neutro Abs: 6.7 10*3/uL (ref 1.4–7.7)
Neutrophils Relative %: 70 % (ref 43.0–77.0)
Platelets: 311 10*3/uL (ref 150.0–400.0)
RBC: 4.28 Mil/uL (ref 3.87–5.11)
RDW: 12.9 % (ref 11.5–15.5)
WBC: 9.6 10*3/uL (ref 4.0–10.5)

## 2023-04-01 LAB — BASIC METABOLIC PANEL
BUN: 37 mg/dL — ABNORMAL HIGH (ref 6–23)
CO2: 24 meq/L (ref 19–32)
Calcium: 10 mg/dL (ref 8.4–10.5)
Chloride: 101 meq/L (ref 96–112)
Creatinine, Ser: 1.5 mg/dL — ABNORMAL HIGH (ref 0.40–1.20)
GFR: 36.24 mL/min — ABNORMAL LOW (ref >60.00)
Glucose, Bld: 105 mg/dL — ABNORMAL HIGH (ref 70–99)
Potassium: 4.5 meq/L (ref 3.5–5.1)
Sodium: 135 meq/L (ref 135–145)

## 2023-04-01 LAB — HEPATIC FUNCTION PANEL
ALT: 7 U/L (ref 0–35)
AST: 11 U/L (ref 0–37)
Albumin: 4.4 g/dL (ref 3.5–5.2)
Alkaline Phosphatase: 45 U/L (ref 39–117)
Bilirubin, Direct: 0 mg/dL (ref 0.0–0.3)
Total Bilirubin: 0.4 mg/dL (ref 0.2–1.2)
Total Protein: 7 g/dL (ref 6.0–8.3)

## 2023-04-01 LAB — LIPID PANEL
Cholesterol: 240 mg/dL — ABNORMAL HIGH (ref 0–200)
HDL: 39 mg/dL — ABNORMAL LOW (ref >39.00)
LDL Cholesterol: 160 mg/dL — ABNORMAL HIGH (ref 0–99)
NonHDL: 201.07
Total CHOL/HDL Ratio: 6
Triglycerides: 203 mg/dL — ABNORMAL HIGH (ref 0.0–149.0)
VLDL: 40.6 mg/dL — ABNORMAL HIGH (ref 0.0–40.0)

## 2023-04-01 LAB — TSH: TSH: 2.66 u[IU]/mL (ref 0.35–5.50)

## 2023-04-01 LAB — HEMOGLOBIN A1C: Hgb A1c MFr Bld: 5.6 % (ref 4.6–6.5)

## 2023-04-01 MED ORDER — GABAPENTIN 300 MG PO CAPS
300.0000 mg | ORAL_CAPSULE | Freq: Three times a day (TID) | ORAL | 1 refills | Status: DC
  Filled 2023-04-01: qty 90, 30d supply, fill #0
  Filled 2023-05-09: qty 90, 30d supply, fill #1

## 2023-04-01 NOTE — Progress Notes (Signed)
Subjective:    Patient ID: Margaret Mercado, female    DOB: 1955-08-04, 67 y.o.   MRN: [id number]  Patient here for  Chief Complaint  Patient presents with   Herpes Zoster    HPI Work in appt - shingles. Diagnosed 03/11/23 - treaed with valtrex. Reports that she is continuing to have increased pain. Pain localized under the right ribs/right upper quadrant - extending around to the back.  No open lesions.  Increased pain.  Decreased appetite.  Taking multiple tylenol and ibuprofen daily.  Breathing stable.  No fever.  No cough or congestion.     Past Medical History:  Diagnosis Date   Abnormal echocardiogram 01/2009   Mild LVH, EF >55%, no regional wall motion abnormalities, normal RV, mild aortic insufficiency, no aortic stenosis.   Adnexal mass    Anxiety    Dyspnea    Heart murmur    Hemorrhoids    Hyperlipidemia    Hypertension    Edema with Norvasc. Unable to tolerate Lisinopril/ HCTZ.   Hypokalemia    Manifested by lip and finger tingling   Hypothyroidism    Palpitations    Event monitor 01/2009 with no significant arrhythmias   Sleep apnea    does not use cpap   TIA (transient ischemic attack) 01/2009   CT and MRI of head showed no evidence for stroke. Carotid dopplers showed no significant plaque   Transient cerebral ischemia    Tricuspid valve insufficiency    Ventral hernia    Past Surgical History:  Procedure Laterality Date   CRYOABLATION     Of uterus   CYSTOSCOPY N/A 08/04/2022   Procedure: CYSTOSCOPY;  Surgeon: Artelia Laroche, MD;  Location: [facility];  Service: Gynecology;  Laterality: N/A;   ROBOTIC ASSISTED TOTAL HYSTERECTOMY WITH BILATERAL SALPINGO OOPHERECTOMY N/A 08/04/2022   Procedure: XI ROBOTIC ASSISTED TOTAL HYSTERECTOMY WITH BILATERAL SALPINGO OOPHORECTOMY;  Surgeon: Artelia Laroche, MD;  Location: [facility];  Service: Gynecology;  Laterality: N/A;   UMBILICAL HERNIA REPAIR  2010   Dr Malissa Hippo   URETHRAL DILATION     removal of  bladder polyps   VENTRAL HERNIA REPAIR N/A 08/04/2022   Procedure: HERNIA REPAIR VENTRAL ADULT;  Surgeon: Campbell Lerner, MD;  Location: [facility];  Service: General;  Laterality: N/A;   Family History  Problem Relation Age of Onset   Hypertension Mother    Arrhythmia Mother        Atrial fibrillation   Hypertension Father    Valvular heart disease Father    Hypertension Brother    Lung cancer Maternal Uncle    Hypertension Paternal Grandmother    Coronary artery disease Neg Hx        Premature   Breast cancer Neg Hx    Colon cancer Neg Hx    Social History   Socioeconomic History   Marital status: Married    Spouse name: Not on file   Number of children: 2   Years of education: Not on file   Highest education level: Not on file  Occupational History   Occupation: Sports administrator    Comment: full time  Tobacco Use   Smoking status: Never    Passive exposure: Never   Smokeless tobacco: Never  Vaping Use   Vaping status: Never Used  Substance and Sexual Activity   Alcohol use: No    Alcohol/week: 0.0 standard drinks of alcohol   Drug use: No   Sexual activity: Yes  Birth control/protection: None  Other Topics Concern   Not on file  Social History Narrative   Married   Gets regular exercise   Social Determinants of Health   Financial Resource Strain: Not on file  Food Insecurity: Not on file  Transportation Needs: Not on file  Physical Activity: Not on file  Stress: Not on file  Social Connections: Not on file     Review of Systems  Constitutional:  Positive for appetite change. Negative for fever.  HENT:  Negative for congestion and sinus pressure.   Respiratory:  Negative for cough, chest tightness and shortness of breath.   Cardiovascular:  Negative for palpitations and leg swelling.       Right lower anterior chest/rib pain.    Gastrointestinal:  Negative for abdominal pain, diarrhea, nausea and vomiting.  Genitourinary:  Negative for  difficulty urinating and dysuria.  Musculoskeletal:  Negative for joint swelling and myalgias.  Skin:  Negative for color change.       Skin changes - following dermatome - right lower ant rib radiating around to mid back. No open lesions.   Neurological:  Negative for dizziness and headaches.  Psychiatric/Behavioral:  Negative for agitation and dysphoric mood.        Objective:     BP 116/68   Pulse 61   Temp 98 F (36.7 C)   Resp 16   Ht 5\' 1"  (1.549 m)   Wt 178 lb (80.7 kg)   SpO2 98%   BMI 33.63 kg/m  Wt Readings from Last 3 Encounters:  04/01/23 178 lb (80.7 kg)  12/22/22 189 lb 9.6 oz (86 kg)  11/24/22 189 lb 11.2 oz (86 kg)    Physical Exam Vitals reviewed.  Constitutional:      General: She is not in acute distress.    Appearance: Normal appearance.  HENT:     Head: Normocephalic and atraumatic.     Right Ear: External ear normal.     Left Ear: External ear normal.  Eyes:     General: No scleral icterus.       Right eye: No discharge.        Left eye: No discharge.     Conjunctiva/sclera: Conjunctivae normal.  Neck:     Thyroid: No thyromegaly.  Cardiovascular:     Rate and Rhythm: Normal rate and regular rhythm.  Pulmonary:     Effort: No respiratory distress.     Breath sounds: Normal breath sounds. No wheezing.  Abdominal:     General: Bowel sounds are normal.     Palpations: Abdomen is soft.     Tenderness: There is no abdominal tenderness.  Musculoskeletal:        General: No swelling or tenderness.     Cervical back: Neck supple. No tenderness.  Lymphadenopathy:     Cervical: No cervical adenopathy.  Skin:    Findings: No erythema.     Comments: Residual rash - no open lesions - following dermatome - right lower ant rib radiating to mid back   Neurological:     Mental Status: She is alert.  Psychiatric:        Mood and Affect: Mood normal.        Behavior: Behavior normal.      Outpatient Encounter Medications as of 04/01/2023   Medication Sig   gabapentin (NEURONTIN) 300 MG capsule Take 1 capsule (300 mg total) by mouth 3 (three) times daily.   aspirin 81 MG EC tablet Take 81 mg  by mouth daily.     docusate sodium (COLACE) 100 MG capsule Take 1 capsule (100 mg total) by mouth 2 (two) times daily. To keep stools soft   hydrochlorothiazide (MICROZIDE) 12.5 MG capsule Take 1 capsule (12.5 mg total) by mouth daily.   levothyroxine (SYNTHROID) 50 MCG tablet Take 1 tablet (50 mcg total) by mouth daily. (Patient taking differently: Take 50 mcg by mouth daily before breakfast.)   nebivolol (BYSTOLIC) 2.5 MG tablet Take 0.5 tablets (1.25 mg total) by mouth daily.   nystatin (MYCOSTATIN/NYSTOP) powder Apply 1 Application topically 2 (two) times daily.   rosuvastatin (CRESTOR) 20 MG tablet Take 1 tablet (20 mg total) by mouth daily.   sertraline (ZOLOFT) 50 MG tablet Take 1 tablet (50 mg total) by mouth daily.   traZODone (DESYREL) 50 MG tablet Take 0.5-1 tablets (25-50 mg total) by mouth at bedtime as needed for sleep.   [DISCONTINUED] gabapentin (NEURONTIN) 300 MG capsule Take 1 capsule (300 mg total) by mouth 2 (two) times daily for 10 days.   [DISCONTINUED] HYDROmorphone (DILAUDID) 2 MG tablet Take 0.5 tablets (1 mg total) by mouth every 6 (six) hours as needed for severe pain.   No facility-administered encounter medications on file as of 04/01/2023.     Lab Results  Component Value Date   WBC 6.2 11/19/2022   HGB 13.3 11/19/2022   HCT 39.9 11/19/2022   PLT 248.0 11/19/2022   GLUCOSE 123 (H) 11/19/2022   CHOL 205 (H) 11/19/2022   TRIG 174.0 (H) 11/19/2022   HDL 45.90 11/19/2022   LDLDIRECT 160.0 06/18/2022   LDLCALC 124 (H) 11/19/2022   ALT 10 11/19/2022   AST 14 11/19/2022   NA 140 11/19/2022   K 4.0 11/19/2022   CL 101 11/19/2022   CREATININE 0.77 11/19/2022   BUN 17 11/19/2022   CO2 30 11/19/2022   TSH 2.85 11/19/2022   INR 1.0 02/26/2021   HGBA1C 5.8 11/19/2022   MICROALBUR <0.7 11/19/2022    CT  CHEST ABDOMEN PELVIS W CONTRAST  Result Date: 12/20/2022 CLINICAL DATA:  Cervical cancer EXAM: CT CHEST, ABDOMEN, AND PELVIS WITH CONTRAST TECHNIQUE: Multidetector CT imaging of the chest, abdomen and pelvis was performed following the standard protocol during bolus administration of intravenous contrast. RADIATION DOSE REDUCTION: This exam was performed according to the departmental dose-optimization program which includes automated exposure control, adjustment of the mA and/or kV according to patient size and/or use of iterative reconstruction technique. CONTRAST:  OMNIPAQUE IOHEXOL 300 MG/ML  SOLN COMPARISON:  06/11/2022. FINDINGS: CT CHEST FINDINGS Cardiovascular: No significant vascular findings. Normal heart size. No pericardial effusion. Mediastinum/Nodes: No enlarged mediastinal, hilar, or axillary lymph nodes. Thyroid gland, trachea, and esophagus demonstrate no significant findings. Lungs/Pleura: Right upper lobe lung nodule measuring 6 mm seen on axial image 71. Given the patient's history, consider short interval follow up CT in 3 months as a precaution or as otherwise indicated clinically. No pleural effusion or pneumothorax. No pneumonia or pulmonary edema. Musculoskeletal: No chest wall mass or suspicious bone lesions identified. CT ABDOMEN PELVIS FINDINGS Hepatobiliary: No focal liver abnormality is seen. No gallstones, gallbladder wall thickening, or biliary dilatation. Pancreas: Unremarkable. No pancreatic ductal dilatation or surrounding inflammatory changes. Spleen: Normal in size without focal abnormality. Adrenals/Urinary Tract: Adrenal glands are unremarkable. Kidneys are normal, without renal calculi, focal lesion, or hydronephrosis. Bladder is unremarkable. Stomach/Bowel: Stomach is within normal limits. Appendix is not seen and there is no evidence of appendicitis. No evidence of bowel wall thickening, distention, or  inflammatory changes. Diverticulosis descending and sigmoid colon.  Vascular/Lymphatic: Aortic atherosclerosis. No enlarged abdominal or pelvic lymph nodes. Reproductive: Status post hysterectomy. No adnexal masses. Other: No abdominal wall hernia or abnormality. Anterior abdominal wall postop changes are noted. No abdominopelvic ascites. Musculoskeletal: No acute or significant osseous findings. IMPRESSION: 1. Right upper lobe lung 6 mm nodule. Close follow up recommended given the patient's history. 2. Diverticulosis. 3. No acute cardiopulmonary process and no acute abdominal or pelvic pathology. Electronically Signed   By: Layla Maw M.D.   On: 12/20/2022 20:37       Assessment & Plan:  Herpes zoster without complication Assessment & Plan: Shingles as outlined.  Treated with valtrex. Increased pain.  Discussed.  Start gabapentin 300mg  tid.  Follow.  Call with update.    Type 2 diabetes mellitus with hyperglycemia, without long-term current use of insulin (HCC) -     Hemoglobin A1c  Hyperlipidemia LDL goal <70 Assessment & Plan: Crestor.   Low cholesterol diet and exercise.  Follow lipid panel and liver function tests.    Orders: -     Hepatic function panel -     Lipid panel -     TSH -     CBC with Differential/Platelet  Hyperglycemia  Essential hypertension Assessment & Plan: Taking bystolic. Follow pressures and pulse. Blood pressure as outlined.   Orders: -     Basic metabolic panel  Other orders -     Gabapentin; Take 1 capsule (300 mg total) by mouth 3 (three) times daily.  Dispense: 90 capsule; Refill: 1     Dale [place], MD

## 2023-04-01 NOTE — Assessment & Plan Note (Signed)
Taking bystolic. Follow pressures and pulse. Blood pressure as outlined.

## 2023-04-01 NOTE — Assessment & Plan Note (Signed)
Shingles as outlined.  Treated with valtrex. Increased pain.  Discussed.  Start gabapentin 300mg  tid.  Follow.  Call with update.

## 2023-04-01 NOTE — Assessment & Plan Note (Signed)
Crestor. Low cholesterol diet and exercise.  Follow lipid panel and liver function tests.  

## 2023-04-04 ENCOUNTER — Telehealth: Payer: Self-pay

## 2023-04-04 DIAGNOSIS — R944 Abnormal results of kidney function studies: Secondary | ICD-10-CM

## 2023-04-04 NOTE — Telephone Encounter (Signed)
LM. Needs lab appt 10/22 or 10/23 per Dr Lorin Picket.

## 2023-04-04 NOTE — Telephone Encounter (Signed)
-----   Message from [place] sent at 04/03/2023 11:42 PM EDT ----- Late entry.  Called Margaret Mercado 04/01/23 and notified of lab results.  Kidney function decreased.  Advised her to stop the increased ibuprofen.  Was taking multiple ibuprofen a day.  Also instructed to stay hydrated.  Adjusted gabapentin dose. Informed her of cholesterol results and notified overall sugar control wnl.  Hgb, thyroid test and liver test wnl.  Pt aware.  Needs a f/u met b and urinalysis -  04/05/23 or 04/06/23.  She is aware needs f/u appt, just need to call with appt time.

## 2023-04-05 NOTE — Telephone Encounter (Signed)
Patient was returning phone call. I scheduled her a lab appointment this Thursday at 3:30pm.

## 2023-04-06 ENCOUNTER — Ambulatory Visit: Payer: Self-pay | Admitting: Internal Medicine

## 2023-04-06 ENCOUNTER — Encounter: Payer: Self-pay | Admitting: Internal Medicine

## 2023-04-07 ENCOUNTER — Other Ambulatory Visit (INDEPENDENT_AMBULATORY_CARE_PROVIDER_SITE_OTHER): Payer: Self-pay

## 2023-04-07 DIAGNOSIS — R944 Abnormal results of kidney function studies: Secondary | ICD-10-CM

## 2023-04-07 NOTE — Telephone Encounter (Signed)
Patient aware of below.

## 2023-04-07 NOTE — Telephone Encounter (Signed)
Please notify Margaret Mercado that I am going to contact neurology regarding her persistent increased pain.

## 2023-04-08 LAB — URINALYSIS, ROUTINE W REFLEX MICROSCOPIC
Bilirubin Urine: NEGATIVE
Hgb urine dipstick: NEGATIVE
Ketones, ur: NEGATIVE
Nitrite: NEGATIVE
Specific Gravity, Urine: 1.01 (ref 1.000–1.030)
Total Protein, Urine: NEGATIVE
Urine Glucose: NEGATIVE
Urobilinogen, UA: 0.2 (ref 0.0–1.0)
pH: 5.5 (ref 5.0–8.0)

## 2023-04-08 LAB — BASIC METABOLIC PANEL
BUN: 13 mg/dL (ref 6–23)
CO2: 28 meq/L (ref 19–32)
Calcium: 9.8 mg/dL (ref 8.4–10.5)
Chloride: 96 meq/L (ref 96–112)
Creatinine, Ser: 0.82 mg/dL (ref 0.40–1.20)
GFR: 74.8 mL/min (ref >60.00)
Glucose, Bld: 113 mg/dL — ABNORMAL HIGH (ref 70–99)
Potassium: 4.6 meq/L (ref 3.5–5.1)
Sodium: 133 meq/L — ABNORMAL LOW (ref 135–145)

## 2023-04-11 ENCOUNTER — Telehealth: Payer: Self-pay

## 2023-04-11 NOTE — Telephone Encounter (Signed)
Lvm for pt to give office a call back see message below

## 2023-04-11 NOTE — Telephone Encounter (Signed)
-----   Message from [place] sent at 04/08/2023 12:53 PM EDT ----- Notify - kidney function has improved and back to normal.  Confirm no symptoms of urinary tract infection.  If any symptoms, notify lab to send for culture(will need to be ordered).  If no symptoms, urine ok.

## 2023-04-13 ENCOUNTER — Telehealth: Payer: Self-pay | Admitting: Internal Medicine

## 2023-04-13 DIAGNOSIS — B0229 Other postherpetic nervous system involvement: Secondary | ICD-10-CM

## 2023-04-13 NOTE — Addendum Note (Signed)
Addended by: Charm Barges on: 04/13/2023 05:22 PM   Modules accepted: Orders

## 2023-04-13 NOTE — Telephone Encounter (Signed)
Noted  

## 2023-04-13 NOTE — Telephone Encounter (Signed)
Order placed for referral to physiatry.

## 2023-04-13 NOTE — Telephone Encounter (Signed)
I spoke to neurology.  I can do a referral to physiatry (Dr Yves Dill or Dr Mariah Milling) for possible injection - persistent pain from shingles.  Also recommend lidocaine patch. If desires referral, I can place order for referral.

## 2023-04-13 NOTE — Telephone Encounter (Signed)
Patient aware of below. Agreeable to referral

## 2023-04-13 NOTE — Telephone Encounter (Signed)
Patient states she is returning our call.  I read Dr. Westley Hummer Scott's message to patient.  Patient states she is having no symptoms of urinary tract infection.

## 2023-04-26 ENCOUNTER — Encounter: Payer: Self-pay | Admitting: Internal Medicine

## 2023-04-27 ENCOUNTER — Other Ambulatory Visit: Payer: Self-pay

## 2023-04-27 MED ORDER — NEBIVOLOL HCL 2.5 MG PO TABS
2.5000 mg | ORAL_TABLET | Freq: Every day | ORAL | 1 refills | Status: DC
  Filled 2023-04-27 – 2023-06-21 (×3): qty 90, 90d supply, fill #0
  Filled 2023-10-11: qty 90, 90d supply, fill #1

## 2023-04-27 NOTE — Telephone Encounter (Signed)
Ok to send in bystolic - at the current dose she is taking.  Document dose.  Schedule f/u appt with me.

## 2023-04-27 NOTE — Telephone Encounter (Signed)
LM for patient. Need to confirm how she is taking the bystolic.

## 2023-04-27 NOTE — Telephone Encounter (Signed)
Patient called office back and said she takes 1 bystolic a day, like she did a year ago. And that keeps her around 159/97. She is currently taking Gabapentin and Tylenol for the shingles, as prescribed by Dr Lorin Picket. Patient thinks her bp will go down after she stops taking these medications for the shingles.

## 2023-04-27 NOTE — Telephone Encounter (Signed)
Ok to send in bystolic and schedule a visit with you to discuss her BP staying elevated?

## 2023-05-02 ENCOUNTER — Encounter: Payer: Self-pay | Admitting: Internal Medicine

## 2023-05-02 NOTE — Telephone Encounter (Signed)
 Care team updated and letter sent for eye exam notes.

## 2023-05-09 ENCOUNTER — Other Ambulatory Visit: Payer: Self-pay

## 2023-06-21 ENCOUNTER — Other Ambulatory Visit: Payer: Self-pay

## 2023-06-22 ENCOUNTER — Other Ambulatory Visit: Payer: Self-pay

## 2023-07-01 ENCOUNTER — Ambulatory Visit: Payer: Self-pay | Admitting: Internal Medicine

## 2023-08-11 ENCOUNTER — Ambulatory Visit: Payer: Self-pay | Admitting: Internal Medicine

## 2023-08-24 ENCOUNTER — Ambulatory Visit: Payer: Self-pay | Admitting: Internal Medicine

## 2023-09-06 ENCOUNTER — Ambulatory Visit: Payer: Self-pay | Admitting: Internal Medicine

## 2023-09-15 ENCOUNTER — Ambulatory Visit: Payer: Self-pay | Admitting: Internal Medicine

## 2023-10-11 ENCOUNTER — Other Ambulatory Visit: Payer: Self-pay

## 2023-11-14 ENCOUNTER — Telehealth: Payer: Self-pay | Admitting: Internal Medicine

## 2023-11-14 DIAGNOSIS — Z1231 Encounter for screening mammogram for malignant neoplasm of breast: Secondary | ICD-10-CM

## 2023-11-14 NOTE — Telephone Encounter (Signed)
Received notification that she is overdue mammogram.  Please schedule.   

## 2023-11-15 ENCOUNTER — Encounter: Payer: Self-pay | Admitting: Internal Medicine

## 2023-11-25 NOTE — Telephone Encounter (Signed)
 Left detailed message for patient.

## 2023-11-30 ENCOUNTER — Emergency Department
Admission: EM | Admit: 2023-11-30 | Discharge: 2023-11-30 | Disposition: A | Discharge status: Home / Self Care | Attending: Emergency Medicine | Admitting: Emergency Medicine

## 2023-11-30 ENCOUNTER — Encounter: Payer: Self-pay | Admitting: Emergency Medicine

## 2023-11-30 ENCOUNTER — Other Ambulatory Visit: Payer: Self-pay

## 2023-11-30 DIAGNOSIS — Z7982 Long term (current) use of aspirin: Secondary | ICD-10-CM | POA: Insufficient documentation

## 2023-11-30 DIAGNOSIS — Z79899 Other long term (current) drug therapy: Secondary | ICD-10-CM | POA: Insufficient documentation

## 2023-11-30 DIAGNOSIS — I10 Essential (primary) hypertension: Secondary | ICD-10-CM | POA: Insufficient documentation

## 2023-11-30 DIAGNOSIS — E039 Hypothyroidism, unspecified: Secondary | ICD-10-CM | POA: Diagnosis not present

## 2023-11-30 DIAGNOSIS — T7840XA Allergy, unspecified, initial encounter: Secondary | ICD-10-CM | POA: Insufficient documentation

## 2023-11-30 DIAGNOSIS — R220 Localized swelling, mass and lump, head: Secondary | ICD-10-CM | POA: Diagnosis present

## 2023-11-30 DIAGNOSIS — L03211 Cellulitis of face: Secondary | ICD-10-CM | POA: Insufficient documentation

## 2023-11-30 MED ORDER — DOXYCYCLINE HYCLATE 100 MG PO TABS
100.0000 mg | ORAL_TABLET | Freq: Once | ORAL | Status: AC
  Administered 2023-11-30: 100 mg via ORAL
  Filled 2023-11-30: qty 1

## 2023-11-30 MED ORDER — DIPHENHYDRAMINE HCL 25 MG PO CAPS
50.0000 mg | ORAL_CAPSULE | Freq: Once | ORAL | Status: AC
  Administered 2023-11-30: 50 mg via ORAL
  Filled 2023-11-30: qty 2

## 2023-11-30 MED ORDER — TRIAMCINOLONE ACETONIDE 0.025 % EX OINT
1.0000 | TOPICAL_OINTMENT | Freq: Two times a day (BID) | CUTANEOUS | 0 refills | Status: DC
  Filled 2023-11-30: qty 30, 30d supply, fill #0

## 2023-11-30 MED ORDER — CEPHALEXIN 500 MG PO CAPS
500.0000 mg | ORAL_CAPSULE | Freq: Once | ORAL | Status: AC
  Administered 2023-11-30: 500 mg via ORAL
  Filled 2023-11-30: qty 1

## 2023-11-30 MED ORDER — ONDANSETRON 4 MG PO TBDP
4.0000 mg | ORAL_TABLET | Freq: Four times a day (QID) | ORAL | 0 refills | Status: DC | PRN
  Filled 2023-11-30: qty 18, 21d supply, fill #0

## 2023-11-30 MED ORDER — CEPHALEXIN 500 MG PO CAPS
500.0000 mg | ORAL_CAPSULE | Freq: Four times a day (QID) | ORAL | 0 refills | Status: AC
  Filled 2023-11-30: qty 28, 7d supply, fill #0

## 2023-11-30 MED ORDER — PREDNISONE 20 MG PO TABS
60.0000 mg | ORAL_TABLET | Freq: Once | ORAL | Status: AC
  Administered 2023-11-30: 60 mg via ORAL
  Filled 2023-11-30: qty 3

## 2023-11-30 MED ORDER — ACETAMINOPHEN 500 MG PO TABS
1000.0000 mg | ORAL_TABLET | Freq: Once | ORAL | Status: AC
  Administered 2023-11-30: 1000 mg via ORAL
  Filled 2023-11-30: qty 2

## 2023-11-30 MED ORDER — DOXYCYCLINE HYCLATE 100 MG PO TABS
100.0000 mg | ORAL_TABLET | Freq: Two times a day (BID) | ORAL | 0 refills | Status: DC
  Filled 2023-11-30: qty 14, 7d supply, fill #0

## 2023-11-30 MED ORDER — PREDNISONE 10 MG PO TABS
ORAL_TABLET | ORAL | 0 refills | Status: AC
  Filled 2023-11-30: qty 21, 6d supply, fill #0

## 2023-11-30 NOTE — Discharge Instructions (Addendum)
 You may alternate over the counter Tylenol  1000 mg every 6 hours as needed for pain, fever and Ibuprofen  800 mg every 8 hours as needed for pain, fever.  Please take Ibuprofen  with food.  Do not take more than 4000 mg of Tylenol  (acetaminophen ) in a 24 hour period.  You may take over-the-counter Benadryl 50 mg every 8 hours as needed for itching, swelling.  You may apply ice to your face which can help with discomfort, swelling.

## 2023-11-30 NOTE — ED Triage Notes (Signed)
 Patient ambulatory to triage with steady gait, without difficulty or distress noted; pt reports ?bite last wk to rt side of face; now with increasing pain/redness/swelling

## 2023-11-30 NOTE — ED Provider Notes (Signed)
 [facility] Provider Note    Event Date/Time   First MD Initiated Contact with Patient 11/30/23 [phone]     (approximate)   History   Facial Swelling   HPI  Margaret Mercado is a 68 y.o. female with history of anxiety, hypertension, hyperlipidemia, hypothyroidism who presents to the emergency department with complaints of right-sided facial swelling, discomfort, increased warmth and redness.  States 1 week ago she was hedge trimming bushes and was bitten by mosquitoes and other bugs.  Reports she had bug bites all over her including her face.  She feels like this area to her face has not gotten any better but has gotten progressively worse despite Benadryl cream, oral Benadryl.  She reports she has had cellulitis to her legs before and was worried she could be developing cellulitis today although she denies significant pain to her face although states she is starting to have some pain to the right ear and posterior headache.  Also reports she has felt dizzy that she described as unsteady yesterday and had some shortness of breath but no shortness of breath, chest pain currently no dizziness now or with walking into the ED.  No vomiting or diarrhea.  She denies any history of diabetes.  No difficulty swallowing, speaking or breathing.  No fever.  History provided by patient, husband.    Past Medical History:  Diagnosis Date   Abnormal echocardiogram 01/2009   Mild LVH, EF >55%, no regional wall motion abnormalities, normal RV, mild aortic insufficiency, no aortic stenosis.   Adnexal mass    Anxiety    Dyspnea    Heart murmur    Hemorrhoids    Hyperlipidemia    Hypertension    Edema with Norvasc. Unable to tolerate Lisinopril/ HCTZ.   Hypokalemia    Manifested by lip and finger tingling   Hypothyroidism    Palpitations    Event monitor 01/2009 with no significant arrhythmias   Sleep apnea    does not use cpap   TIA (transient ischemic attack) 01/2009   CT  and MRI of head showed no evidence for stroke. Carotid dopplers showed no significant plaque   Transient cerebral ischemia    Tricuspid valve insufficiency    Ventral hernia     Past Surgical History:  Procedure Laterality Date   CRYOABLATION     Of uterus   CYSTOSCOPY N/A 08/04/2022   Procedure: CYSTOSCOPY;  Surgeon: Nobie Batch, MD;  Location: [facility] ORS;  Service: Gynecology;  Laterality: N/A;   ROBOTIC ASSISTED TOTAL HYSTERECTOMY WITH BILATERAL SALPINGO OOPHERECTOMY N/A 08/04/2022   Procedure: XI ROBOTIC ASSISTED TOTAL HYSTERECTOMY WITH BILATERAL SALPINGO OOPHORECTOMY;  Surgeon: Nobie Batch, MD;  Location: [facility] ORS;  Service: Gynecology;  Laterality: N/A;   UMBILICAL HERNIA REPAIR  2010   Dr Arrie Bienenstock   URETHRAL DILATION     removal of bladder polyps   VENTRAL HERNIA REPAIR N/A 08/04/2022   Procedure: HERNIA REPAIR VENTRAL ADULT;  Surgeon: Flynn Hylan, MD;  Location: [facility] ORS;  Service: General;  Laterality: N/A;    MEDICATIONS:  Prior to Admission medications   Medication Sig Start Date End Date Taking? Authorizing Provider  aspirin  81 MG EC tablet Take 81 mg by mouth daily.      [provider]  docusate sodium  (COLACE) 100 MG capsule Take 1 capsule (100 mg total) by mouth 2 (two) times daily. To keep stools soft 08/05/22   Prescilla Brod, MD  gabapentin  (NEURONTIN ) 300 MG capsule  Take 1 capsule (300 mg total) by mouth 3 (three) times daily. 04/01/23   Dellar Fenton, MD  hydrochlorothiazide  (MICROZIDE ) 12.5 MG capsule Take 1 capsule (12.5 mg total) by mouth daily. 11/19/22   Dellar Fenton, MD  levothyroxine  (SYNTHROID ) 50 MCG tablet Take 1 tablet (50 mcg total) by mouth daily. Patient taking differently: Take 50 mcg by mouth daily before breakfast. 06/21/22   Dellar Fenton, MD  nebivolol  (BYSTOLIC ) 2.5 MG tablet Take 1 tablet (2.5 mg total) by mouth daily. 04/27/23   Dellar Fenton, MD  nystatin  (MYCOSTATIN /NYSTOP ) powder Apply 1 Application  topically 2 (two) times daily. 12/27/22   Dellar Fenton, MD  rosuvastatin  (CRESTOR ) 20 MG tablet Take 1 tablet (20 mg total) by mouth daily. 12/20/22   Dellar Fenton, MD  sertraline  (ZOLOFT ) 50 MG tablet Take 1 tablet (50 mg total) by mouth daily. 11/19/22   Dellar Fenton, MD  traZODone  (DESYREL ) 50 MG tablet Take 0.5-1 tablets (25-50 mg total) by mouth at bedtime as needed for sleep. 11/19/22   Dellar Fenton, MD    Physical Exam   Triage Vital Signs: ED Triage Vitals  Encounter Vitals Group     BP 11/30/23 0606 (!) 162/94     Girls Systolic BP Percentile --      Girls Diastolic BP Percentile --      Boys Systolic BP Percentile --      Boys Diastolic BP Percentile --      Pulse Rate 11/30/23 0606 74     Resp 11/30/23 0606 16     Temp 11/30/23 0606 97.7 F (36.5 C)     Temp Source 11/30/23 0606 Oral     SpO2 11/30/23 0606 98 %     Weight 11/30/23 0601 170 lb (77.1 kg)     Height 11/30/23 0601 5' (1.524 m)     Head Circumference --      Peak Flow --      Pain Score 11/30/23 0601 1     Pain Loc --      Pain Education --      Exclude from Growth Chart --     Most recent vital signs: Vitals:   11/30/23 0606 11/30/23 0630  BP: (!) 162/94 (!) 109/94  Pulse: 74 74  Resp: 16 16  Temp: 97.7 F (36.5 C) 97.9 F (36.6 C)  SpO2: 98% 97%    CONSTITUTIONAL: Alert, responds appropriately to questions. Well-appearing; well-nourished HEAD: Normocephalic, atraumatic EYES: Conjunctivae clear, pupils appear equal, sclera nonicteric ENT: normal nose; moist mucous membranes, patient has increased redness, warmth and mild soft tissue swelling noted to the right side of the face, neck compared to the left.  No signs of parotitis, sialoadenitis.  No cervical lymphadenopathy appreciated.  Minimal swelling around the tragus of the right ear but normal external auditory canal, TM on the right. No pharyngeal erythema or petechiae, no tonsillar hypertrophy or exudate, no uvular deviation, no  unilateral swelling in posterior oropharynx, no trismus or drooling, no muffled voice, normal phonation, no stridor, airway patent.  No angioedema.  Airway patent. NECK: Supple, normal ROM, trachea midline, no thyromegaly CARD: RRR; S1 and S2 appreciated RESP: Normal chest excursion without splinting or tachypnea; breath sounds clear and equal bilaterally; no wheezes, no rhonchi, no rales, no hypoxia or respiratory distress, speaking full sentences ABD/GI: Non-distended; soft, non-tender, no rebound, no guarding, no peritoneal signs BACK: The back appears normal EXT: Normal ROM in all joints; no deformity noted, no edema SKIN: Normal color for age and  race; warm; scattered erythematous macular papular rash to her chest, arms, extremities.  No blisters or desquamation.  No rash involving the palms or mucous membranes. NEURO: Moves all extremities equally, normal speech PSYCH: The patient's mood and manner are appropriate.   Face/neck   LEFT antecubital fossa   Bilateral ankles  Patient gave verbal permission to utilize photo for medical documentation only. The image was not stored on any personal device.   ED Results / Procedures / Treatments   LABS: (all labs ordered are listed, but only abnormal results are displayed) Labs Reviewed - No data to display   EKG:  EKG Interpretation Date/Time:    Ventricular Rate:    PR Interval:    QRS Duration:    QT Interval:    QTC Calculation:   R Axis:      Text Interpretation:           RADIOLOGY: My personal review and interpretation of imaging:    I have personally reviewed all radiology reports.   No results found.   PROCEDURES:  Critical Care performed: No     Procedures    IMPRESSION / MDM / ASSESSMENT AND PLAN / ED COURSE  I reviewed the triage vital signs and the nursing notes.    Patient here with facial swelling, redness, warmth.  The patient is on the cardiac monitor to evaluate for evidence of  arrhythmia and/or significant heart rate changes.   DIFFERENTIAL DIAGNOSIS (includes but not limited to):   Allergic reaction, developing facial cellulitis, impetigo, doubt parotitis, sialoadenitis, dental abscess, Ludwig's angina, anaphylaxis, SJS, TEN, SSSS, RMSF   Patient's presentation is most consistent with acute complicated illness / injury requiring diagnostic workup.   PLAN: Patient here with likely allergic reaction that may have now developed into a facial cellulitis.  She has no airway involvement.  She is afebrile nondiabetic.  Hemodynamically stable.  She does report having occasional dizziness and shortness of breath but none currently.  I have offered further workup including labs, EKG, chest x-ray and possible CT of the face however she declines.  No sign of abscess on exam, parotitis, sialoadenitis.  Patient states she would like to try oral medications and then if symptoms or not improving she states she will return to see her PCP or come back to the ER.  Recommended Tylenol , Motrin  for pain control.  Will start her on Keflex  and doxycycline for broad coverage and Benadryl, steroid taper as well as triamcinolone cream.  Recommended triamcinolone only twice a day for the next week given that it will be placed on her face but I feel that she may need something stronger than hydrocortisone.  She may also apply the triamcinolone cream to the rash noted to her extremities, chest.   MEDICATIONS GIVEN IN ED: Medications  acetaminophen  (TYLENOL ) tablet 1,000 mg (1,000 mg Oral Given 11/30/23 0636)  cephALEXin  (KEFLEX ) capsule 500 mg (500 mg Oral Given 11/30/23 0635)  doxycycline (VIBRA-TABS) tablet 100 mg (100 mg Oral Given 11/30/23 0635)  diphenhydrAMINE (BENADRYL) capsule 50 mg (50 mg Oral Given 11/30/23 0637)  predniSONE (DELTASONE) tablet 60 mg (60 mg Oral Given 11/30/23 0636)     ED COURSE: Patient offered further workup multiple times but she declines.  Will discharge with  prescriptions.  First doses of medicines given here in the ED.  Recommended close outpatient follow-up.  Patient and husband comfortable with this plan.   At this time, I do not feel there is any life-threatening condition present. I reviewed  all nursing notes, vitals, pertinent previous records.  All lab and urine results, EKGs, imaging ordered have been independently reviewed and interpreted by myself.  I reviewed all available radiology reports from any imaging ordered this visit.  Based on my assessment, I feel the patient is safe to be discharged home without further emergent workup and can continue workup as an outpatient as needed. Discussed all findings, treatment plan as well as usual and customary return precautions.  They verbalize understanding and are comfortable with this plan.  Outpatient follow-up has been provided as needed.  All questions have been answered.    CONSULTS:  none   OUTSIDE RECORDS REVIEWED: Reviewed last family medicine note in October 2024.       FINAL CLINICAL IMPRESSION(S) / ED DIAGNOSES   Final diagnoses:  Facial cellulitis  Allergic reaction, initial encounter     Rx / DC Orders   ED Discharge Orders          Ordered    cephALEXin  (KEFLEX ) 500 MG capsule  4 times daily        11/30/23 0651    doxycycline (VIBRA-TABS) 100 MG tablet  2 times daily        11/30/23 0651    predniSONE (STERAPRED UNI-PAK 21 TAB) 10 MG (21) TBPK tablet        11/30/23 0651    ondansetron  (ZOFRAN -ODT) 4 MG disintegrating tablet  Every 6 hours PRN        11/30/23 0651    triamcinolone (KENALOG) 0.025 % ointment  2 times daily        11/30/23 1610             Note:  This document was prepared using Dragon voice recognition software and may include unintentional dictation errors.   Norlan Rann, Clover Dao, DO 11/30/23 [phone]

## 2023-12-27 ENCOUNTER — Encounter: Payer: Self-pay | Admitting: Internal Medicine

## 2024-04-26 ENCOUNTER — Other Ambulatory Visit: Payer: Self-pay | Admitting: Internal Medicine

## 2024-04-26 ENCOUNTER — Other Ambulatory Visit: Payer: Self-pay

## 2024-04-26 ENCOUNTER — Ambulatory Visit: Admitting: Internal Medicine

## 2024-04-26 VITALS — BP 142/84 | HR 75 | Temp 97.6°F | Ht 60.0 in | Wt 187.5 lb

## 2024-04-26 DIAGNOSIS — R413 Other amnesia: Secondary | ICD-10-CM | POA: Diagnosis not present

## 2024-04-26 DIAGNOSIS — Z1231 Encounter for screening mammogram for malignant neoplasm of breast: Secondary | ICD-10-CM | POA: Diagnosis not present

## 2024-04-26 DIAGNOSIS — E1165 Type 2 diabetes mellitus with hyperglycemia: Secondary | ICD-10-CM

## 2024-04-26 DIAGNOSIS — K649 Unspecified hemorrhoids: Secondary | ICD-10-CM

## 2024-04-26 DIAGNOSIS — E039 Hypothyroidism, unspecified: Secondary | ICD-10-CM | POA: Diagnosis not present

## 2024-04-26 DIAGNOSIS — E559 Vitamin D deficiency, unspecified: Secondary | ICD-10-CM | POA: Diagnosis not present

## 2024-04-26 DIAGNOSIS — E785 Hyperlipidemia, unspecified: Secondary | ICD-10-CM

## 2024-04-26 DIAGNOSIS — I10 Essential (primary) hypertension: Secondary | ICD-10-CM

## 2024-04-26 DIAGNOSIS — R739 Hyperglycemia, unspecified: Secondary | ICD-10-CM

## 2024-04-26 DIAGNOSIS — R944 Abnormal results of kidney function studies: Secondary | ICD-10-CM

## 2024-04-26 DIAGNOSIS — C541 Malignant neoplasm of endometrium: Secondary | ICD-10-CM

## 2024-04-26 DIAGNOSIS — Z1211 Encounter for screening for malignant neoplasm of colon: Secondary | ICD-10-CM

## 2024-04-26 DIAGNOSIS — N9489 Other specified conditions associated with female genital organs and menstrual cycle: Secondary | ICD-10-CM

## 2024-04-26 LAB — LIPID PANEL
Cholesterol: 252 mg/dL — ABNORMAL HIGH (ref 0–200)
HDL: 49.2 mg/dL (ref >39.00)
LDL Cholesterol: 159 mg/dL — ABNORMAL HIGH (ref 0–99)
NonHDL: 202.48
Total CHOL/HDL Ratio: 5
Triglycerides: 218 mg/dL — ABNORMAL HIGH (ref 0.0–149.0)
VLDL: 43.6 mg/dL — ABNORMAL HIGH (ref 0.0–40.0)

## 2024-04-26 LAB — VITAMIN B12: Vitamin B-12: 430 pg/mL (ref 211–911)

## 2024-04-26 LAB — HEPATIC FUNCTION PANEL
ALT: 15 U/L (ref 0–35)
AST: 15 U/L (ref 0–37)
Albumin: 4.5 g/dL (ref 3.5–5.2)
Alkaline Phosphatase: 48 U/L (ref 39–117)
Bilirubin, Direct: 0.1 mg/dL (ref 0.0–0.3)
Total Bilirubin: 0.6 mg/dL (ref 0.2–1.2)
Total Protein: 7 g/dL (ref 6.0–8.3)

## 2024-04-26 LAB — MICROALBUMIN / CREATININE URINE RATIO
Creatinine,U: 128.6 mg/dL
Microalb Creat Ratio: 8.7 mg/g (ref 0.0–30.0)
Microalb, Ur: 1.1 mg/dL (ref 0.0–1.9)

## 2024-04-26 LAB — BASIC METABOLIC PANEL WITH GFR
BUN: 17 mg/dL (ref 6–23)
CO2: 30 meq/L (ref 19–32)
Calcium: 9.9 mg/dL (ref 8.4–10.5)
Chloride: 100 meq/L (ref 96–112)
Creatinine, Ser: 0.79 mg/dL (ref 0.40–1.20)
GFR: 77.64 mL/min (ref >60.00)
Glucose, Bld: 102 mg/dL — ABNORMAL HIGH (ref 70–99)
Potassium: 4.3 meq/L (ref 3.5–5.1)
Sodium: 139 meq/L (ref 135–145)

## 2024-04-26 LAB — CBC WITH DIFFERENTIAL/PLATELET
Basophils Absolute: 0.1 K/uL (ref 0.0–0.1)
Basophils Relative: 0.8 % (ref 0.0–3.0)
Eosinophils Absolute: 0.2 K/uL (ref 0.0–0.7)
Eosinophils Relative: 2.8 % (ref 0.0–5.0)
HCT: 39.1 % (ref 36.0–46.0)
Hemoglobin: 13.4 g/dL (ref 12.0–15.0)
Lymphocytes Relative: 31.2 % (ref 12.0–46.0)
Lymphs Abs: 2 K/uL (ref 0.7–4.0)
MCHC: 34.2 g/dL (ref 30.0–36.0)
MCV: 94.2 fl (ref 78.0–100.0)
Monocytes Absolute: 0.5 K/uL (ref 0.1–1.0)
Monocytes Relative: 8.2 % (ref 3.0–12.0)
Neutro Abs: 3.7 K/uL (ref 1.4–7.7)
Neutrophils Relative %: 57 % (ref 43.0–77.0)
Platelets: 256 K/uL (ref 150.0–400.0)
RBC: 4.15 Mil/uL (ref 3.87–5.11)
RDW: 13.3 % (ref 11.5–15.5)
WBC: 6.5 K/uL (ref 4.0–10.5)

## 2024-04-26 LAB — HEMOGLOBIN A1C: Hgb A1c MFr Bld: 6 % (ref 4.6–6.5)

## 2024-04-26 LAB — VITAMIN D 25 HYDROXY (VIT D DEFICIENCY, FRACTURES): VITD: 22.38 ng/mL — ABNORMAL LOW (ref 30.00–100.00)

## 2024-04-26 LAB — HM DIABETES FOOT EXAM

## 2024-04-26 LAB — TSH: TSH: 4.87 u[IU]/mL (ref 0.35–5.50)

## 2024-04-26 MED ORDER — HYDROCHLOROTHIAZIDE 12.5 MG PO CAPS
12.5000 mg | ORAL_CAPSULE | Freq: Every day | ORAL | 1 refills | Status: AC
  Filled 2024-04-26: qty 30, 30d supply, fill #0

## 2024-04-26 MED ORDER — NEBIVOLOL HCL 2.5 MG PO TABS
2.5000 mg | ORAL_TABLET | Freq: Every day | ORAL | 1 refills | Status: AC
  Filled 2024-04-26: qty 90, 90d supply, fill #0

## 2024-04-26 NOTE — Progress Notes (Signed)
 Subjective:    Patient ID: Margaret Mercado, female    DOB: 1955/11/27, 68 y.o.   MRN: [id number]  Patient here for  Chief Complaint  Patient presents with   Hypertension    HPI Here for a scheduled follow up - follow up regarding hypertension, diabetes and hypercholesterolemia. Was on bystolic . Out of medication now. Last seen 03/2023. Has been out of medication for 2 months. Drinking herbal tea and taking beet chews. Has been monitoring her blood pressure - reports averaging 140-145/85. No chest pain or sob reported. Intermittent diarrhea. Some occasional hemorrhoids - occasional bleeding hemorrhoids - notices some occasional blood with wiping.    Past Medical History:  Diagnosis Date   Abnormal echocardiogram 01/2009   Mild LVH, EF >55%, no regional wall motion abnormalities, normal RV, mild aortic insufficiency, no aortic stenosis.   Adnexal mass    Anxiety    Dyspnea    Heart murmur    Hemorrhoids    Hyperlipidemia    Hypertension    Edema with Norvasc. Unable to tolerate Lisinopril/ HCTZ.   Hypokalemia    Manifested by lip and finger tingling   Hypothyroidism    Palpitations    Event monitor 01/2009 with no significant arrhythmias   Sleep apnea    does not use cpap   TIA (transient ischemic attack) 01/2009   CT and MRI of head showed no evidence for stroke. Carotid dopplers showed no significant plaque   Transient cerebral ischemia    Tricuspid valve insufficiency    Ventral hernia    Past Surgical History:  Procedure Laterality Date   CRYOABLATION     Of uterus   CYSTOSCOPY N/A 08/04/2022   Procedure: CYSTOSCOPY;  Surgeon: Elby Webb Loges, MD;  Location: [facility] ORS;  Service: Gynecology;  Laterality: N/A;   ROBOTIC ASSISTED TOTAL HYSTERECTOMY WITH BILATERAL SALPINGO OOPHERECTOMY N/A 08/04/2022   Procedure: XI ROBOTIC ASSISTED TOTAL HYSTERECTOMY WITH BILATERAL SALPINGO OOPHORECTOMY;  Surgeon: Elby Webb Loges, MD;  Location: [facility] ORS;  Service:  Gynecology;  Laterality: N/A;   UMBILICAL HERNIA REPAIR  2010   Dr LELON Sharps   URETHRAL DILATION     removal of bladder polyps   VENTRAL HERNIA REPAIR N/A 08/04/2022   Procedure: HERNIA REPAIR VENTRAL ADULT;  Surgeon: Lane Shope, MD;  Location: [facility] ORS;  Service: General;  Laterality: N/A;   Family History  Problem Relation Age of Onset   Hypertension Mother    Arrhythmia Mother        Atrial fibrillation   Hypertension Father    Valvular heart disease Father    Hypertension Brother    Lung cancer Maternal Uncle    Hypertension Paternal Grandmother    Coronary artery disease Neg Hx        Premature   Breast cancer Neg Hx    Colon cancer Neg Hx    Social History   Socioeconomic History   Marital status: Married    Spouse name: Not on file   Number of children: 2   Years of education: Not on file   Highest education level: Not on file  Occupational History   Occupation: Sports administrator    Comment: full time  Tobacco Use   Smoking status: Never    Passive exposure: Never   Smokeless tobacco: Never  Vaping Use   Vaping status: Never Used  Substance and Sexual Activity   Alcohol use: No    Alcohol/week: 0.0 standard drinks of alcohol   Drug use: No  Sexual activity: Yes    Birth control/protection: None  Other Topics Concern   Not on file  Social History Narrative   Married   Gets regular exercise   Social Drivers of Health   Financial Resource Strain: Not on file  Food Insecurity: Not on file  Transportation Needs: Not on file  Physical Activity: Not on file  Stress: Not on file  Social Connections: Not on file     Review of Systems  Constitutional:  Negative for appetite change and unexpected weight change.  HENT:  Negative for congestion and sinus pressure.   Respiratory:  Negative for cough and chest tightness.        Breathing stable.   Cardiovascular:  Negative for chest pain and palpitations.       No increased swelling.    Gastrointestinal:  Negative for abdominal pain, diarrhea, nausea and vomiting.       Intermittent diarrhea.   Genitourinary:  Negative for difficulty urinating and dysuria.  Musculoskeletal:  Negative for joint swelling and myalgias.  Skin:  Negative for color change and rash.  Neurological:  Negative for dizziness and headaches.  Psychiatric/Behavioral:  Negative for agitation and dysphoric mood.        Objective:     BP (!) 142/84   Pulse 75   Temp 97.6 F (36.4 C) (Oral)   Ht 5' (1.524 m)   Wt 187 lb 8 oz (85 kg)   SpO2 97%   BMI 36.62 kg/m  Wt Readings from Last 3 Encounters:  04/26/24 187 lb 8 oz (85 kg)  11/30/23 170 lb (77.1 kg)  04/01/23 178 lb (80.7 kg)    Physical Exam Vitals reviewed.  Constitutional:      General: She is not in acute distress.    Appearance: Normal appearance.  HENT:     Head: Normocephalic and atraumatic.     Right Ear: External ear normal.     Left Ear: External ear normal.     Mouth/Throat:     Pharynx: No oropharyngeal exudate or posterior oropharyngeal erythema.  Eyes:     General: No scleral icterus.       Right eye: No discharge.        Left eye: No discharge.     Conjunctiva/sclera: Conjunctivae normal.  Neck:     Thyroid : No thyromegaly.  Cardiovascular:     Rate and Rhythm: Normal rate and regular rhythm.  Pulmonary:     Effort: No respiratory distress.     Breath sounds: Normal breath sounds. No wheezing.  Abdominal:     General: Bowel sounds are normal.     Palpations: Abdomen is soft.     Tenderness: There is no abdominal tenderness.  Musculoskeletal:        General: No swelling or tenderness.     Cervical back: Neck supple. No tenderness.  Lymphadenopathy:     Cervical: No cervical adenopathy.  Skin:    Findings: No erythema or rash.  Neurological:     Mental Status: She is alert.  Psychiatric:        Mood and Affect: Mood normal.        Behavior: Behavior normal.         Outpatient Encounter  Medications as of 04/26/2024  Medication Sig   aspirin  81 MG EC tablet Take 81 mg by mouth daily.     nystatin  (MYCOSTATIN /NYSTOP ) powder Apply 1 Application topically 2 (two) times daily.   [DISCONTINUED] nebivolol  (BYSTOLIC ) 2.5 MG tablet Take 1  tablet (2.5 mg total) by mouth daily.   hydrochlorothiazide  (MICROZIDE ) 12.5 MG capsule Take 1 capsule (12.5 mg total) by mouth daily.   levothyroxine  (SYNTHROID ) 50 MCG tablet Take 1 tablet (50 mcg total) by mouth daily. (Patient taking differently: Take 50 mcg by mouth daily before breakfast.)   [DISCONTINUED] docusate sodium  (COLACE) 100 MG capsule Take 1 capsule (100 mg total) by mouth 2 (two) times daily. To keep stools soft (Patient not taking: Reported on 04/26/2024)   [DISCONTINUED] doxycycline  (VIBRA -TABS) 100 MG tablet Take 1 tablet (100 mg total) by mouth 2 (two) times daily.   [DISCONTINUED] gabapentin  (NEURONTIN ) 300 MG capsule Take 1 capsule (300 mg total) by mouth 3 (three) times daily.   [DISCONTINUED] hydrochlorothiazide  (MICROZIDE ) 12.5 MG capsule Take 1 capsule (12.5 mg total) by mouth daily.   [DISCONTINUED] ondansetron  (ZOFRAN -ODT) 4 MG disintegrating tablet Take 1 tablet (4 mg total) by mouth every 6 (six) hours as needed for nausea or vomiting. (Patient not taking: Reported on 04/26/2024)   [DISCONTINUED] rosuvastatin  (CRESTOR ) 20 MG tablet Take 1 tablet (20 mg total) by mouth daily. (Patient not taking: Reported on 04/26/2024)   [DISCONTINUED] sertraline  (ZOLOFT ) 50 MG tablet Take 1 tablet (50 mg total) by mouth daily.   [DISCONTINUED] traZODone  (DESYREL ) 50 MG tablet Take 0.5-1 tablets (25-50 mg total) by mouth at bedtime as needed for sleep. (Patient not taking: Reported on 04/26/2024)   [DISCONTINUED] triamcinolone  (KENALOG ) 0.025 % ointment Apply 1 Application topically 2 (two) times daily. (Patient not taking: Reported on 04/26/2024)   No facility-administered encounter medications on file as of 04/26/2024.     Lab Results   Component Value Date   WBC 6.5 04/26/2024   HGB 13.4 04/26/2024   HCT 39.1 04/26/2024   PLT 256.0 04/26/2024   GLUCOSE 102 (H) 04/26/2024   CHOL 252 (H) 04/26/2024   TRIG 218.0 (H) 04/26/2024   HDL 49.20 04/26/2024   LDLDIRECT 160.0 06/18/2022   LDLCALC 159 (H) 04/26/2024   ALT 15 04/26/2024   AST 15 04/26/2024   NA 139 04/26/2024   K 4.3 04/26/2024   CL 100 04/26/2024   CREATININE 0.79 04/26/2024   BUN 17 04/26/2024   CO2 30 04/26/2024   TSH 4.87 04/26/2024   INR 1.0 02/26/2021   HGBA1C 6.0 04/26/2024   MICROALBUR 1.1 04/26/2024       Assessment & Plan:  Memory change Assessment & Plan: Concern regarding some memory change. Check labs, including routine labs and tsh and B12. Discussed referral to Dr Maree. Agreeable for referral.   Orders: -     Vitamin B12 -     Ambulatory referral to Neurology  Hypothyroidism, unspecified type  Type 2 diabetes mellitus with hyperglycemia, without long-term current use of insulin (HCC) Assessment & Plan: Low carb diet and exercise. Check met b and A1c. Overdue labs.   Orders: -     Microalbumin / creatinine urine ratio -     Hemoglobin A1c  Encounter for screening mammogram for malignant neoplasm of breast Assessment & Plan: Schedule mammogram.   Orders: -     3D Screening Mammogram, Left and Right; Future  Decreased GFR -     Basic metabolic panel with GFR  Hyperlipidemia LDL goal <70 Assessment & Plan: Off crestor . Check lipid panel. Low cholesterol diet and exericse.   Orders: -     CBC with Differential/Platelet -     TSH -     Lipid panel -     Hepatic function panel  Vitamin D  deficiency Assessment & Plan: Check vitamin D  level.   Orders: -     VITAMIN D  25 Hydroxy (Vit-D Deficiency, Fractures)  Colon cancer screening -     Cologuard  Hyperglycemia Assessment & Plan: Low carb diet and exercise. Follow met b and A1c.    Hemorrhoids, unspecified hemorrhoid type Assessment & Plan: Reports  occasional bleeding hemorrhoids. Discussed keeping bowels moving. Follow.    Essential hypertension Assessment & Plan: Restart bystolic . Blood pressure as outlined. Follow pressures. Check metabolic panel.    Endometrial cancer [facility]) Assessment & Plan:  Recently diagnosed with endometrial cancer.  She has opted not to proceed with chemotherapy or radiation therapy.   She is doing an alternative treatment.  She is following an alkaline diet and increased juice and carrots. Etc.  States she feels good.  Previously saw oncology - Dr Babara and Dr Elby.  Discussed.  Elects to continue her current plans for now.  Recent CA 125-  11.6. recheck CA 125.    Orders: -     CA 125  Other orders -     hydroCHLOROthiazide ; Take 1 capsule (12.5 mg total) by mouth daily.  Dispense: 30 capsule; Refill: 1     Allena Hamilton, MD

## 2024-04-27 ENCOUNTER — Ambulatory Visit: Payer: Self-pay | Admitting: Internal Medicine

## 2024-04-27 ENCOUNTER — Encounter: Payer: Self-pay | Admitting: Internal Medicine

## 2024-04-27 LAB — CA 125: CA 125: 12 U/mL (ref <35)

## 2024-04-27 MED ORDER — VITAMIN D (ERGOCALCIFEROL) 1.25 MG (50000 UNIT) PO CAPS
50000.0000 [IU] | ORAL_CAPSULE | ORAL | 1 refills | Status: AC
  Filled 2024-04-27: qty 12, 84d supply, fill #0

## 2024-04-27 NOTE — Telephone Encounter (Signed)
 Pt in agreement for VIT D only

## 2024-04-27 NOTE — Telephone Encounter (Signed)
 See her lab result note.  Let her know her vitamin D  level is low, but it is not extremely low. I have given orders for prescription vitamin D .  Her CA 125 has not resulted yet. This may be a send out lab.

## 2024-04-27 NOTE — Telephone Encounter (Signed)
 Rx sent in for ergocalciferal.  My chart messsage sent.

## 2024-04-28 ENCOUNTER — Other Ambulatory Visit: Payer: Self-pay

## 2024-04-30 ENCOUNTER — Encounter: Payer: Self-pay | Admitting: Internal Medicine

## 2024-04-30 NOTE — Assessment & Plan Note (Signed)
 Low-carb diet and exercise.  Follow met b and A1c.

## 2024-04-30 NOTE — Assessment & Plan Note (Signed)
 Reports occasional bleeding hemorrhoids. Discussed keeping bowels moving. Follow.

## 2024-04-30 NOTE — Assessment & Plan Note (Signed)
 Schedule mammogram.

## 2024-04-30 NOTE — Assessment & Plan Note (Signed)
 Check vitamin D  level

## 2024-04-30 NOTE — Assessment & Plan Note (Signed)
 Low carb diet and exercise. Check met b and A1c. Overdue labs.

## 2024-04-30 NOTE — Assessment & Plan Note (Addendum)
 Concern regarding some memory change. Check labs, including routine labs and tsh and B12. Discussed referral to Dr Maree. Agreeable for referral.

## 2024-04-30 NOTE — Assessment & Plan Note (Signed)
 Restart bystolic . Blood pressure as outlined. Follow pressures. Check metabolic panel.

## 2024-04-30 NOTE — Assessment & Plan Note (Signed)
 Off crestor . Check lipid panel. Low cholesterol diet and exericse.

## 2024-04-30 NOTE — Assessment & Plan Note (Signed)
 Recently diagnosed with endometrial cancer.  She has opted not to proceed with chemotherapy or radiation therapy.   She is doing an alternative treatment.  She is following an alkaline diet and increased juice and carrots. Etc.  States she feels good.  Previously saw oncology - Dr Babara and Dr Elby.  Discussed.  Elects to continue her current plans for now.  Recent CA 125-  11.6. recheck CA 125.
